# Patient Record
Sex: Male | Born: 1935 | Race: White | Hispanic: No | Marital: Married | Smoking: Former smoker
Health system: Southern US, Community
[De-identification: ages and names within clinical notes are randomized; demographics above are authoritative.]

## PROBLEM LIST (undated history)

## (undated) DIAGNOSIS — R51 Headache: Secondary | ICD-10-CM

## (undated) DIAGNOSIS — G4733 Obstructive sleep apnea (adult) (pediatric): Secondary | ICD-10-CM

## (undated) DIAGNOSIS — E7800 Pure hypercholesterolemia, unspecified: Secondary | ICD-10-CM

## (undated) DIAGNOSIS — F419 Anxiety disorder, unspecified: Secondary | ICD-10-CM

## (undated) DIAGNOSIS — G4730 Sleep apnea, unspecified: Secondary | ICD-10-CM

## (undated) DIAGNOSIS — N138 Other obstructive and reflux uropathy: Secondary | ICD-10-CM

## (undated) DIAGNOSIS — M8580 Other specified disorders of bone density and structure, unspecified site: Secondary | ICD-10-CM

## (undated) DIAGNOSIS — N401 Enlarged prostate with lower urinary tract symptoms: Secondary | ICD-10-CM

## (undated) DIAGNOSIS — K5790 Diverticulosis of intestine, part unspecified, without perforation or abscess without bleeding: Secondary | ICD-10-CM

## (undated) DIAGNOSIS — E291 Testicular hypofunction: Secondary | ICD-10-CM

## (undated) DIAGNOSIS — F1010 Alcohol abuse, uncomplicated: Secondary | ICD-10-CM

## (undated) DIAGNOSIS — Z9989 Dependence on other enabling machines and devices: Principal | ICD-10-CM

## (undated) DIAGNOSIS — F32A Depression, unspecified: Secondary | ICD-10-CM

## (undated) DIAGNOSIS — R519 Headache, unspecified: Secondary | ICD-10-CM

## (undated) DIAGNOSIS — I10 Essential (primary) hypertension: Secondary | ICD-10-CM

## (undated) DIAGNOSIS — M1990 Unspecified osteoarthritis, unspecified site: Secondary | ICD-10-CM

## (undated) DIAGNOSIS — F329 Major depressive disorder, single episode, unspecified: Secondary | ICD-10-CM

## (undated) DIAGNOSIS — K635 Polyp of colon: Secondary | ICD-10-CM

## (undated) HISTORY — DX: Polyp of colon: K63.5

## (undated) HISTORY — DX: Other obstructive and reflux uropathy: N13.8

## (undated) HISTORY — PX: EYE SURGERY: SHX253

## (undated) HISTORY — DX: Sleep apnea, unspecified: G47.30

## (undated) HISTORY — DX: Unspecified osteoarthritis, unspecified site: M19.90

## (undated) HISTORY — DX: Depression, unspecified: F32.A

## (undated) HISTORY — PX: SHOULDER SURGERY: SHX246

## (undated) HISTORY — PX: BACK SURGERY: SHX140

## (undated) HISTORY — PX: TONSILLECTOMY: SUR1361

## (undated) HISTORY — DX: Other specified disorders of bone density and structure, unspecified site: M85.80

## (undated) HISTORY — DX: Testicular hypofunction: E29.1

## (undated) HISTORY — DX: Alcohol abuse, uncomplicated: F10.10

## (undated) HISTORY — DX: Other obstructive and reflux uropathy: N40.1

## (undated) HISTORY — DX: Anxiety disorder, unspecified: F41.9

## (undated) HISTORY — DX: Pure hypercholesterolemia, unspecified: E78.00

## (undated) HISTORY — DX: Major depressive disorder, single episode, unspecified: F32.9

## (undated) HISTORY — DX: Obstructive sleep apnea (adult) (pediatric): G47.33

## (undated) HISTORY — DX: Essential (primary) hypertension: I10

## (undated) HISTORY — DX: Dependence on other enabling machines and devices: Z99.89

## (undated) HISTORY — DX: Diverticulosis of intestine, part unspecified, without perforation or abscess without bleeding: K57.90

---
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
## 1998-10-14 ENCOUNTER — Other Ambulatory Visit: Admission: RE | Admit: 1998-10-14 | Discharge: 1998-10-14 | Payer: Self-pay | Admitting: Urology

## 2001-07-26 ENCOUNTER — Other Ambulatory Visit: Admission: RE | Admit: 2001-07-26 | Discharge: 2001-07-26 | Payer: Self-pay | Admitting: Internal Medicine

## 2001-07-26 ENCOUNTER — Encounter (INDEPENDENT_AMBULATORY_CARE_PROVIDER_SITE_OTHER): Payer: Self-pay | Admitting: Specialist

## 2003-02-23 ENCOUNTER — Ambulatory Visit: Admission: RE | Admit: 2003-02-23 | Discharge: 2003-02-23 | Payer: Self-pay | Admitting: Internal Medicine

## 2005-07-16 ENCOUNTER — Encounter: Admission: RE | Admit: 2005-07-16 | Discharge: 2005-07-16 | Payer: Self-pay | Admitting: Internal Medicine

## 2007-06-17 ENCOUNTER — Ambulatory Visit: Payer: Self-pay | Admitting: Internal Medicine

## 2007-07-01 ENCOUNTER — Ambulatory Visit: Payer: Self-pay | Admitting: Internal Medicine

## 2007-07-01 ENCOUNTER — Encounter: Payer: Self-pay | Admitting: Internal Medicine

## 2007-10-05 ENCOUNTER — Encounter: Admission: RE | Admit: 2007-10-05 | Discharge: 2007-10-05 | Payer: Self-pay | Admitting: Internal Medicine

## 2010-03-05 ENCOUNTER — Inpatient Hospital Stay (HOSPITAL_COMMUNITY): Admission: EM | Admit: 2010-03-05 | Discharge: 2010-03-06 | Payer: Self-pay | Admitting: Diagnostic Neuroimaging

## 2010-03-05 ENCOUNTER — Encounter: Payer: Self-pay | Admitting: Emergency Medicine

## 2010-03-06 ENCOUNTER — Encounter (INDEPENDENT_AMBULATORY_CARE_PROVIDER_SITE_OTHER): Payer: Self-pay | Admitting: Diagnostic Neuroimaging

## 2010-06-27 ENCOUNTER — Encounter: Payer: Self-pay | Admitting: Internal Medicine

## 2010-10-28 NOTE — Letter (Signed)
Summary: Letter regarding colonoscopy/Patient  Letter regarding colonoscopy/Patient   Imported By: Sherian Rein 07/04/2010 09:15:40  _____________________________________________________________________  External Attachment:    Type:   Image     Comment:   External Document

## 2010-12-15 LAB — PROTIME-INR
INR: 1.16 (ref 0.00–1.49)
INR: 1.18 (ref 0.00–1.49)
Prothrombin Time: 14.7 seconds (ref 11.6–15.2)
Prothrombin Time: 14.9 seconds (ref 11.6–15.2)

## 2010-12-15 LAB — APTT
aPTT: 34 seconds (ref 24–37)
aPTT: 34 seconds (ref 24–37)

## 2010-12-15 LAB — LIPID PANEL
Cholesterol: 134 mg/dL (ref 0–200)
HDL: 56 mg/dL (ref >39)
LDL Cholesterol: 56 mg/dL (ref 0–99)
Total CHOL/HDL Ratio: 2.4 RATIO
Triglycerides: 109 mg/dL (ref <150)
VLDL: 22 mg/dL (ref 0–40)

## 2010-12-15 LAB — CK TOTAL AND CKMB (NOT AT ARMC)
CK, MB: 1.6 ng/mL (ref 0.3–4.0)
Relative Index: 1.4 (ref 0.0–2.5)
Total CK: 111 U/L (ref 7–232)

## 2010-12-15 LAB — DIFFERENTIAL
Basophils Absolute: 0 10*3/uL (ref 0.0–0.1)
Basophils Relative: 0 % (ref 0–1)
Eosinophils Absolute: 0.3 10*3/uL (ref 0.0–0.7)
Eosinophils Relative: 4 % (ref 0–5)
Lymphocytes Relative: 21 % (ref 12–46)
Lymphs Abs: 1.5 10*3/uL (ref 0.7–4.0)
Monocytes Absolute: 0.8 10*3/uL (ref 0.1–1.0)
Monocytes Relative: 11 % (ref 3–12)
Neutro Abs: 4.7 10*3/uL (ref 1.7–7.7)
Neutrophils Relative %: 64 % (ref 43–77)

## 2010-12-15 LAB — COMPREHENSIVE METABOLIC PANEL
ALT: 18 U/L (ref 0–53)
ALT: 19 U/L (ref 0–53)
AST: 21 U/L (ref 0–37)
AST: 24 U/L (ref 0–37)
Albumin: 3.6 g/dL (ref 3.5–5.2)
Albumin: 3.7 g/dL (ref 3.5–5.2)
Alkaline Phosphatase: 54 U/L (ref 39–117)
Alkaline Phosphatase: 58 U/L (ref 39–117)
BUN: 11 mg/dL (ref 6–23)
BUN: 12 mg/dL (ref 6–23)
CO2: 27 mEq/L (ref 19–32)
CO2: 30 mEq/L (ref 19–32)
Calcium: 8.6 mg/dL (ref 8.4–10.5)
Calcium: 8.9 mg/dL (ref 8.4–10.5)
Chloride: 97 mEq/L (ref 96–112)
Chloride: 99 mEq/L (ref 96–112)
Creatinine, Ser: 0.87 mg/dL (ref 0.4–1.5)
Creatinine, Ser: 0.95 mg/dL (ref 0.4–1.5)
GFR calc Af Amer: >60 mL/min (ref >60)
GFR calc Af Amer: >60 mL/min (ref >60)
GFR calc non Af Amer: >60 mL/min (ref >60)
GFR calc non Af Amer: >60 mL/min (ref >60)
Glucose, Bld: 180 mg/dL — ABNORMAL HIGH (ref 70–99)
Glucose, Bld: 94 mg/dL (ref 70–99)
Potassium: 3.5 mEq/L (ref 3.5–5.1)
Potassium: 3.7 mEq/L (ref 3.5–5.1)
Sodium: 131 mEq/L — ABNORMAL LOW (ref 135–145)
Sodium: 135 mEq/L (ref 135–145)
Total Bilirubin: 0.7 mg/dL (ref 0.3–1.2)
Total Bilirubin: 0.8 mg/dL (ref 0.3–1.2)
Total Protein: 6.4 g/dL (ref 6.0–8.3)
Total Protein: 6.7 g/dL (ref 6.0–8.3)

## 2010-12-15 LAB — URINALYSIS, MICROSCOPIC ONLY
Bilirubin Urine: NEGATIVE
Glucose, UA: NEGATIVE mg/dL
Ketones, ur: NEGATIVE mg/dL
Leukocytes, UA: NEGATIVE
Nitrite: NEGATIVE
Protein, ur: NEGATIVE mg/dL
Specific Gravity, Urine: 1.01 (ref 1.005–1.030)
Urobilinogen, UA: 1 mg/dL (ref 0.0–1.0)
pH: 7 (ref 5.0–8.0)

## 2010-12-15 LAB — RAPID URINE DRUG SCREEN, HOSP PERFORMED
Amphetamines: NOT DETECTED
Barbiturates: NOT DETECTED
Benzodiazepines: POSITIVE — AB
Cocaine: NOT DETECTED
Opiates: NOT DETECTED
Tetrahydrocannabinol: NOT DETECTED

## 2010-12-15 LAB — HEMOGLOBIN A1C
Hgb A1c MFr Bld: 5.8 % — ABNORMAL HIGH (ref <5.7)
Mean Plasma Glucose: 120 mg/dL — ABNORMAL HIGH (ref <117)

## 2010-12-15 LAB — TROPONIN I: Troponin I: 0.01 ng/mL (ref 0.00–0.06)

## 2010-12-15 LAB — CBC
HCT: 42.1 % (ref 39.0–52.0)
HCT: 42.3 % (ref 39.0–52.0)
Hemoglobin: 14.3 g/dL (ref 13.0–17.0)
Hemoglobin: 14.5 g/dL (ref 13.0–17.0)
MCHC: 33.9 g/dL (ref 30.0–36.0)
MCHC: 34.3 g/dL (ref 30.0–36.0)
MCV: 95.8 fL (ref 78.0–100.0)
MCV: 96.5 fL (ref 78.0–100.0)
Platelets: 233 10*3/uL (ref 150–400)
Platelets: 234 10*3/uL (ref 150–400)
RBC: 4.36 MIL/uL (ref 4.22–5.81)
RBC: 4.42 MIL/uL (ref 4.22–5.81)
RDW: 12.6 % (ref 11.5–15.5)
RDW: 12.9 % (ref 11.5–15.5)
WBC: 6.8 10*3/uL (ref 4.0–10.5)
WBC: 7.4 10*3/uL (ref 4.0–10.5)

## 2010-12-15 LAB — GLUCOSE, CAPILLARY
Glucose-Capillary: 103 mg/dL — ABNORMAL HIGH (ref 70–99)
Glucose-Capillary: 118 mg/dL — ABNORMAL HIGH (ref 70–99)
Glucose-Capillary: 86 mg/dL (ref 70–99)
Glucose-Capillary: 92 mg/dL (ref 70–99)
Glucose-Capillary: 98 mg/dL (ref 70–99)

## 2010-12-15 LAB — TSH: TSH: 2.282 u[IU]/mL (ref 0.350–4.500)

## 2010-12-15 LAB — CARDIAC PANEL(CRET KIN+CKTOT+MB+TROPI)
CK, MB: 2 ng/mL (ref 0.3–4.0)
Relative Index: 1.9 (ref 0.0–2.5)
Total CK: 106 U/L (ref 7–232)
Troponin I: 0.01 ng/mL (ref 0.00–0.06)

## 2010-12-15 LAB — VITAMIN B12: Vitamin B-12: 499 pg/mL (ref 211–911)

## 2010-12-15 LAB — HOMOCYSTEINE: Homocysteine: 7.8 umol/L (ref 4.0–15.4)

## 2011-03-17 ENCOUNTER — Encounter (HOSPITAL_COMMUNITY): Payer: Self-pay

## 2011-03-17 ENCOUNTER — Emergency Department (HOSPITAL_COMMUNITY): Payer: Medicare Other

## 2011-03-17 ENCOUNTER — Emergency Department (HOSPITAL_COMMUNITY)
Admission: EM | Admit: 2011-03-17 | Discharge: 2011-03-17 | Disposition: A | Discharge status: Home / Self Care | Payer: Medicare Other | Attending: Emergency Medicine | Admitting: Emergency Medicine

## 2011-03-17 DIAGNOSIS — W010XXA Fall on same level from slipping, tripping and stumbling without subsequent striking against object, initial encounter: Secondary | ICD-10-CM | POA: Insufficient documentation

## 2011-03-17 DIAGNOSIS — Y92009 Unspecified place in unspecified non-institutional (private) residence as the place of occurrence of the external cause: Secondary | ICD-10-CM | POA: Insufficient documentation

## 2011-03-17 DIAGNOSIS — E785 Hyperlipidemia, unspecified: Secondary | ICD-10-CM | POA: Insufficient documentation

## 2011-03-17 DIAGNOSIS — R220 Localized swelling, mass and lump, head: Secondary | ICD-10-CM | POA: Insufficient documentation

## 2011-03-17 DIAGNOSIS — S0010XA Contusion of unspecified eyelid and periocular area, initial encounter: Secondary | ICD-10-CM | POA: Insufficient documentation

## 2011-03-17 DIAGNOSIS — IMO0002 Reserved for concepts with insufficient information to code with codable children: Secondary | ICD-10-CM | POA: Insufficient documentation

## 2011-03-17 DIAGNOSIS — R221 Localized swelling, mass and lump, neck: Secondary | ICD-10-CM | POA: Insufficient documentation

## 2011-03-17 DIAGNOSIS — M47812 Spondylosis without myelopathy or radiculopathy, cervical region: Secondary | ICD-10-CM | POA: Insufficient documentation

## 2011-04-02 ENCOUNTER — Inpatient Hospital Stay (HOSPITAL_COMMUNITY)
Admission: EM | Admit: 2011-04-02 | Discharge: 2011-04-04 | DRG: 556 | Disposition: A | Discharge status: Home / Self Care | Payer: Medicare Other | Attending: Neurology | Admitting: Neurology

## 2011-04-02 ENCOUNTER — Emergency Department (HOSPITAL_COMMUNITY): Payer: Medicare Other

## 2011-04-02 DIAGNOSIS — G5790 Unspecified mononeuropathy of unspecified lower limb: Secondary | ICD-10-CM | POA: Diagnosis present

## 2011-04-02 DIAGNOSIS — H409 Unspecified glaucoma: Secondary | ICD-10-CM | POA: Diagnosis present

## 2011-04-02 DIAGNOSIS — I10 Essential (primary) hypertension: Secondary | ICD-10-CM | POA: Diagnosis present

## 2011-04-02 DIAGNOSIS — M129 Arthropathy, unspecified: Secondary | ICD-10-CM | POA: Diagnosis present

## 2011-04-02 DIAGNOSIS — R5381 Other malaise: Secondary | ICD-10-CM | POA: Diagnosis present

## 2011-04-02 DIAGNOSIS — E785 Hyperlipidemia, unspecified: Secondary | ICD-10-CM | POA: Diagnosis present

## 2011-04-02 DIAGNOSIS — N400 Enlarged prostate without lower urinary tract symptoms: Secondary | ICD-10-CM | POA: Diagnosis present

## 2011-04-02 DIAGNOSIS — R29898 Other symptoms and signs involving the musculoskeletal system: Principal | ICD-10-CM | POA: Diagnosis present

## 2011-04-02 DIAGNOSIS — Z79899 Other long term (current) drug therapy: Secondary | ICD-10-CM

## 2011-04-02 DIAGNOSIS — F341 Dysthymic disorder: Secondary | ICD-10-CM | POA: Diagnosis present

## 2011-04-02 LAB — POCT I-STAT, CHEM 8
Chloride: 94 mEq/L — ABNORMAL LOW (ref 96–112)
Glucose, Bld: 105 mg/dL — ABNORMAL HIGH (ref 70–99)
HCT: 43 % (ref 39.0–52.0)
Hemoglobin: 14.6 g/dL (ref 13.0–17.0)
Potassium: 3.8 mEq/L (ref 3.5–5.1)
Sodium: 132 mEq/L — ABNORMAL LOW (ref 135–145)

## 2011-04-02 LAB — DIFFERENTIAL
Basophils Absolute: 0 10*3/uL (ref 0.0–0.1)
Basophils Relative: 0 % (ref 0–1)
Eosinophils Absolute: 0.2 10*3/uL (ref 0.0–0.7)
Eosinophils Relative: 2 % (ref 0–5)
Lymphocytes Relative: 25 % (ref 12–46)
Monocytes Absolute: 0.8 10*3/uL (ref 0.1–1.0)

## 2011-04-02 LAB — CBC
HCT: 38.8 % — ABNORMAL LOW (ref 39.0–52.0)
MCHC: 35.6 g/dL (ref 30.0–36.0)
Platelets: 206 10*3/uL (ref 150–400)
RDW: 12.4 % (ref 11.5–15.5)
WBC: 9.2 10*3/uL (ref 4.0–10.5)

## 2011-04-02 LAB — URINALYSIS, ROUTINE W REFLEX MICROSCOPIC
Bilirubin Urine: NEGATIVE
Hgb urine dipstick: NEGATIVE
Ketones, ur: NEGATIVE mg/dL
Nitrite: NEGATIVE
Specific Gravity, Urine: 1.016 (ref 1.005–1.030)
Urobilinogen, UA: 0.2 mg/dL (ref 0.0–1.0)

## 2011-04-02 LAB — TROPONIN I: Troponin I: <0.3 ng/mL (ref <0.30)

## 2011-04-02 LAB — CK TOTAL AND CKMB (NOT AT ARMC)
Relative Index: INVALID (ref 0.0–2.5)
Total CK: 61 U/L (ref 7–232)

## 2011-04-03 ENCOUNTER — Inpatient Hospital Stay (HOSPITAL_COMMUNITY): Payer: Medicare Other

## 2011-04-03 LAB — CK: Total CK: 60 U/L (ref 7–232)

## 2011-04-03 LAB — URINE CULTURE
Culture  Setup Time: 201207052054
Culture: NO GROWTH

## 2011-04-03 LAB — VITAMIN B12: Vitamin B-12: 919 pg/mL — ABNORMAL HIGH (ref 211–911)

## 2011-04-21 NOTE — H&P (Signed)
Lance [name], Lance [name]        ACCOUNT NO.:  [id number]  MEDICAL RECORD NO.:  [id number]  LOCATION:  [facility]                         FACILITY:  [facility]  PHYSICIAN:  Levert Feinstein, MD          DATE OF BIRTH:  [birth year]  DATE OF ADMISSION:  04/02/2011 DATE OF DISCHARGE:                             HISTORY & PHYSICAL   CHIEF COMPLAINT:  Increased gait difficulty.  HISTORY OF PRESENT ILLNESS:  The patient is a pleasant 77 year old right- handed Caucasian male, accompanied by his wife at today's emergency visit, who drove him from home to the emergency room this afternoon.  He has past medical history of hypertension, depression, prostate hypertrophy, and he presented with 6 months history of gradual onset gait difficulty, acute worsening for 3 weeks.  Over the past 6 months, wife noticed the patient had unsteady gait, but still ambulatory without assistance.  Three weeks ago, he tripped and fell, landed on his left frontal forehead to a trash can, and also has left fifth finger fracture.  The bruise on the left periorbital area gradually resolved over the past few weeks, and the family and the patient noticed gradual progressive worsening gait difficulty. Yesterday,  he has trouble walking around a grocery store with his wife. Today, he also has difficulty crossing his legs either to the left or right, but he denies significant bilateral lower extremity paresthesia. There was no incontinence.  He has bilateral lower extremity proximal muscle weakness but symmetric.  Denied difficulty getting up from a seated position.  He has chronic low back pain across midline.  There was no shooting pain to lower extremity.  There was no significant neck pain.  REVIEW OF SYSTEMS:  Pertinent as above.  PAST MEDICAL HISTORY:  Depression, anxiety, hyperlipidemia, hypertension, prostate hypertrophy.  HOME MEDICATIONS: 1. Advicor 100/40 mg at bedtime. 2. Bupropion 150 mg twice a day. 3. Combigan  eye drop twice a day. 4. Diazepam 10 mg 3 times a day p.r.n. 5. Doxepin 50 mg at bedtime. 6. Hydrocodone/Tylenol 10/500 as needed. 7. Tamsulosin 0.4 mg twice a day. 8. Tramadol 50 mg as needed. 9. Triamterene/hydrochlorothiazide 37.5/25 once a day. 10.Vitamin D 50,000 units every day.  ALLERGIES:  PENICILLIN, cause anaphylactic shock.  SURGICAL HISTORY:  He had right shoulder surgery, appendectomy.  FAMILY HISTORY:  Father and mother, all died in their 75s for unknown reasons.  Sister suffers scoliosis.  He has three children.  SOCIAL HISTORY:  He is retired, lives with his wife, ambulatory without assistance prior to the hospital, exercise twice a week, and still independent in his living.  PHYSICAL EXAMINATION:  VITAL SIGNS:  Temperature was 98.6, blood pressure 138/81, heart rate of 92. CARDIAC:  Regular rate and rhythm. NEUROLOGIC:  He is alert, oriented to history taking, carry off conversation.  Cranial nerves II-XII.  Pupil equal, round, reactive to light.  Extraocular movements were full.  Facial sensation strength was normal.  Uvula and tongue midline.  Head turning, shoulder shrugging normal and symmetric.  Tongue protrusion into cheek.  Strength was normal.  Visual fields were full on confrontational test.  Motor examination, left wrist is in splint due to left fifth finger fracture. Normal tone, bark, and strength  on bilateral upper extremity.  I do not see any significant weakness on bilateral lower extremity, proximal and distal muscles on manual strength testing.  Sensory, there was lens dependent.  Decreased pinprick to above ankle level, decreased vibratory sensation to knee level, preserved proprioception at bilateral toes. Deep tendon reflex hypoactive but with preserved-to-trace upper extremity patellar reflex.  Plantar responses were flexor.  There was no finger-to-nose, heel-to-shin dysmetria, but he does has difficulty crossing his leg.  Gait, he was able  to get up from seated position arm crossed but with few attempts.  Wide-based, unsteady, cautious gait. Difficulty standing up on tip toe and heels.  Romberg sign was negative.  CT of the head without contrast has demonstrated atrophy, periventricular small-vessel disease, but no acute lesion.  Previous MRI on March 05, 2010, initiated for transient speech difficulty showed similar findings, atrophy, periventricular white matter disease but there was no acute infarction.  MRA of the brain was normal at that time.  Echocardiogram on March 06, 2010, has demonstrated ejection fraction of 50-55, diastolic dysfunction.  Today's laboratory evaluation.  Urinalysis was negative.  Troponin was negative and CPK was 61.  Normal BMP with the exception of mildly low potassium at 3.8, low sodium 138.  CBC was within normal limits.  ASSESSMENT AND PLAN:  A 77 year old male with past medical history of hypertension, hyperlipidemia, knee pain, prostate hypertrophy, chronic gait difficulty for 6 months, increased since his fall 3 weeks ago. There was no significant muscle weakness.  There is lens-dependent sensory change, hyporeflexia, wide-based unsteady gait.  Differentiation diagnosis including deconditioning, need to rule out cervical lumbar compression lesions, it is less likely Guillain-Barre syndrome because of lack of sensory complaints.  1. MRI of cervical and lumbar spine 2. PT/OT. 3. Start home medication.     Levert Feinstein, MD     YY/MEDQ  D:  04/02/2011  T:  04/02/2011  Job:  [id number]  Electronically Signed by Levert Feinstein MD on 04/21/2011 08:34:31 AM

## 2011-04-23 NOTE — Discharge Summary (Signed)
Lance [name], Lance [name]        ACCOUNT NO.:  [id number]  MEDICAL RECORD NO.:  [id number]  LOCATION:  3012                         FACILITY:  [facility]  PHYSICIAN:  Thana Farr, MD    DATE OF BIRTH:  Oct 05, 1931  DATE OF ADMISSION:  04/02/2011 DATE OF DISCHARGE:  04/04/2011                              DISCHARGE SUMMARY   DISCHARGE DIAGNOSES: 1. Lower extremity weakness, resolved. 2. Peripheral neuropathy, bilateral lower extremities. 3. Generalized age-related debility. 4. Hypertension. 5. Hyperlipidemia. 6. Prostatic hypertrophy. 7. Depression. 8. Anxiety. 9. Multijoint arthritic pain. 10.Glaucoma.  DISCHARGE MEDICATIONS: 1. Advicor 1000/40 one p.o. nightly. 2. Bupropion SR 150 mg one p.o. b.i.d. 3. Combigan ophthalmic solution 0.5% 1 drop OU b.i.d. 4. Diazepam 10 mg 1/2 to 1 tablet p.o. t.i.d. p.r.n. anxiety. 5. Doxepin 50 mg one p.o. nightly. 6. Tamsulosin 0.4 mg 2 capsules by mouth each morning. 7. Triamterene/hydrochlorothiazide 37.5/25 mg one p.o. q.a.m. 8. Vitamin D2 50,000 units by mouth weekly. 9. Hydrocodone 10/500 one p.o. q.4 hours p.r.n. pain. 10.Tramadol 50 mg one p.o. t.i.d. p.r.n. pain.  HISTORY OF PRESENT ILLNESS:  This is a 77 year old right-handed white male who was admitted to [facility] on April 02, 2011, because of increased gait difficulty.  For the last 6 months, his wife had noticed he had unsteady gait.  Three weeks prior to admission, he fell hitting his left forehead and sustained a left fifth finger fracture.  Since that fall, the family and the patient who has gradual progressive worsening gait disturbance.  The day prior to admission, he had trouble walking around the grocery store with this wife.  On the date of admission, he had a hard time standing up and crossing his legs.  He denied significant lower extremity paresthesia or incontinence.  He denied difficulty getting up from a seated position.  He does report a history of chronic  low back pain without radiculopathy.  On examination in the emergency room, there was no significant muscle weakness, although he did have some sensory changes, hyporeflexia and wide-based unsteady gait.  CT of the head without contrast showed atrophy, periventricular small vessel disease but no acute lesion.  The patient was thus admitted for further evaluation of gait instability.  COURSE IN HOSPITAL:  The patient was admitted for evaluation of generalized lower extremity weakness and gait difficulty.  He was evaluated by Physical Therapy.  Their evaluations showed the patient to be independent with bed mobility, sit to stand and transferring.  He did have some mild balance deficits, putting a very mild risk for loss of balance.  Continued inpatient physical therapy was not indicated, although outpatient physical therapy for strengthening and balance training is recommended.  MRI of the head on July 6 showed no infarct, bleed or mass.  He had mild white matter disease and atrophy.  Dolichoectasia of basilar artery was noted.  MRI of the C-spine on July 6 showed no canal stenosis or cord pathology.  Did have some narrowing at the right C3-4 and C4-5 that could contribute possible neural compression and MRI of the L-spine on July 6 showed scoliosis and multilevel degenerative changes.  At L4-5, he had right greater than left narrowing of the lateral recess, which could possibly  cause neuro irritation.  LABORATORY STUDIES:  A WBC of 9.2, hemoglobin 13.8, and platelet of 206. Sodium 132, potassium 3.8, BUN 17, creatinine 1.10.  Ionized calcium 1.12.  Cardiac enzymes were negative.  UA was negative.  Sed rate was normal at 3.  C-reactive protein was normal at 0.1 and RPR screen was negative.  The patient actually had improved mobility, subjectively with no sensation of weakness during his hospitalization.  Gait does remained wide based with good lifting of the feet and multi-step  pivoting.  He does ambulate slightly slumped over.  His strength is 5/5 and finger-to- nose and heel-to-shin were smooth.  It was felt that the patient has multifactorial lower extremity weakness with no clear neurologic etiology.  Suspect a peripheral neuropathy and generalized debility as the cause of his symptoms.  Would recommend outpatient physical therapy for gait training and balance as well as outpatient followup with [facility] neurologic.  We will discharge the patient to home today.     Luan Moore, P.A.   ______________________________ Thana Farr, MD    TCJ/MEDQ  D:  04/04/2011  T:  04/04/2011  Job:  [id number]  cc:   [facility] neurologic  Electronically Signed by Delice Bison JERNEJCIC P.A. on 04/19/2011 07:34:29 AM Electronically Signed by Thana Farr MD on 04/23/2011 01:22:39 PM

## 2011-05-04 ENCOUNTER — Ambulatory Visit: Payer: Medicare Other | Attending: Neurology | Admitting: Physical Therapy

## 2011-05-04 DIAGNOSIS — IMO0001 Reserved for inherently not codable concepts without codable children: Secondary | ICD-10-CM | POA: Insufficient documentation

## 2011-05-04 DIAGNOSIS — R269 Unspecified abnormalities of gait and mobility: Secondary | ICD-10-CM | POA: Insufficient documentation

## 2011-05-06 ENCOUNTER — Ambulatory Visit: Payer: Medicare Other | Admitting: Physical Therapy

## 2011-05-08 ENCOUNTER — Ambulatory Visit (HOSPITAL_COMMUNITY)
Admission: RE | Admit: 2011-05-08 | Discharge: 2011-05-08 | Disposition: A | Discharge status: Home / Self Care | Payer: Medicare Other | Source: Ambulatory Visit | Attending: Internal Medicine | Admitting: Internal Medicine

## 2011-05-08 DIAGNOSIS — R0602 Shortness of breath: Secondary | ICD-10-CM | POA: Insufficient documentation

## 2011-05-08 LAB — BLOOD GAS, ARTERIAL
Bicarbonate: 25.8 mEq/L — ABNORMAL HIGH (ref 20.0–24.0)
FIO2: 0.21 %
TCO2: 27 mmol/L (ref 0–100)
pH, Arterial: 7.428 (ref 7.350–7.450)
pO2, Arterial: 70.7 mmHg — ABNORMAL LOW (ref 80.0–100.0)

## 2011-05-12 ENCOUNTER — Ambulatory Visit: Payer: Medicare Other | Admitting: Physical Therapy

## 2011-05-14 ENCOUNTER — Ambulatory Visit: Payer: Medicare Other | Admitting: Physical Therapy

## 2011-05-19 ENCOUNTER — Ambulatory Visit: Payer: Medicare Other | Admitting: Physical Therapy

## 2011-05-21 ENCOUNTER — Ambulatory Visit: Payer: Medicare Other | Admitting: Physical Therapy

## 2011-10-09 ENCOUNTER — Encounter: Payer: Self-pay | Admitting: Internal Medicine

## 2011-10-26 ENCOUNTER — Other Ambulatory Visit (INDEPENDENT_AMBULATORY_CARE_PROVIDER_SITE_OTHER): Payer: Medicare Other

## 2011-10-26 ENCOUNTER — Encounter: Payer: Self-pay | Admitting: Internal Medicine

## 2011-10-26 ENCOUNTER — Ambulatory Visit (INDEPENDENT_AMBULATORY_CARE_PROVIDER_SITE_OTHER): Payer: Medicare Other | Admitting: Internal Medicine

## 2011-10-26 VITALS — BP 106/66 | HR 80 | Ht 74.0 in | Wt 224.0 lb

## 2011-10-26 DIAGNOSIS — K5900 Constipation, unspecified: Secondary | ICD-10-CM

## 2011-10-26 DIAGNOSIS — Z86010 Personal history of colonic polyps: Secondary | ICD-10-CM

## 2011-10-26 DIAGNOSIS — R1032 Left lower quadrant pain: Secondary | ICD-10-CM

## 2011-10-26 LAB — BASIC METABOLIC PANEL
BUN: 24 mg/dL — ABNORMAL HIGH (ref 6–23)
CO2: 29 mEq/L (ref 19–32)
Chloride: 98 mEq/L (ref 96–112)
Glucose, Bld: 91 mg/dL (ref 70–99)
Potassium: 4.3 mEq/L (ref 3.5–5.1)
Sodium: 135 mEq/L (ref 135–145)

## 2011-10-26 MED ORDER — PEG-KCL-NACL-NASULF-NA ASC-C 100 G PO SOLR: 1.0000 | Freq: Once | ORAL | Status: DC

## 2011-10-26 NOTE — Patient Instructions (Addendum)
Your physician has requested that you go to the basement for lab work before leaving today  You have been scheduled for a colonoscopy with propofol. Please follow written instructions given to you at your visit today.  Please pick up your prep kit at the pharmacy within the next 2-3 days.   You have been scheduled for a CT scan of the abdomen and pelvis at [facility] ([address]---this is in the same building as [facility]ural [facility]).   You are scheduled on 10-29-11 at 1:30pm. You should arrive 15 minutes prior to your appointment time for registration. Please follow the written instructions below on the day of your exam:  WARNING: IF YOU ARE ALLERGIC TO IODINE/X-RAY DYE, PLEASE NOTIFY RADIOLOGY IMMEDIATELY AT [phone]! YOU WILL BE GIVEN A 13 HOUR PREMEDICATION PREP.  1) Do not eat or drink anything after 9:30am (4 hours prior to your test) 2) You have been given 2 bottles of oral contrast to drink. The solution may taste better if refrigerated, but do NOT add ice or any other liquid to this solution. Shake well before drinking.    Drink 1 bottle of contrast @ 11:30am (2 hours prior to your exam)  Drink 1 bottle of contrast @ 12:30pm (1 hour prior to your exam)  You may take any medications as prescribed with a small amount of water except for the following: Metformin, Glucophage, Glucovance, Avandamet, Riomet, Fortamet, Actoplus Met, Janumet, Glumetza or Metaglip. The above medications must be held the day of the exam AND 48 hours after the exam.  The purpose of you drinking the oral contrast is to aid in the visualization of your intestinal tract. The contrast solution may cause some diarrhea. Before your exam is started, you will be given a small amount of fluid to drink. Depending on your individual set of symptoms, you may also receive an intravenous injection of x-ray contrast/dye. Plan on being at [facility] for 30 minutes or long, depending on the type of exam  you are having performed.  If you have any questions regarding your exam or if you need to reschedule, you may call the CT department at [phone] between the hours of 8:00 am and 5:00 pm, Monday-Friday.  Please use your stool softeners more frequently  ________________________________________________________________________

## 2011-10-26 NOTE — Progress Notes (Signed)
HISTORY OF PRESENT ILLNESS:  Lance [name] is a 78 y.o. male with multiple significant medical problems as listed below. He presents today regarding a several month history of new onset abdominal pain. He reports moderate left lower quadrant abdominal pain which is exacerbated by direct palpation. Somewhat relieved with defecation, though still with soreness. Some radiation into the back. Some symptoms on the right, though less frequent. He does have chronic constipation. Take stool softeners about once every other week. Moves his bowels about once every other day. No weight loss or bleeding. Symptoms occasionally worse after meals. He has undergone multiple prior colonoscopies for a history of adenomatous colon polyps. Last exam in October 2008. Due for followup the shear. He also has left-sided diverticulosis. He is accompanied by his wife.  REVIEW OF SYSTEMS:  All non-GI ROS negative except for urinary difficulties, hearing problems, allergy, anxiety, back pain, arthritis, heart murmur  Past Medical History  Diagnosis Date  . Osteoarthritis   . Hypogonadism male   . BPH (benign prostatic hypertrophy) with urinary obstruction   . Hypertension   . Osteopenia   . Vitamin D deficiency   . Anxiety   . Depression   . Diverticulosis   . Colon polyps   . Hypercholesterolemia   . Glaucoma   . Sleep apnea   . Alcohol abuse     stopped in 1970  . History of pneumonia     Past Surgical History  Procedure Date  . Tonsillectomy   . Appendectomy 1945  . Shoulder surgery     rt    Social History Lance [name]  reports that he quit smoking about 49 years ago. He has never used smokeless tobacco. He reports that he does not drink alcohol or use illicit drugs.  family history is not on file.  Allergies  Allergen Reactions  . Penicillins Anaphylaxis       PHYSICAL EXAMINATION: Vital signs: BP 106/66  Pulse 80  Ht 6\' 2"  (1.88 m)  Wt 224 lb (101.606 kg)  BMI 28.76  kg/m2  Constitutional: elderly,generally well-appearing, no acute distress Psychiatric: alert and oriented x3, cooperative Eyes: extraocular movements intact, anicteric, conjunctiva pink Mouth: oral pharynx moist, no lesions Neck: supple no lymphadenopathy Cardiovascular: heart regular rate and rhythm, no murmur Lungs: clear to auscultation bilaterally Abdomen: soft,obese, tender in the left lower quadrant with deep palpation, no obvious mass, nondistended, no obvious ascites, no peritoneal signs, normal bowel sounds, no organomegaly Rectal:deferred until colonoscopy Extremities: no lower extremity edema bilaterally Skin: no lesions on visible extremities Neuro: No focal deficits.   ASSESSMENT:  #1. New onset left lower quadrant discomfort, into a greater extent right lower quadrant discomfort. Symptoms are nonspecific. May be related to bowel irregularity. #2. History of adenomatous colon polyps. Due for followup fissure #3. Multiple medical problems #4. Chronic constipation   PLAN:  #1. Instructed to increase the frequency of stool softeners usage, to achieve once daily bowel movements. This may help his discomfort. #2. Schedule contrast enhanced CT scan of the abdomen and pelvis to further evaluate pain #3. If CT negative, proceed with surveillance colonoscopy.The nature of the procedure, as well as the risks, benefits, and alternatives were carefully and thoroughly reviewed with the patient. Ample time for discussion and questions allowed. The patient understood, was satisfied, and agreed to proceed. Movi prep prescribed. The patient instructed on its use. The patient is higher than baseline risk given his comorbidities. CRNA supervise propofol administration recommended for his sedation  Impressions and plans discussed  with patient and wife

## 2011-10-27 ENCOUNTER — Encounter: Payer: Self-pay | Admitting: Internal Medicine

## 2011-10-29 ENCOUNTER — Ambulatory Visit (INDEPENDENT_AMBULATORY_CARE_PROVIDER_SITE_OTHER)
Admission: RE | Admit: 2011-10-29 | Discharge: 2011-10-29 | Disposition: A | Discharge status: Home / Self Care | Payer: Medicare Other | Source: Ambulatory Visit | Attending: Internal Medicine | Admitting: Internal Medicine

## 2011-10-29 ENCOUNTER — Telehealth: Payer: Self-pay

## 2011-10-29 DIAGNOSIS — K5900 Constipation, unspecified: Secondary | ICD-10-CM

## 2011-10-29 DIAGNOSIS — Z86010 Personal history of colonic polyps: Secondary | ICD-10-CM

## 2011-10-29 DIAGNOSIS — R1032 Left lower quadrant pain: Secondary | ICD-10-CM

## 2011-10-29 MED ORDER — IOHEXOL 300 MG/ML  SOLN
100.0000 mL | Freq: Once | INTRAMUSCULAR | Status: AC | PRN
  Administered 2011-10-29: 100 mL via INTRAVENOUS

## 2011-10-29 NOTE — Telephone Encounter (Signed)
Pt aware.

## 2011-10-29 NOTE — Telephone Encounter (Signed)
Message copied by Michele Mcalpine on Thu Oct 29, 2011  3:37 PM ------      Message from: Hilarie Fredrickson      Created: Thu Oct 29, 2011  3:37 PM       Let pt know that CT is fine. Keep plans for colonoscopy

## 2011-11-23 ENCOUNTER — Encounter: Payer: Self-pay | Admitting: Internal Medicine

## 2011-11-23 ENCOUNTER — Ambulatory Visit (AMBULATORY_SURGERY_CENTER): Payer: Medicare Other | Admitting: Internal Medicine

## 2011-11-23 VITALS — BP 134/86 | HR 98 | Temp 97.2°F | Resp 20 | Ht 74.0 in | Wt 224.0 lb

## 2011-11-23 DIAGNOSIS — D126 Benign neoplasm of colon, unspecified: Secondary | ICD-10-CM

## 2011-11-23 DIAGNOSIS — Z1211 Encounter for screening for malignant neoplasm of colon: Secondary | ICD-10-CM

## 2011-11-23 DIAGNOSIS — Z86010 Personal history of colonic polyps: Secondary | ICD-10-CM

## 2011-11-23 MED ORDER — SODIUM CHLORIDE 0.9 % IV SOLN: 500.0000 mL | INTRAVENOUS | Status: DC

## 2011-11-23 NOTE — Patient Instructions (Signed)
1- small polyp removed and sent to pathology Moderate diverticulosis Otherwise normal GI follow up as needed     YOU HAD AN ENDOSCOPIC PROCEDURE TODAY AT THE [facility]: Refer to the procedure report that was given to you for any specific questions about what was found during the examination.  If the procedure report does not answer your questions, please call your gastroenterologist to clarify.  If you requested that your care partner not be given the details of your procedure findings, then the procedure report has been included in a sealed envelope for you to review at your convenience later.  YOU SHOULD EXPECT: Some feelings of bloating in the abdomen. Passage of more gas than usual.  Walking can help get rid of the air that was put into your GI tract during the procedure and reduce the bloating. If you had a lower endoscopy (such as a colonoscopy or flexible sigmoidoscopy) you may notice spotting of blood in your stool or on the toilet paper. If you underwent a bowel prep for your procedure, then you may not have a normal bowel movement for a few days.  DIET: Your first meal following the procedure should be a light meal and then it is ok to progress to your normal diet.  A half-sandwich or bowl of soup is an example of a good first meal.  Heavy or fried foods are harder to digest and may make you feel nauseous or bloated.  Likewise meals heavy in dairy and vegetables can cause extra gas to form and this can also increase the bloating.  Drink plenty of fluids but you should avoid alcoholic beverages for 24 hours.  ACTIVITY: Your care partner should take you home directly after the procedure.  You should plan to take it easy, moving slowly for the rest of the day.  You can resume normal activity the day after the procedure however you should NOT DRIVE or use heavy machinery for 24 hours (because of the sedation medicines used during the test).    SYMPTOMS TO REPORT IMMEDIATELY: A  gastroenterologist can be reached at any hour.  During normal business hours, 8:30 AM to 5:00 PM Monday through Friday, call [phone].  After hours and on weekends, please call the GI answering service at [phone] who will take a message and have the physician on call contact you.   Following lower endoscopy (colonoscopy or flexible sigmoidoscopy):  Excessive amounts of blood in the stool  Significant tenderness or worsening of abdominal pains  Swelling of the abdomen that is new, acute  Fever of 100F or higher  FOLLOW UP: If any biopsies were taken you will be contacted by phone or by letter within the next 1-3 weeks.  Call your gastroenterologist if you have not heard about the biopsies in 3 weeks.  Our staff will call the home number listed on your records the next business day following your procedure to check on you and address any questions or concerns that you may have at that time regarding the information given to you following your procedure. This is a courtesy call and so if there is no answer at the home number and we have not heard from you through the emergency physician on call, we will assume that you have returned to your regular daily activities without incident.  SIGNATURES/CONFIDENTIALITY: You and/or your care partner have signed paperwork which will be entered into your electronic medical record.  These signatures attest to the fact that that the  information above on your After Visit Summary has been reviewed and is understood.  Full responsibility of the confidentiality of this discharge information lies with you and/or your care-partner.

## 2011-11-23 NOTE — Progress Notes (Signed)
Patient did not experience any of the following events: a burn prior to discharge; a fall within the facility; wrong site/side/patient/procedure/implant event; or a hospital transfer or hospital admission upon discharge from the facility. (G8907) Patient did not have preoperative order for IV antibiotic SSI prophylaxis. (G8918)  

## 2011-11-23 NOTE — Op Note (Signed)
[facility] [address] [place], [place]  [address]  COLONOSCOPY PROCEDURE REPORT  PATIENT:  Lance [name], Lance [name]  MR#:  [id number] BIRTHDATE:  05/12/1932, 80 yrs. old  GENDER:  male ENDOSCOPIST:  Wilhemina Bonito. Eda Keys, MD REF. BY:  Surveillance Program Recall, PROCEDURE DATE:  11/23/2011 PROCEDURE:  Colonoscopy with snare polypectomy X 1 ASA CLASS:  Class III INDICATIONS:  history of pre-cancerous (adenomatous) colon polyps, surveillance and high-risk screening ; INDEX EXAM 2002; F/U 2005, AND 2008... abdominal discomfort improved since O.V. MEDICATIONS:   MAC sedation, administered by CRNA, propofol (Diprivan) 250 mg IV  DESCRIPTION OF PROCEDURE:   After the risks benefits and alternatives of the procedure were thoroughly explained, informed consent was obtained.  Digital rectal exam was performed and revealed no abnormalities.   The LB 180AL [id number] endoscope was introduced through the anus and advanced to the cecum, which was identified by both the appendix and ileocecal valve, without limitations.  The quality of the prep was adequate, using MoviPrep.  The instrument was then slowly withdrawn as the colon was fully examined. <<PROCEDUREIMAGES>>  FINDINGS:  A diminutive polyp was found in the transverse colon and snared without cautery. Retrieval was successful. Moderate diverticulosis was found in the left colon.  Otherwise normal colonoscopy without other polyps, masses, vascular ectasias, or inflammatory changes.   Retroflexed views in the rectum revealed internal hemorrhoids.    The time to cecum = 3:45  minutes. The scope was then withdrawn in  14:21  minutes from the cecum and the procedure completed.  COMPLICATIONS:  None  ENDOSCOPIC IMPRESSION: 1) Diminutive polyp in the transverse colon - removed 2) Moderate diverticulosis in the left colon 3) Otherwise normal colonoscopy  RECOMMENDATIONS: 1) Return to the care of your primary provider. GI follow up  as needed  ______________________________ Wilhemina Bonito. Eda Keys, MD  CC:  Rodrigo Ran, MD;    The Patient  n. eSIGNED:   Wilhemina Bonito. Eda Keys at 11/23/2011 12:22 PM  Sarita Haver, [phone]

## 2011-11-24 ENCOUNTER — Telehealth: Payer: Self-pay | Admitting: *Deleted

## 2011-11-24 NOTE — Telephone Encounter (Signed)
  Follow up Call-  Call back number 11/23/2011  Post procedure Call Back phone  # [phone]  Permission to leave phone message Yes     Patient questions:  Do you have a fever, pain , or abdominal swelling? no   Have you tolerated food without any problems? yes  Have you been able to return to your normal activities? yes  Do you have any questions about your discharge instructions: Diet   no Medications  no Follow up visit  no  Do you have questions or concerns about your Care? no  Actions:

## 2011-11-30 ENCOUNTER — Encounter: Payer: Self-pay | Admitting: Internal Medicine

## 2012-09-24 IMAGING — CT CT HEAD W/O CM
4 of 8 series · 17 of 47 positions shown, 19 images · non-contrast
Comparison: Head CT scan 03/05/2010.

CT HEAD

CLINICAL DATA: Status post fall with a blow to the left side of
the face.  Swelling and pain.

CT HEAD WITHOUT CONTRAST
CT MAXILLOFACIAL WITHOUT CONTRAST
CT CERVICAL SPINE WITHOUT CONTRAST
TECHNIQUE: Multidetector CT imaging of the head, cervical spine,
and maxillofacial structures were performed using the standard
protocol without intravenous contrast. Multiplanar CT image
reconstructions of the cervical spine and maxillofacial structures
were also generated.

[Series 9: orbit 2.0 h32s · axial · 0.35mm/px · z∈[-122,-28]mm · 5 of 83 slices shown]
[im 12/83  brain]
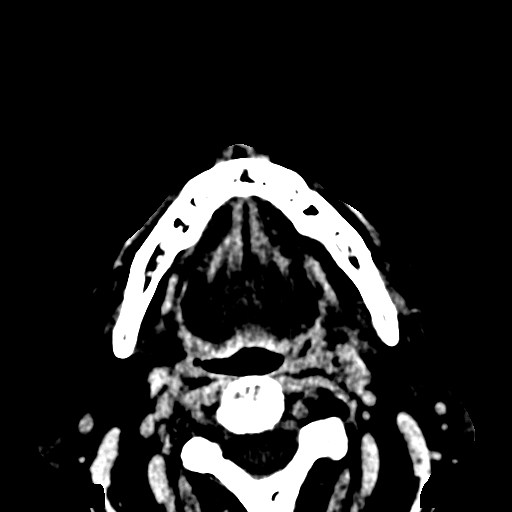
[im 24/83  brain]
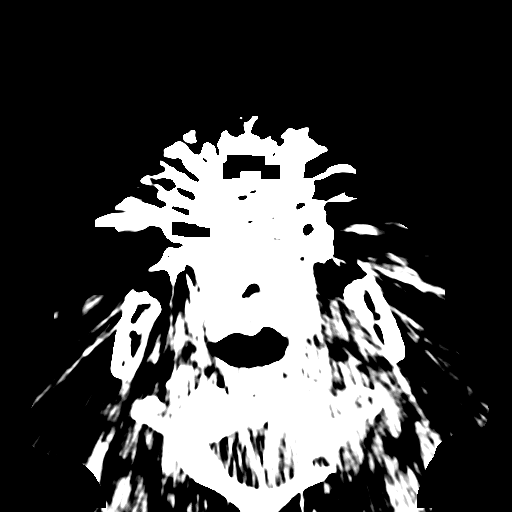
[im 36/83  brain]
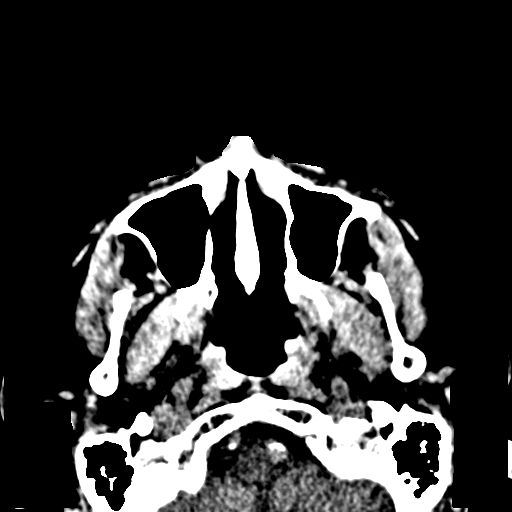
[im 47/83  brain]
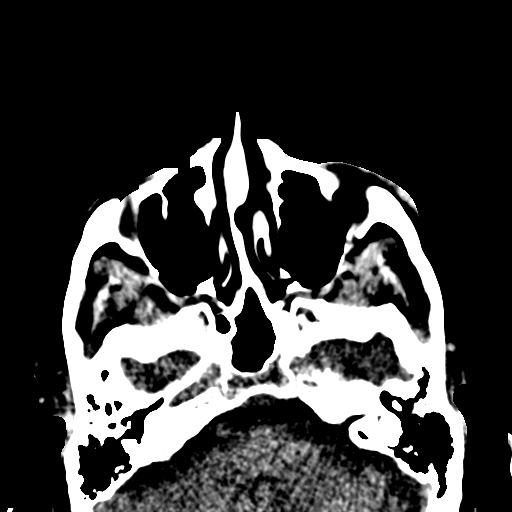
[im 59/83  brain]
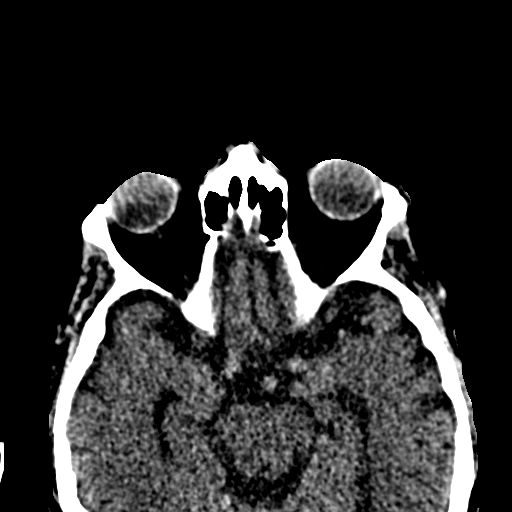

[Series 604: <mpr thick range(2)> · axial · 0.35mm/px · z∈[-249,-134]mm · 7 of 86 slices shown, 9 images]
[im 11/86  brain]
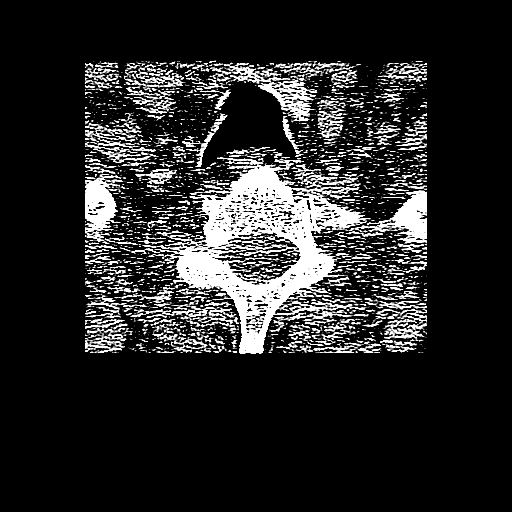
[im 11/86  bone]
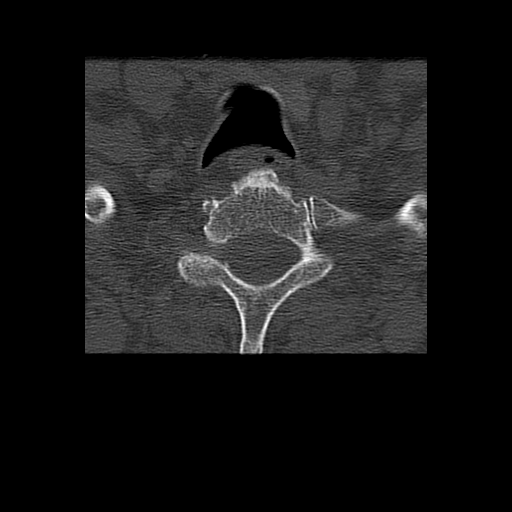
[im 22/86  brain]
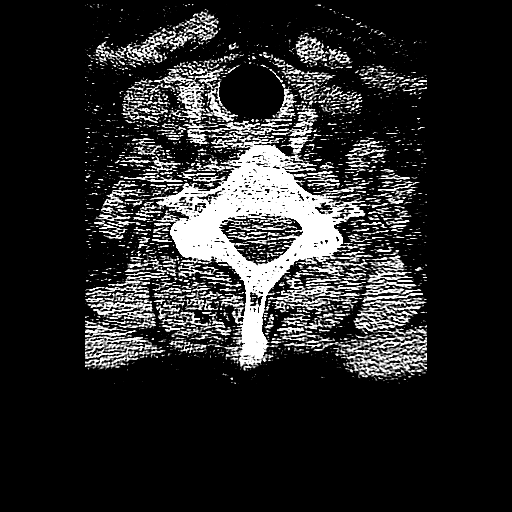
[im 32/86  brain]
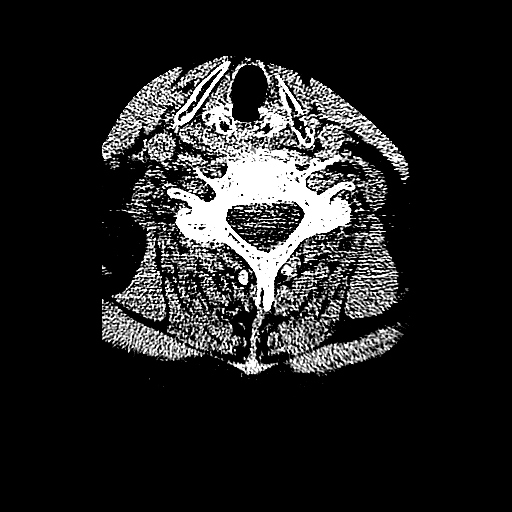
[im 43/86  brain]
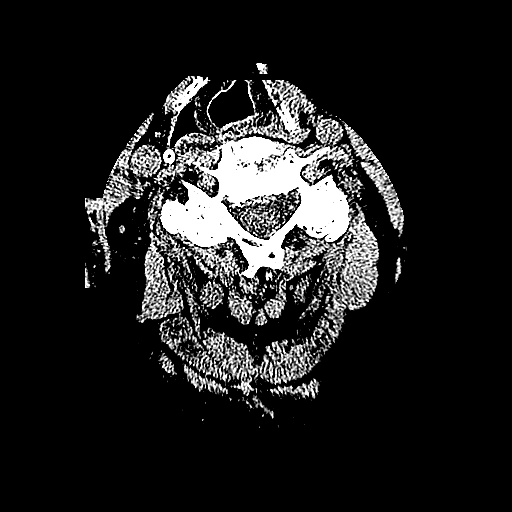
[im 54/86  brain]
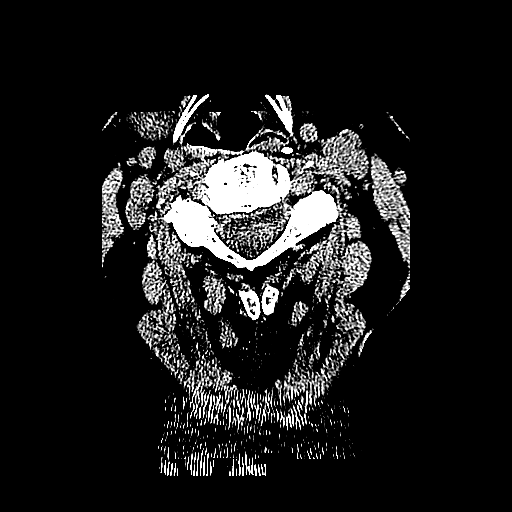
[im 54/86  bone]
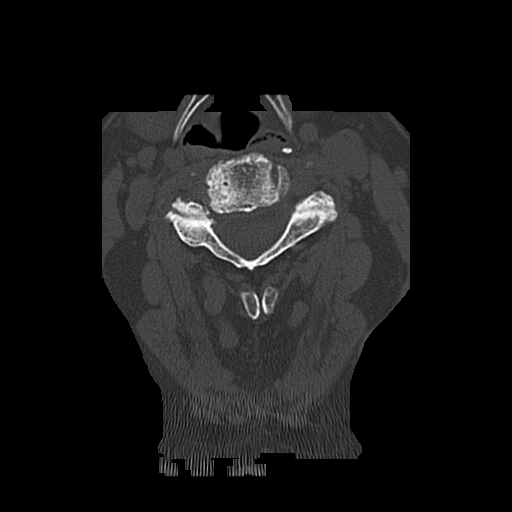
[im 64/86  brain]
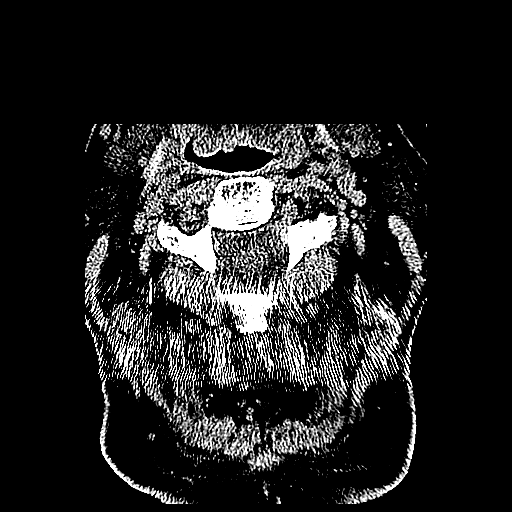
[im 75/86  brain]
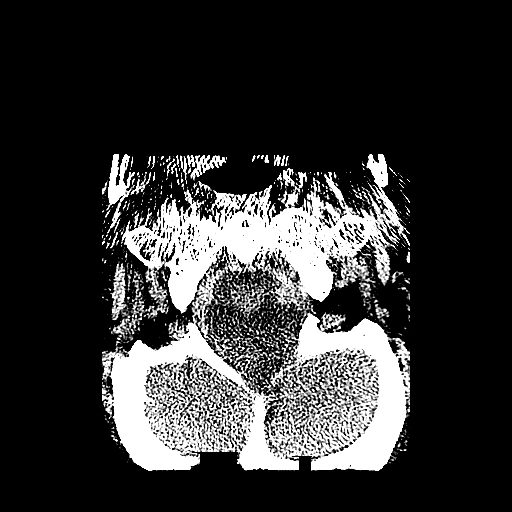

[Series 605: <mpr thick range(3)> · coronal · 0.35mm/px · 3 of 66 slices shown]
[im 22/66  brain]
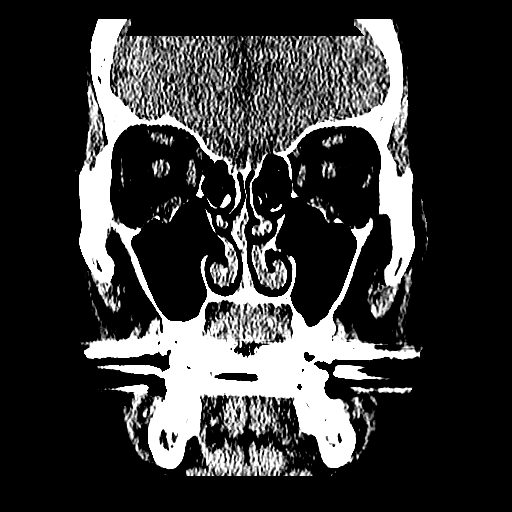
[im 33/66  brain]
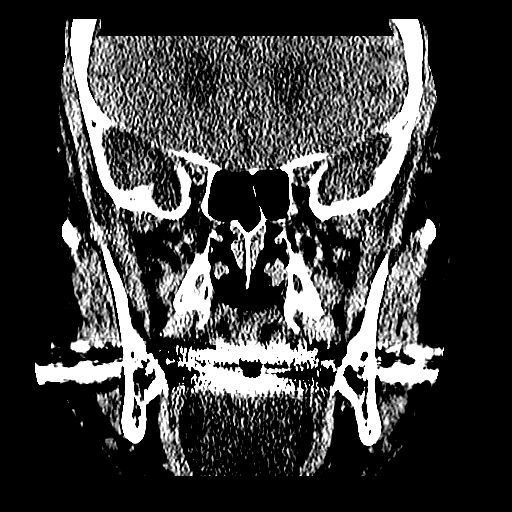
[im 44/66  brain]
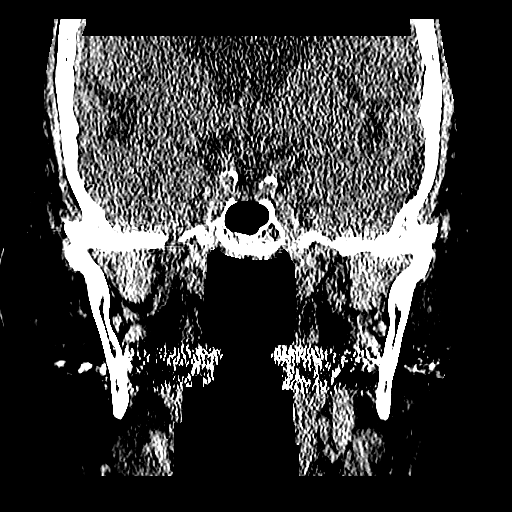

[Series 606: <mpr thick range(4)> · sagittal · 0.35mm/px · 2 of 77 slices shown]
[im 26/77  brain]
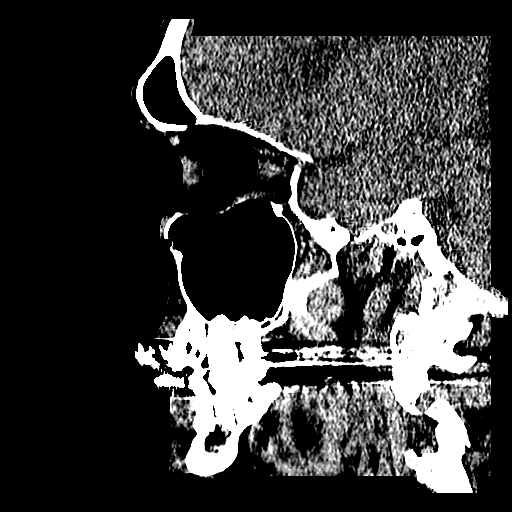
[im 51/77  brain]
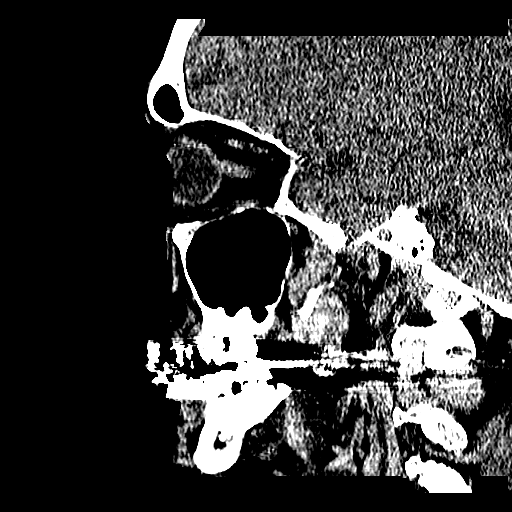

[17 of 47 positions shown; findings below may reference images not displayed]

FINDINGS: There is cortical atrophy and chronic microvascular
ischemic change.  No evidence of acute infarction, hemorrhage, mass
lesion, mass effect, midline shift or abnormal extra-axial fluid
collection is identified.  There is no hydrocephalus.  No
pneumocephalus.  Calvarium intact.
IMPRESSION: No acute finding.

CT MAXILLOFACIAL
FINDINGS: Soft tissue contusion is seen about the left eye.  The
globes are intact and the lenses are located.  No facial bone
fracture is seen.  Mandibular condyles are located.  Visualized
paranasal sinuses and mastoid air cells are clear.
IMPRESSION: Hematoma about the left eye without underlying fracture or other
acute abnormality.

CT CERVICAL SPINE
FINDINGS: There is no fracture or subluxation of the cervical
spine.  The patient has multilevel degenerative change with loss of
disc space height worst at C3-4 and C4-5.  Advanced multilevel
facet arthropathy is noted.  Lung apices are clear.
IMPRESSION: No acute finding.  Cervical spondylosis.

## 2013-06-03 ENCOUNTER — Emergency Department (HOSPITAL_COMMUNITY): Payer: Medicare Other

## 2013-06-03 ENCOUNTER — Inpatient Hospital Stay (HOSPITAL_COMMUNITY): Payer: Medicare Other

## 2013-06-03 ENCOUNTER — Encounter (HOSPITAL_COMMUNITY): Payer: Self-pay | Admitting: Internal Medicine

## 2013-06-03 ENCOUNTER — Inpatient Hospital Stay (HOSPITAL_COMMUNITY)
Admission: EM | Admit: 2013-06-03 | Discharge: 2013-06-05 | DRG: 066 | Disposition: A | Discharge status: Home Health | Payer: Medicare Other | Attending: Internal Medicine | Admitting: Internal Medicine

## 2013-06-03 DIAGNOSIS — E7800 Pure hypercholesterolemia, unspecified: Secondary | ICD-10-CM | POA: Diagnosis present

## 2013-06-03 DIAGNOSIS — I6350 Cerebral infarction due to unspecified occlusion or stenosis of unspecified cerebral artery: Principal | ICD-10-CM | POA: Diagnosis present

## 2013-06-03 DIAGNOSIS — F3289 Other specified depressive episodes: Secondary | ICD-10-CM | POA: Diagnosis present

## 2013-06-03 DIAGNOSIS — I10 Essential (primary) hypertension: Secondary | ICD-10-CM | POA: Diagnosis present

## 2013-06-03 DIAGNOSIS — N400 Enlarged prostate without lower urinary tract symptoms: Secondary | ICD-10-CM | POA: Diagnosis present

## 2013-06-03 DIAGNOSIS — Z7982 Long term (current) use of aspirin: Secondary | ICD-10-CM

## 2013-06-03 DIAGNOSIS — E860 Dehydration: Secondary | ICD-10-CM | POA: Diagnosis present

## 2013-06-03 DIAGNOSIS — J209 Acute bronchitis, unspecified: Secondary | ICD-10-CM | POA: Diagnosis present

## 2013-06-03 DIAGNOSIS — E559 Vitamin D deficiency, unspecified: Secondary | ICD-10-CM | POA: Diagnosis present

## 2013-06-03 DIAGNOSIS — S0181XA Laceration without foreign body of other part of head, initial encounter: Secondary | ICD-10-CM

## 2013-06-03 DIAGNOSIS — M1990 Unspecified osteoarthritis, unspecified site: Secondary | ICD-10-CM | POA: Diagnosis present

## 2013-06-03 DIAGNOSIS — R5381 Other malaise: Secondary | ICD-10-CM

## 2013-06-03 DIAGNOSIS — R531 Weakness: Secondary | ICD-10-CM

## 2013-06-03 DIAGNOSIS — I959 Hypotension, unspecified: Secondary | ICD-10-CM | POA: Diagnosis not present

## 2013-06-03 DIAGNOSIS — F329 Major depressive disorder, single episode, unspecified: Secondary | ICD-10-CM | POA: Diagnosis present

## 2013-06-03 DIAGNOSIS — D72829 Elevated white blood cell count, unspecified: Secondary | ICD-10-CM | POA: Diagnosis present

## 2013-06-03 DIAGNOSIS — F411 Generalized anxiety disorder: Secondary | ICD-10-CM | POA: Diagnosis present

## 2013-06-03 DIAGNOSIS — R296 Repeated falls: Secondary | ICD-10-CM | POA: Diagnosis present

## 2013-06-03 DIAGNOSIS — S0180XA Unspecified open wound of other part of head, initial encounter: Secondary | ICD-10-CM

## 2013-06-03 DIAGNOSIS — I639 Cerebral infarction, unspecified: Secondary | ICD-10-CM | POA: Diagnosis present

## 2013-06-03 DIAGNOSIS — Z79899 Other long term (current) drug therapy: Secondary | ICD-10-CM

## 2013-06-03 DIAGNOSIS — G4730 Sleep apnea, unspecified: Secondary | ICD-10-CM | POA: Diagnosis present

## 2013-06-03 DIAGNOSIS — Z87891 Personal history of nicotine dependence: Secondary | ICD-10-CM

## 2013-06-03 DIAGNOSIS — Z7902 Long term (current) use of antithrombotics/antiplatelets: Secondary | ICD-10-CM

## 2013-06-03 DIAGNOSIS — R55 Syncope and collapse: Secondary | ICD-10-CM

## 2013-06-03 LAB — COMPREHENSIVE METABOLIC PANEL
ALT: 10 U/L (ref 0–53)
Calcium: 8.4 mg/dL (ref 8.4–10.5)
Creatinine, Ser: 0.88 mg/dL (ref 0.50–1.35)
GFR calc Af Amer: >90 mL/min (ref >90)
Glucose, Bld: 149 mg/dL — ABNORMAL HIGH (ref 70–99)
Sodium: 133 mEq/L — ABNORMAL LOW (ref 135–145)
Total Protein: 5.5 g/dL — ABNORMAL LOW (ref 6.0–8.3)

## 2013-06-03 LAB — TSH: TSH: 0.312 u[IU]/mL — ABNORMAL LOW (ref 0.350–4.500)

## 2013-06-03 LAB — TROPONIN I
Troponin I: <0.3 ng/mL (ref <0.30)
Troponin I: <0.3 ng/mL (ref <0.30)

## 2013-06-03 LAB — URINALYSIS, ROUTINE W REFLEX MICROSCOPIC
Bilirubin Urine: NEGATIVE
Leukocytes, UA: NEGATIVE
Nitrite: NEGATIVE
Specific Gravity, Urine: 1.024 (ref 1.005–1.030)
Urobilinogen, UA: 0.2 mg/dL (ref 0.0–1.0)
pH: 6 (ref 5.0–8.0)

## 2013-06-03 LAB — POCT I-STAT TROPONIN I: Troponin i, poc: 0 ng/mL (ref 0.00–0.08)

## 2013-06-03 LAB — CBC WITH DIFFERENTIAL/PLATELET
Basophils Absolute: 0 10*3/uL (ref 0.0–0.1)
Eosinophils Absolute: 0 10*3/uL (ref 0.0–0.7)
Eosinophils Relative: 0 % (ref 0–5)
Lymphs Abs: 1.4 10*3/uL (ref 0.7–4.0)
MCH: 32.7 pg (ref 26.0–34.0)
MCV: 92.3 fL (ref 78.0–100.0)
Monocytes Absolute: 1.4 10*3/uL — ABNORMAL HIGH (ref 0.1–1.0)
Platelets: 210 10*3/uL (ref 150–400)
RDW: 14.6 % (ref 11.5–15.5)

## 2013-06-03 MED ORDER — ACETAMINOPHEN 325 MG PO TABS: 650.0000 mg | ORAL_TABLET | Freq: Four times a day (QID) | ORAL | Status: DC | PRN

## 2013-06-03 MED ORDER — GUAIFENESIN ER 600 MG PO TB12
1200.0000 mg | ORAL_TABLET | Freq: Two times a day (BID) | ORAL | Status: DC
  Administered 2013-06-03 – 2013-06-04 (×3): 1200 mg via ORAL
  Filled 2013-06-03 (×5): qty 2

## 2013-06-03 MED ORDER — IPRATROPIUM BROMIDE 0.02 % IN SOLN
0.5000 mg | Freq: Four times a day (QID) | RESPIRATORY_TRACT | Status: DC
  Administered 2013-06-03 – 2013-06-04 (×3): 0.5 mg via RESPIRATORY_TRACT
  Filled 2013-06-03 (×4): qty 2.5

## 2013-06-03 MED ORDER — HEPARIN SODIUM (PORCINE) 5000 UNIT/ML IJ SOLN
5000.0000 [IU] | Freq: Three times a day (TID) | INTRAMUSCULAR | Status: DC
  Administered 2013-06-03 – 2013-06-05 (×5): 5000 [IU] via SUBCUTANEOUS
  Filled 2013-06-03 (×8): qty 1

## 2013-06-03 MED ORDER — GUAIFENESIN-DM 100-10 MG/5ML PO SYRP: 5.0000 mL | ORAL_SOLUTION | ORAL | Status: DC | PRN

## 2013-06-03 MED ORDER — ALUM & MAG HYDROXIDE-SIMETH 200-200-20 MG/5ML PO SUSP: 30.0000 mL | Freq: Four times a day (QID) | ORAL | Status: DC | PRN

## 2013-06-03 MED ORDER — MORPHINE SULFATE 2 MG/ML IJ SOLN: 1.0000 mg | INTRAMUSCULAR | Status: DC | PRN

## 2013-06-03 MED ORDER — ALBUTEROL SULFATE (5 MG/ML) 0.5% IN NEBU
2.5000 mg | INHALATION_SOLUTION | Freq: Four times a day (QID) | RESPIRATORY_TRACT | Status: DC
  Administered 2013-06-03 – 2013-06-04 (×3): 2.5 mg via RESPIRATORY_TRACT
  Filled 2013-06-03 (×4): qty 0.5

## 2013-06-03 MED ORDER — ONDANSETRON HCL 4 MG PO TABS: 4.0000 mg | ORAL_TABLET | Freq: Four times a day (QID) | ORAL | Status: DC | PRN

## 2013-06-03 MED ORDER — HYDROCODONE-ACETAMINOPHEN 5-325 MG PO TABS: 1.0000 | ORAL_TABLET | ORAL | Status: DC | PRN

## 2013-06-03 MED ORDER — ALBUTEROL SULFATE (5 MG/ML) 0.5% IN NEBU: 2.5000 mg | INHALATION_SOLUTION | RESPIRATORY_TRACT | Status: DC | PRN

## 2013-06-03 MED ORDER — IOHEXOL 350 MG/ML SOLN
80.0000 mL | Freq: Once | INTRAVENOUS | Status: AC | PRN
  Administered 2013-06-03: 80 mL via INTRAVENOUS

## 2013-06-03 MED ORDER — TETANUS-DIPHTH-ACELL PERTUSSIS 5-2.5-18.5 LF-MCG/0.5 IM SUSP
0.5000 mL | Freq: Once | INTRAMUSCULAR | Status: AC
  Administered 2013-06-03: 0.5 mL via INTRAMUSCULAR
  Filled 2013-06-03: qty 0.5

## 2013-06-03 MED ORDER — SODIUM CHLORIDE 0.9 % IV SOLN
INTRAVENOUS | Status: DC
  Administered 2013-06-03: 10:00:00 via INTRAVENOUS

## 2013-06-03 MED ORDER — LEVOFLOXACIN IN D5W 500 MG/100ML IV SOLN
500.0000 mg | INTRAVENOUS | Status: DC
  Administered 2013-06-03 – 2013-06-04 (×2): 500 mg via INTRAVENOUS
  Filled 2013-06-03 (×3): qty 100

## 2013-06-03 MED ORDER — ACETAMINOPHEN 650 MG RE SUPP: 650.0000 mg | Freq: Four times a day (QID) | RECTAL | Status: DC | PRN

## 2013-06-03 MED ORDER — ONDANSETRON HCL 4 MG/2ML IJ SOLN: 4.0000 mg | Freq: Four times a day (QID) | INTRAMUSCULAR | Status: DC | PRN

## 2013-06-03 MED ORDER — DOXEPIN HCL 75 MG PO CAPS
75.0000 mg | ORAL_CAPSULE | Freq: Every day | ORAL | Status: DC
  Administered 2013-06-03 – 2013-06-04 (×2): 75 mg via ORAL
  Filled 2013-06-03 (×4): qty 1

## 2013-06-03 MED ORDER — SODIUM CHLORIDE 0.9 % IV SOLN
INTRAVENOUS | Status: DC
  Administered 2013-06-03 – 2013-06-04 (×2): via INTRAVENOUS

## 2013-06-03 MED ORDER — SODIUM CHLORIDE 0.9 % IJ SOLN
3.0000 mL | Freq: Two times a day (BID) | INTRAMUSCULAR | Status: DC
  Administered 2013-06-03 – 2013-06-04 (×2): 3 mL via INTRAVENOUS

## 2013-06-03 NOTE — ED Notes (Signed)
Pt got up this am and stated that he became dizzy and fell. Pt hit head on floor. Has lac above left eyebrow. No bleeding noted at present time.  Pt saw MD yesterday and was dx with Bronchitis

## 2013-06-03 NOTE — ED Notes (Signed)
Pt to CT scanner via stretcher.

## 2013-06-03 NOTE — ED Notes (Signed)
When attemptedto stand to void, pt became very dizzy and had to be assisted back to bed.  Pt states "it wore him out to move"

## 2013-06-03 NOTE — ED Notes (Signed)
Pt remains in CT or xray

## 2013-06-03 NOTE — ED Notes (Signed)
MD at bedside at bedside.

## 2013-06-03 NOTE — ED Notes (Signed)
Pt denies neck or back pain. States his head hit the hard wood floor

## 2013-06-03 NOTE — ED Notes (Signed)
Pt to CT via stretcher, wife at bedside

## 2013-06-03 NOTE — ED Notes (Signed)
Alerted front desk to send wife back to pt when she arrives

## 2013-06-03 NOTE — ED Notes (Signed)
02 applied at 2lpm via [place] sats have been steady at 91% on room air

## 2013-06-03 NOTE — ED Provider Notes (Signed)
LACERATION REPAIR Performed by: Jeannetta Ellis Authorized by: Jeannetta Ellis Consent: Verbal consent obtained. Risks and benefits: risks, benefits and alternatives were discussed Consent given by: patient Patient identity confirmed: provided demographic data Prepped and Draped in normal sterile fashion Wound explored  Laceration Location: right eyebrow  Laceration Length: 3 cm  No Foreign Bodies seen or palpated  Anesthesia: local infiltration  Local anesthetic: lidocaine 2% w/o epinephrine  Anesthetic total: 5 ml  Irrigation method: syringe Amount of cleaning: standard  Skin closure: 5-0 Ethilon   Number of sutures: 6  Technique: simple interrupted  Patient tolerance: Patient tolerated the procedure well with no immediate complications.   TDAP given.   Jeannetta Ellis, PA-C 06/03/13 1456

## 2013-06-03 NOTE — ED Notes (Signed)
Assisted pt with Urinal. Wife remains at bedside. Awaiting hospitalist eval

## 2013-06-03 NOTE — ED Notes (Signed)
Pt wifes state she gave pt meds to EMS

## 2013-06-03 NOTE — H&P (Signed)
[facility] History and Physical  Meredith Mells ZOX:[id number] DOB: September 06, 1932 DOA: 06/03/2013  Referring physician: Toy Baker, MD PCP: Ezequiel Kayser, MD  Specialists:   Chief Complaint: Syncope and collapse  HPI: Lance [name] is a 79 y.o. male with past medical history of hypertension, BPH and sleep apnea. Patient presents to the hospital after syncope and collapse resulted in facial laceration. Patient mentioned for the past 3 weeks he was feeling weak and sometimes dizzy, has also other nonspecific generalized symptoms including easy fatigability and weakness. For the past several days he was having some cough and sputum production so he was presented to his primary care physician yesterday who started him on antibiotics for bronchitis. Today patient was trying to go to the bathroom and when he stood up he felt dizzy and he fell. He had his face on the hardwood floor and had laceration above his right eyebrow. He denies any chest pain, shortness of breath or palpitations. Denies any vertigo type of symptoms. In the ED CT scan of the head/face showed no internal bleeding or fractures, CT angio of the chest showed no evidence of abnormalities.. Has prerenal azotemia consistent with dehydration, WBC count of 15.6.  Review of Systems:  Constitutional: Generalized weakness Eyes: negative for irritation, redness and visual disturbance Ears, nose, mouth, throat, and face: negative for earaches, epistaxis, nasal congestion and sore throat Respiratory: negative for cough, dyspnea on exertion, sputum and wheezing Cardiovascular: Syncope, denies chest pain, dyspnea and orthopnea. Gastrointestinal: negative for abdominal pain, constipation, diarrhea, melena, nausea and vomiting Genitourinary:negative for dysuria, frequency and hematuria Hematologic/lymphatic: negative for bleeding, easy bruising and lymphadenopathy Musculoskeletal:negative for arthralgias, muscle weakness and  stiff joints Neurological: negative for coordination problems, gait problems, headaches and weakness Endocrine: negative for diabetic symptoms including polydipsia, polyuria and weight loss Allergic/Immunologic: negative for anaphylaxis, hay fever and urticaria  Past Medical History  Diagnosis Date  . Osteoarthritis   . Hypogonadism male   . BPH (benign prostatic hypertrophy) with urinary obstruction   . Hypertension   . Osteopenia   . Vitamin D deficiency   . Anxiety   . Depression   . Diverticulosis   . Colon polyps   . Hypercholesterolemia   . Glaucoma   . Sleep apnea   . Alcohol abuse     Remote hx, stopped in 1970  . History of pneumonia    Past Surgical History  Procedure Laterality Date  . Tonsillectomy    . Appendectomy  1945  . Shoulder surgery      rt   Social History:  reports that he quit smoking about 50 years ago. He has never used smokeless tobacco. He reports that he does not drink alcohol or use illicit drugs.  Allergies  Allergen Reactions  . Penicillins Anaphylaxis    No family history on file.   Prior to Admission medications   Medication Sig Start Date End Date Taking? Authorizing Provider  aspirin 325 MG tablet Take 325 mg by mouth at bedtime.   Yes Historical Provider, MD  Brimonidine Tartrate-Timolol (COMBIGAN OP) Apply 1 drop to eye 2 (two) times daily.   Yes Historical Provider, MD  buPROPion (WELLBUTRIN SR) 150 MG 12 hr tablet Take 150 mg by mouth 2 (two) times daily.   Yes Historical Provider, MD  CALCIUM CARBONATE PO Take 1 tablet by mouth daily.   Yes Historical Provider, MD  diazepam (VALIUM) 10 MG tablet Take 10 mg by mouth every 8 (eight) hours as needed for anxiety.  Yes Historical Provider, MD  doxepin (SINEQUAN) 75 MG capsule Take 75 mg by mouth at bedtime.   Yes Historical Provider, MD  ergocalciferol (VITAMIN D2) 50000 UNITS capsule Take 50,000 Units by mouth once a week. On monday   Yes Historical Provider, MD  finasteride  (PROSCAR) 5 MG tablet Take 10 mg by mouth every morning.   Yes Historical Provider, MD  Multiple Vitamin (MULTIVITAMIN) capsule Take 1 capsule by mouth once a week.   Yes Historical Provider, MD  niacin-lovastatin (ADVICOR) 1000-20 MG 24 hr tablet Take 1 tablet by mouth at bedtime.   Yes Historical Provider, MD  OMEGA 3 1000 MG CAPS Take 1 capsule by mouth 3 (three) times daily.   Yes Historical Provider, MD  Tamsulosin HCl (FLOMAX) 0.4 MG CAPS Take 0.8 mg by mouth every morning.   Yes Historical Provider, MD  TESTOSTERONE IM Inject into the muscle every 21 ( twenty-one) days.   Yes Historical Provider, MD  Travoprost (TRAVATAN OP) Apply 1 drop to eye at bedtime.   Yes Historical Provider, MD  triamterene-hydrochlorothiazide (DYAZIDE) 37.5-25 MG per capsule Take 0.5 capsules by mouth every morning.    Yes Historical Provider, MD  vitamin C (ASCORBIC ACID) 500 MG tablet Take 500 mg by mouth daily.   Yes Historical Provider, MD   Physical Exam: Filed Vitals:   06/03/13 1300  BP: 102/90  Pulse: 104  Temp:   Resp:    General appearance: alert, cooperative and no distress  Head: Laceration measures about 2.5 cm, and it is about 1 cm above the right eyebrow. Eyes: conjunctivae/corneas clear. PERRL, EOM's intact. Fundi benign.  Nose: Nares normal. Septum midline. Mucosa normal. No drainage or sinus tenderness.  Throat: lips, mucosa, and tongue normal; teeth and gums normal  Neck: Supple, no masses, no cervical lymphadenopathy, no JVD appreciated, no meningeal signs Resp: clear to auscultation bilaterally  Chest wall: no tenderness  Cardio: regular rate and rhythm, S1, S2 normal, no murmur, click, rub or gallop  GI: soft, non-tender; bowel sounds normal; no masses, no organomegaly  Extremities: extremities normal, atraumatic, no cyanosis or edema  Skin: Skin color, texture, turgor normal. No rashes or lesions  Neurologic: Alert and oriented X 3, normal strength and tone. Normal symmetric  reflexes. Normal coordination and gait  Labs on Admission:  Basic Metabolic Panel:  Recent Labs Lab 06/03/13 0945  NA 133*  K 4.2  CL 100  CO2 21  GLUCOSE 149*  BUN 62*  CREATININE 0.88  CALCIUM 8.4   Liver Function Tests:  Recent Labs Lab 06/03/13 0945  AST 11  ALT 10  ALKPHOS 33*  BILITOT 0.3  PROT 5.5*  ALBUMIN 2.9*   No results found for this basename: LIPASE, AMYLASE,  in the last 168 hours No results found for this basename: AMMONIA,  in the last 168 hours CBC:  Recent Labs Lab 06/03/13 0945  WBC 15.6*  NEUTROABS 12.8*  HGB 12.3*  HCT 34.7*  MCV 92.3  PLT 210   Cardiac Enzymes: No results found for this basename: CKTOTAL, CKMB, CKMBINDEX, TROPONINI,  in the last 168 hours  BNP (last 3 results) No results found for this basename: PROBNP,  in the last 8760 hours CBG: No results found for this basename: GLUCAP,  in the last 168 hours  Radiological Exams on Admission: Dg Chest 2 View  06/03/2013   *RADIOLOGY REPORT*  Clinical Data: Pain  CHEST - 2 VIEW  Comparison: Prior chest x-ray 04/02/2011  Findings: Stable chronic left  basilar atelectasis versus scarring. Cardiac and mediastinal contours within normal limits.  There is atherosclerotic calcification of the transverse aorta.  The lungs are clear.  No acute osseous abnormality.  Mild stable central bronchitic changes.  IMPRESSION: No acute cardiopulmonary disease.   Original Report Authenticated By: Malachy Moan, M.D.   Ct Head Wo Contrast  06/03/2013   *RADIOLOGY REPORT*  Clinical Data:  Fall, scalp laceration  CT HEAD WITHOUT CONTRAST CT MAXILLOFACIAL WITHOUT CONTRAST  Technique:  Multidetector CT imaging of the head and maxillofacial structures were performed using the standard protocol without intravenous contrast. Multiplanar CT image reconstructions of the maxillofacial structures were also generated.  Comparison:  Most recent prior CT scan of the head 04/02/2011  CT HEAD  Findings: No acute  intracranial hemorrhage, acute infarction, mass lesion, mass effect, midline shift or hydrocephalus.  Gray-white differentiation is preserved throughout.  Stable atrophy and mild chronic microvascular ischemic white matter changes. Atherosclerotic calcifications in the bilateral cavernous carotid arteries.  Stable lacunar infarct versus dilated perivascular space in the left sub insular cortex.  No focal scalp hematoma or soft tissue swelling.  The globes are intact and orbits are symmetric and unremarkable.  Normal aeration of the mastoid air cells and paranasal sinuses.  No acute calvarial abnormality or fracture.  IMPRESSION:  1.  No acute intracranial abnormality. 2. Stable chronic changes.  CT MAXILLOFACIAL  Findings:   No focal scalp hematoma or soft tissue swelling.  No focal soft tissue abnormality.  No facial fracture.  Globes are intact.  Orbits symmetric and unremarkable.  Normal aeration of the paranasal sinuses and mastoid air cells.  Atherosclerotic calcifications in the internal carotid arteries.  IMPRESSION:  No acute fracture or malalignment.   Original Report Authenticated By: Malachy Moan, M.D.   Ct Angio Chest Pe W/cm &/or Wo Cm  06/03/2013   *RADIOLOGY REPORT*  Clinical Data: Chest pain.  CT ANGIOGRAPHY CHEST  Technique:  Multidetector CT imaging of the chest using the standard protocol during bolus administration of intravenous contrast. Multiplanar reconstructed images including MIPs were obtained and reviewed to evaluate the vascular anatomy.  Contrast: 80mL OMNIPAQUE IOHEXOL 350 MG/ML SOLN  Comparison: Chest x-ray earlier today  Findings: Lungs are adequately inflated with minimal heterogeneous density and mild nodularity over the lingula.  This nodularity measures 1.1 cm and likely represents atelectasis or scarring. There is minimal scarring versus atelectasis over the lateral left lower lobe.  Heart is within normal in size.  There is no evidence of pulmonary embolism.  There is  minimal calcification of the left anterior descending coronary artery.  Remaining mediastinal structures are unremarkable.  Images through the upper abdomen are unremarkable.  There is degenerate change of the spine.  IMPRESSION: No acute cardiopulmonary disease and no evidence of pulmonary embolism.  Minimal left basilar and lingular scarring/atelectasis.  Note that there is a mild nodular component measuring 1.1 cm to the probable atelectatic change in the lingula as would recommend a follow-up CT in 3 months.   Original Report Authenticated By: Elberta Fortis, M.D.   Ct Maxillofacial Wo Cm  06/03/2013   *RADIOLOGY REPORT*  Clinical Data:  Fall, scalp laceration  CT HEAD WITHOUT CONTRAST CT MAXILLOFACIAL WITHOUT CONTRAST  Technique:  Multidetector CT imaging of the head and maxillofacial structures were performed using the standard protocol without intravenous contrast. Multiplanar CT image reconstructions of the maxillofacial structures were also generated.  Comparison:  Most recent prior CT scan of the head 04/02/2011  CT HEAD  Findings: No  acute intracranial hemorrhage, acute infarction, mass lesion, mass effect, midline shift or hydrocephalus.  Gray-white differentiation is preserved throughout.  Stable atrophy and mild chronic microvascular ischemic white matter changes. Atherosclerotic calcifications in the bilateral cavernous carotid arteries.  Stable lacunar infarct versus dilated perivascular space in the left sub insular cortex.  No focal scalp hematoma or soft tissue swelling.  The globes are intact and orbits are symmetric and unremarkable.  Normal aeration of the mastoid air cells and paranasal sinuses.  No acute calvarial abnormality or fracture.  IMPRESSION:  1.  No acute intracranial abnormality. 2. Stable chronic changes.  CT MAXILLOFACIAL  Findings:   No focal scalp hematoma or soft tissue swelling.  No focal soft tissue abnormality.  No facial fracture.  Globes are intact.  Orbits symmetric and  unremarkable.  Normal aeration of the paranasal sinuses and mastoid air cells.  Atherosclerotic calcifications in the internal carotid arteries.  IMPRESSION:  No acute fracture or malalignment.   Original Report Authenticated By: Malachy Moan, M.D.    EKG: Independently reviewed.   Assessment/Plan Principal Problem:   Syncope and collapse Active Problems:   Generalized weakness   Facial laceration   Hypertension   Acute bronchitis    Syncope and collapse -Patient was complaining about now is weakness/dizziness for several weeks. -This is might be exacerbated by his recent symptoms of acute bronchitis. -Hold isn't hypertensive medications (Dyazide). -Has negative CT scan of the head, I will order MRI of the brain. -From the history seems to be orthostatic, check orthostatic blood pressure, hydrate with IV fluids.  Facial laceration -CT scan of the face didn't show other abnormalities. -The laceration repaired in the emergency department.  Acute bronchitis -Started on IV Levaquin. -Supportive management with bronchodilators, mucolytics, antitussives and oxygen as needed.  Generalized weakness -Could be low blood pressure, unclear etiology. -Treat bronchitis and assess with PT.  Leukocytosis -Likely secondary to bronchitis, he does not know if he was started on steroids (as outpatient) or not.  Code Status: Full code Family Communication: Plan discussed with the patient in the presence of his wife at bedside. Disposition Plan: Inpatient, telemetry  Time spent: 70 minutes  [facility] Pager [phone]  If 7PM-7AM, please contact night-coverage [email or web address] Password [email or web address] 06/03/2013, 2:53 PM

## 2013-06-03 NOTE — ED Notes (Signed)
Returned from CT via stretcher. Pt denies dizziness. Pt using urinal wife assisting at bedside

## 2013-06-03 NOTE — ED Provider Notes (Signed)
CSN: [id number]     Arrival date & time 06/03/13  1308 History   First MD Initiated Contact with Patient 06/03/13 [phone]     Chief Complaint  Patient presents with  . Fall  . Facial Laceration   (Consider location/radiation/quality/duration/timing/severity/associated sxs/prior Treatment) Patient is a 79 y.o. male presenting with fall. The history is provided by the patient and the spouse.  Fall   patient here after sustaining a fall and injuring his right side of face. Was at home and he stood up and became dizzy and fell face forward. No loss of consciousness. Denies any head or neck pain. Did note a laceration about the right eyebrow with bleeding that was controlled by direct pressure. Notes mild lower back pain without radicular symptoms. Denies any chest pain or shortness of breath prior to the event. Patient has had some cough. Denied any abdominal pain. Saw his Dr. Burgess Estelle and was treated for bronchitis. Has been having trouble with dizziness for the past week that is worse with standing. Denies any recent changes to his antihypertensives. Called EMS and was transported here  Past Medical History  Diagnosis Date  . Osteoarthritis   . Hypogonadism male   . BPH (benign prostatic hypertrophy) with urinary obstruction   . Hypertension   . Osteopenia   . Vitamin d deficiency   . Anxiety   . Depression   . Diverticulosis   . Colon polyps   . Hypercholesterolemia   . Glaucoma   . Sleep apnea   . Alcohol abuse     stopped in 1970  . History of pneumonia    Past Surgical History  Procedure Laterality Date  . Tonsillectomy    . Appendectomy  1945  . Shoulder surgery      rt   No family history on file. History  Substance Use Topics  . Smoking status: Former Smoker    Quit date: 09/28/1962  . Smokeless tobacco: Never Used  . Alcohol Use: No    Review of Systems  All other systems reviewed and are negative.    Allergies  Penicillins  Home Medications   Current  Outpatient Rx  Name  Route  Sig  Dispense  Refill  . aspirin 325 MG tablet   Oral   Take 325 mg by mouth at bedtime.         . Brimonidine Tartrate-Timolol (COMBIGAN OP)   Ophthalmic   Apply 1 drop to eye 2 (two) times daily.         Marland Kitchen buPROPion (WELLBUTRIN SR) 150 MG 12 hr tablet   Oral   Take 150 mg by mouth 2 (two) times daily.         Marland Kitchen CALCIUM CARBONATE PO   Oral   Take 1 tablet by mouth daily.         . diazepam (VALIUM) 10 MG tablet   Oral   Take 10 mg by mouth as needed.         . doxepin (SINEQUAN) 75 MG capsule   Oral   Take 75 mg by mouth at bedtime.         . ergocalciferol (VITAMIN D2) 50000 UNITS capsule   Oral   Take 50,000 Units by mouth once a week.         . finasteride (PROSCAR) 5 MG tablet   Oral   Take 10 mg by mouth every morning.         . Multiple Vitamin (MULTIVITAMIN) capsule   Oral  Take 1 capsule by mouth once a week.         . niacin-lovastatin (ADVICOR) 1000-20 MG 24 hr tablet   Oral   Take 1 tablet by mouth at bedtime.         . OMEGA 3 1000 MG CAPS   Oral   Take 1 capsule by mouth 3 (three) times daily.         . Tamsulosin HCl (FLOMAX) 0.4 MG CAPS   Oral   Take 0.8 mg by mouth every morning.         . TESTOSTERONE IM   Intramuscular   Inject into the muscle every 21 ( twenty-one) days.         . Travoprost (TRAVATAN OP)   Ophthalmic   Apply 1 drop to eye at bedtime.         . triamterene-hydrochlorothiazide (DYAZIDE) 37.5-25 MG per capsule   Oral   Take 1 capsule by mouth every morning.         . vitamin C (ASCORBIC ACID) 500 MG tablet   Oral   Take 500 mg by mouth daily.          BP 108/59  Pulse 103  Temp(Src) 98.6 F (37 C) (Oral)  Resp 14  SpO2 98% Physical Exam  Nursing note and vitals reviewed. Constitutional: He is oriented to person, place, and time. He appears well-developed and well-nourished.  Non-toxic appearance. No distress.  HENT:  Head: Normocephalic and  atraumatic.    Eyes: Conjunctivae, EOM and lids are normal. Pupils are equal, round, and reactive to light.  Neck: Normal range of motion. Neck supple. No spinous process tenderness and no muscular tenderness present. No tracheal deviation present. No mass present.  Cardiovascular: Normal rate, regular rhythm and normal heart sounds.  Exam reveals no gallop.   No murmur heard. Pulmonary/Chest: Effort normal and breath sounds normal. No stridor. No respiratory distress. He has no decreased breath sounds. He has no wheezes. He has no rhonchi. He has no rales.  Abdominal: Soft. Normal appearance and bowel sounds are normal. He exhibits no distension. There is no tenderness. There is no rebound and no CVA tenderness.  Musculoskeletal: Normal range of motion. He exhibits no edema and no tenderness.  Neurological: He is alert and oriented to person, place, and time. He has normal strength. No cranial nerve deficit or sensory deficit. GCS eye subscore is 4. GCS verbal subscore is 5. GCS motor subscore is 6.  Skin: Skin is warm and dry. No abrasion and no rash noted.  Psychiatric: He has a normal mood and affect. His speech is normal and behavior is normal.    ED Course  Procedures (including critical care time) Labs Review Labs Reviewed - No data to display Imaging Review No results found.  MDM  No diagnosis found.  Date: 06/03/2013  Rate: 92  Rhythm: normal sinus rhythm  QRS Axis: normal  Intervals: normal  ST/T Wave abnormalities: normal  Conduction Disutrbances:none  Narrative Interpretation:   Old EKG Reviewed: none available  Patient's laceration repaired by the physician assistant. Patient had a CT of his chest for evaluation of pulmonary embolism versus pulmonary infection which did not show either. Patient became dizzy and lightheaded when standing. He also had an episode of tachycardia when he first arrived but that has since resolved. Bleeders used this time. Since then treated.  He does remain hypoxic. He has a mild leukocytosis. He will require admission for further evaluation. Spoke with his  primary care Dr., Dr. Sherrye Payor, who saw the patient in the ER and agrees with admission. [facility] will see the pt      Toy Baker, MD 06/03/13 1435

## 2013-06-03 NOTE — ED Notes (Signed)
Wife given coffee while waiting

## 2013-06-03 NOTE — ED Notes (Signed)
Pt needing to use urinal, explained to pt that he had to stay in bed to use the urinal. Pt wanting to sit on the side of bed with no tech or nurse in the room. Explained to pt importance of staying in bed to use the bathroom. Pt agreeable.

## 2013-06-03 NOTE — ED Notes (Signed)
Pt remains in ct 

## 2013-06-04 DIAGNOSIS — I519 Heart disease, unspecified: Secondary | ICD-10-CM

## 2013-06-04 DIAGNOSIS — I10 Essential (primary) hypertension: Secondary | ICD-10-CM

## 2013-06-04 DIAGNOSIS — I6350 Cerebral infarction due to unspecified occlusion or stenosis of unspecified cerebral artery: Principal | ICD-10-CM

## 2013-06-04 LAB — CBC
HCT: 28.9 % — ABNORMAL LOW (ref 39.0–52.0)
MCHC: 34.6 g/dL (ref 30.0–36.0)
RDW: 15.5 % (ref 11.5–15.5)
WBC: 15.2 10*3/uL — ABNORMAL HIGH (ref 4.0–10.5)

## 2013-06-04 LAB — BASIC METABOLIC PANEL
BUN: 34 mg/dL — ABNORMAL HIGH (ref 6–23)
Chloride: 104 mEq/L (ref 96–112)
Creatinine, Ser: 0.84 mg/dL (ref 0.50–1.35)
GFR calc Af Amer: >90 mL/min (ref >90)
GFR calc non Af Amer: 81 mL/min — ABNORMAL LOW (ref >90)
Potassium: 3.5 mEq/L (ref 3.5–5.1)

## 2013-06-04 LAB — TROPONIN I: Troponin I: <0.3 ng/mL (ref <0.30)

## 2013-06-04 LAB — LIPID PANEL: Cholesterol: 101 mg/dL (ref 0–200)

## 2013-06-04 MED ORDER — IPRATROPIUM BROMIDE 0.02 % IN SOLN: 0.5000 mg | RESPIRATORY_TRACT | Status: DC | PRN

## 2013-06-04 MED ORDER — DOXEPIN HCL 75 MG PO CAPS: 75.0000 mg | ORAL_CAPSULE | Freq: Every day | ORAL | Status: DC

## 2013-06-04 MED ORDER — SIMVASTATIN 10 MG PO TABS
10.0000 mg | ORAL_TABLET | Freq: Every day | ORAL | Status: DC
  Administered 2013-06-04: 22:00:00 10 mg via ORAL
  Filled 2013-06-04 (×2): qty 1

## 2013-06-04 MED ORDER — NIACIN ER 500 MG PO CPCR
1000.0000 mg | ORAL_CAPSULE | Freq: Every day | ORAL | Status: DC
  Administered 2013-06-04: 1000 mg via ORAL
  Filled 2013-06-04 (×2): qty 2

## 2013-06-04 MED ORDER — DIAZEPAM 5 MG PO TABS
10.0000 mg | ORAL_TABLET | Freq: Three times a day (TID) | ORAL | Status: DC | PRN
Start: 2013-06-04 — End: 2013-06-05

## 2013-06-04 MED ORDER — SODIUM CHLORIDE 0.9 % IV BOLUS (SEPSIS)
500.0000 mL | Freq: Once | INTRAVENOUS | Status: AC
  Administered 2013-06-04: 12:00:00 via INTRAVENOUS

## 2013-06-04 MED ORDER — ALBUTEROL SULFATE (5 MG/ML) 0.5% IN NEBU: 2.5000 mg | INHALATION_SOLUTION | RESPIRATORY_TRACT | Status: DC | PRN

## 2013-06-04 MED ORDER — BUPROPION HCL ER (SR) 150 MG PO TB12
150.0000 mg | ORAL_TABLET | Freq: Two times a day (BID) | ORAL | Status: DC
  Administered 2013-06-04 (×2): 150 mg via ORAL
  Filled 2013-06-04 (×4): qty 1

## 2013-06-04 MED ORDER — TAMSULOSIN HCL 0.4 MG PO CAPS
0.8000 mg | ORAL_CAPSULE | Freq: Every day | ORAL | Status: DC
  Administered 2013-06-04: 13:00:00 0.8 mg via ORAL
  Filled 2013-06-04 (×2): qty 2

## 2013-06-04 MED ORDER — CLOPIDOGREL BISULFATE 75 MG PO TABS
75.0000 mg | ORAL_TABLET | Freq: Every day | ORAL | Status: DC
  Administered 2013-06-05: 75 mg via ORAL
  Filled 2013-06-04 (×2): qty 1

## 2013-06-04 MED ORDER — FINASTERIDE 5 MG PO TABS
10.0000 mg | ORAL_TABLET | Freq: Every day | ORAL | Status: DC
  Administered 2013-06-04: 13:00:00 10 mg via ORAL
  Filled 2013-06-04 (×2): qty 2

## 2013-06-04 MED ORDER — NIACIN-LOVASTATIN ER 1000-20 MG PO TB24: 1.0000 | ORAL_TABLET | Freq: Every day | ORAL | Status: DC

## 2013-06-04 NOTE — Progress Notes (Signed)
Patient heart rate into 130-140's non-sustained when getting OOB, quickly returning to baseline of low 100's once back in bed. Patient remains asymptomatic. Will continue to monitor. Troy Sine

## 2013-06-04 NOTE — Significant Event (Signed)
Patient passed swallow study,  diet reinstated per MD order.

## 2013-06-04 NOTE — Progress Notes (Signed)
  Echocardiogram 2D Echocardiogram has been performed.  Lance [name] Lance [name] 06/04/2013, 12:22 PM 

## 2013-06-04 NOTE — ED Provider Notes (Signed)
Medical screening examination/treatment/procedure(s) were conducted as a shared visit with non-physician practitioner(s) and myself.  I personally evaluated the patient during the encounter  Toy Baker, MD 06/04/13 2041

## 2013-06-04 NOTE — Progress Notes (Signed)
*  PRELIMINARY RESULTS* Vascular Ultrasound Carotid Duplex (Doppler) has been completed. Findings suggest 1-39% internal carotid artery stenosis bilaterally. Vertebral arteries are patent with antegrade flow.  06/04/2013 11:57 AM Gertie Fey, RVT, RDCS, RDMS

## 2013-06-04 NOTE — Evaluation (Signed)
Clinical/Bedside Swallow Evaluation Patient Details  Name: Lance [name] MRN: [id number] Date of Birth: Mar 06, 1932  Today's Date: 06/04/2013 Time: 1330-1400 SLP Time Calculation (min): 30 min  Past Medical History:  Past Medical History  Diagnosis Date  . Osteoarthritis   . Hypogonadism male   . BPH (benign prostatic hypertrophy) with urinary obstruction   . Hypertension   . Osteopenia   . Vitamin D deficiency   . Anxiety   . Depression   . Diverticulosis   . Colon polyps   . Hypercholesterolemia   . Glaucoma   . Sleep apnea   . Alcohol abuse     Remote hx, stopped in 1970  . History of pneumonia    Past Surgical History:  Past Surgical History  Procedure Laterality Date  . Tonsillectomy    . Appendectomy  1945  . Shoulder surgery      rt   HPI:  Lance [name] is a 79 y.o. male with past medical history of hypertension, BPH and sleep apnea. Patient presents to the hospital after syncope and collapse resulted in facial laceration. Patient mentioned for the past 3 weeks he was feeling weak and sometimes dizzy, has also other nonspecific generalized symptoms including easy fatigability and weakness. For the past several days he was having some cough and sputum production so he was presented to his primary care physician yesterday who started him on antibiotics for bronchitis. Today patient was trying to go to the bathroom and when he stood up he felt dizzy and he fell. He had his face on the hardwood floor and had laceration above his right eyebrow. He denies any chest pain, shortness of breath or palpitations. Denies any vertigo type of symptoms.  BSE ordered per stroke protocol.    Assessment / Plan / Recommendation Clinical Impression  Oropharyngeal swallow funtional for regular consistency and thin liquids with no outward s/s of aspiration noted throughout evaluation.  Recommend to proceed with regular consistency and thin liquids.  ST to sign off as education  complete.      Aspiration Risk  Mild    Diet Recommendation Regular   Medication Administration: Whole meds with liquid Postural Changes and/or Swallow Maneuvers: Out of bed for meals;Seated upright 90 degrees;Upright 30-60 min after meal    Other  Recommendations Oral Care Recommendations: Oral care BID              Swallow Study Prior Functional Status   Lives at home with spouse     General Date of Onset: 06/03/13 HPI: Lance [name] is a 79 y.o. male with past medical history of hypertension, BPH and sleep apnea. Patient presents to the hospital after syncope and collapse resulted in facial laceration. Patient mentioned for the past 3 weeks he was feeling weak and sometimes dizzy, has also other nonspecific generalized symptoms including easy fatigability and weakness. For the past several days he was having some cough and sputum production so he was presented to his primary care physician yesterday who started him on antibiotics for bronchitis. Today patient was trying to go to the bathroom and when he stood up he felt dizzy and he fell. He had his face on the hardwood floor and had laceration above his right eyebrow. He denies any chest pain, shortness of breath or palpitations. Denies any vertigo type of symptoms. Type of Study: Bedside swallow evaluation Diet Prior to this Study: NPO Respiratory Status: Room air History of Recent Intubation: No Behavior/Cognition: Alert;Cooperative;Pleasant mood Oral Cavity - Dentition: Adequate natural  dentition Self-Feeding Abilities: Able to feed self Patient Positioning: Upright in chair Baseline Vocal Quality: Clear Volitional Cough: Strong Volitional Swallow: Able to elicit    Oral/Motor/Sensory Function Overall Oral Motor/Sensory Function: Appears within functional limits for tasks assessed   Ice Chips Ice chips: Within functional limits Presentation: Self Fed   Thin Liquid Thin Liquid: Within functional limits Presentation:  Cup;Spoon;Straw    Nectar Thick Nectar Thick Liquid: Not tested   Honey Thick Honey Thick Liquid: Not tested   Puree Puree: Within functional limits Presentation: Self Fed   Solid   GO    Solid: Within functional limits      Moreen Fowler MS, CCC-SLP [phone] [facility] 06/04/2013,4:41 PM

## 2013-06-04 NOTE — Progress Notes (Signed)
[facility] PROGRESS NOTE Assessment/Plan:  *Syncope and collapse due to CVA (cerebral infarction) - MRI showed small pontine stroke. - HgbA1c, fasting lipid panel  - PT consult, OT consult, Speech consult pending. - Echocardiogram, carotid dopplers  - Prophylactic therapy-Antiplatelet med:use plavix. - Avoid D5 fluids as may be harmfull - Cardiac Monitoring; no events - Neurochecks q4h    Facial laceration - CT scan of the face didn't show other abnormalities.  -The laceration repaired in the emergency department - pain control.  Acute bronchitis  -Started on IV Levaquin.  -Supportive management with bronchodilators, mucolytics, antitussives and oxygen as needed. - SLP.  Hypertension: - hold Bp medication. - Bp low give 500 cc bolus.    Code Status: full Family Communication: none  Disposition Plan: inpatient   Consultants:  none  Procedures:  MRi  carotid  Antibiotics:  None (indicate start date, and stop date if known)  HPI/Subjective: No complains  Objective: Filed Vitals:   06/03/13 1627 06/03/13 2145 06/04/13 0215 06/04/13 0609  BP:  152/66 117/58 114/53  Pulse:  96 107 99  Temp:  97.8 F (36.6 C) 98.4 F (36.9 C) 98.2 F (36.8 C)  TempSrc:  Oral Oral Oral  Resp:  18 20 20   Height:      Weight:    86.7 kg (191 lb 2.2 oz)  SpO2: 98% 100% 97% 93%    Intake/Output Summary (Last 24 hours) at 06/04/13 1032 Last data filed at 06/04/13 0843  Gross per 24 hour  Intake 1912.08 ml  Output    621 ml  Net 1291.08 ml   Filed Weights   06/03/13 1619 06/04/13 0609  Weight: 86.047 kg (189 lb 11.2 oz) 86.7 kg (191 lb 2.2 oz)    Exam:  General: Alert, awake, oriented x3, in no acute distress.  HEENT: No bruits, no goiter.  Heart: Regular rate and rhythm, without murmurs, rubs, gallops.  Neuro: Grossly intact, nonfocal.   Data Reviewed: Basic Metabolic Panel:  Recent Labs Lab 06/03/13 0945 06/04/13 0425  NA 133* 135  K 4.2 3.5   CL 100 104  CO2 21 23  GLUCOSE 149* 129*  BUN 62* 34*  CREATININE 0.88 0.84  CALCIUM 8.4 8.2*   Liver Function Tests:  Recent Labs Lab 06/03/13 0945  AST 11  ALT 10  ALKPHOS 33*  BILITOT 0.3  PROT 5.5*  ALBUMIN 2.9*   No results found for this basename: LIPASE, AMYLASE,  in the last 168 hours No results found for this basename: AMMONIA,  in the last 168 hours CBC:  Recent Labs Lab 06/03/13 0945 06/04/13 0425  WBC 15.6* 15.2*  NEUTROABS 12.8*  --   HGB 12.3* 10.0*  HCT 34.7* 28.9*  MCV 92.3 92.9  PLT 210 209   Cardiac Enzymes:  Recent Labs Lab 06/03/13 1625 06/03/13 2245 06/04/13 0425  TROPONINI <0.30 <0.30 <0.30   BNP (last 3 results) No results found for this basename: PROBNP,  in the last 8760 hours CBG: No results found for this basename: GLUCAP,  in the last 168 hours  No results found for this or any previous visit (from the past 240 hour(s)).   Studies: Dg Chest 2 View  06/03/2013   *RADIOLOGY REPORT*  Clinical Data: Pain  CHEST - 2 VIEW  Comparison: Prior chest x-ray 04/02/2011  Findings: Stable chronic left basilar atelectasis versus scarring. Cardiac and mediastinal contours within normal limits.  There is atherosclerotic calcification of the transverse aorta.  The lungs are clear.  No  acute osseous abnormality.  Mild stable central bronchitic changes.  IMPRESSION: No acute cardiopulmonary disease.   Original Report Authenticated By: Malachy Moan, M.D.   Ct Head Wo Contrast  06/03/2013   *RADIOLOGY REPORT*  Clinical Data:  Fall, scalp laceration  CT HEAD WITHOUT CONTRAST CT MAXILLOFACIAL WITHOUT CONTRAST  Technique:  Multidetector CT imaging of the head and maxillofacial structures were performed using the standard protocol without intravenous contrast. Multiplanar CT image reconstructions of the maxillofacial structures were also generated.  Comparison:  Most recent prior CT scan of the head 04/02/2011  CT HEAD  Findings: No acute intracranial  hemorrhage, acute infarction, mass lesion, mass effect, midline shift or hydrocephalus.  Gray-white differentiation is preserved throughout.  Stable atrophy and mild chronic microvascular ischemic white matter changes. Atherosclerotic calcifications in the bilateral cavernous carotid arteries.  Stable lacunar infarct versus dilated perivascular space in the left sub insular cortex.  No focal scalp hematoma or soft tissue swelling.  The globes are intact and orbits are symmetric and unremarkable.  Normal aeration of the mastoid air cells and paranasal sinuses.  No acute calvarial abnormality or fracture.  IMPRESSION:  1.  No acute intracranial abnormality. 2. Stable chronic changes.  CT MAXILLOFACIAL  Findings:   No focal scalp hematoma or soft tissue swelling.  No focal soft tissue abnormality.  No facial fracture.  Globes are intact.  Orbits symmetric and unremarkable.  Normal aeration of the paranasal sinuses and mastoid air cells.  Atherosclerotic calcifications in the internal carotid arteries.  IMPRESSION:  No acute fracture or malalignment.   Original Report Authenticated By: Malachy Moan, M.D.   Ct Angio Chest Pe W/cm &/or Wo Cm  06/03/2013   *RADIOLOGY REPORT*  Clinical Data: Chest pain.  CT ANGIOGRAPHY CHEST  Technique:  Multidetector CT imaging of the chest using the standard protocol during bolus administration of intravenous contrast. Multiplanar reconstructed images including MIPs were obtained and reviewed to evaluate the vascular anatomy.  Contrast: 80mL OMNIPAQUE IOHEXOL 350 MG/ML SOLN  Comparison: Chest x-ray earlier today  Findings: Lungs are adequately inflated with minimal heterogeneous density and mild nodularity over the lingula.  This nodularity measures 1.1 cm and likely represents atelectasis or scarring. There is minimal scarring versus atelectasis over the lateral left lower lobe.  Heart is within normal in size.  There is no evidence of pulmonary embolism.  There is minimal  calcification of the left anterior descending coronary artery.  Remaining mediastinal structures are unremarkable.  Images through the upper abdomen are unremarkable.  There is degenerate change of the spine.  IMPRESSION: No acute cardiopulmonary disease and no evidence of pulmonary embolism.  Minimal left basilar and lingular scarring/atelectasis.  Note that there is a mild nodular component measuring 1.1 cm to the probable atelectatic change in the lingula as would recommend a follow-up CT in 3 months.   Original Report Authenticated By: Elberta Fortis, M.D.   Mr Brain Wo Contrast  06/03/2013   *RADIOLOGY REPORT*  Clinical Data: Vertigo and subsequent fall.  MRI HEAD WITHOUT CONTRAST  Technique:  Multiplanar, multiecho pulse sequences of the brain and surrounding structures were obtained according to standard protocol without intravenous contrast.  Comparison: 06/03/2013 CT.  04/03/2011 MR.  Findings: Suggestion of tiny left paracentral pontine infarct.  No intracranial hemorrhage.  Global atrophy without hydrocephalus.  Mild small vessel disease type changes.  No intracranial mass lesion detected on this unenhanced exam.  Major intracranial vascular structures are patent with small right vertebral artery and ectatic left vertebral artery/basilar  artery.  IMPRESSION: Suggestion of tiny left paracentral pontine infarct.  No intracranial hemorrhage.  Global atrophy without hydrocephalus.  Mild small vessel disease type changes.   Original Report Authenticated By: Lacy Duverney, M.D.   Ct Maxillofacial Wo Cm  06/03/2013   *RADIOLOGY REPORT*  Clinical Data:  Fall, scalp laceration  CT HEAD WITHOUT CONTRAST CT MAXILLOFACIAL WITHOUT CONTRAST  Technique:  Multidetector CT imaging of the head and maxillofacial structures were performed using the standard protocol without intravenous contrast. Multiplanar CT image reconstructions of the maxillofacial structures were also generated.  Comparison:  Most recent prior CT scan  of the head 04/02/2011  CT HEAD  Findings: No acute intracranial hemorrhage, acute infarction, mass lesion, mass effect, midline shift or hydrocephalus.  Gray-white differentiation is preserved throughout.  Stable atrophy and mild chronic microvascular ischemic white matter changes. Atherosclerotic calcifications in the bilateral cavernous carotid arteries.  Stable lacunar infarct versus dilated perivascular space in the left sub insular cortex.  No focal scalp hematoma or soft tissue swelling.  The globes are intact and orbits are symmetric and unremarkable.  Normal aeration of the mastoid air cells and paranasal sinuses.  No acute calvarial abnormality or fracture.  IMPRESSION:  1.  No acute intracranial abnormality. 2. Stable chronic changes.  CT MAXILLOFACIAL  Findings:   No focal scalp hematoma or soft tissue swelling.  No focal soft tissue abnormality.  No facial fracture.  Globes are intact.  Orbits symmetric and unremarkable.  Normal aeration of the paranasal sinuses and mastoid air cells.  Atherosclerotic calcifications in the internal carotid arteries.  IMPRESSION:  No acute fracture or malalignment.   Original Report Authenticated By: Malachy Moan, M.D.    Scheduled Meds: . doxepin  75 mg Oral QHS  . guaiFENesin  1,200 mg Oral BID  . heparin  5,000 Units Subcutaneous Q8H  . levofloxacin (LEVAQUIN) IV  500 mg Intravenous Q24H  . sodium chloride  3 mL Intravenous Q12H   Continuous Infusions: . sodium chloride 125 mL/hr at 06/03/13 1750     FELIZ Rosine Beat  [facility] Pager [phone]. If 8PM-8AM, please contact night-coverage at [email or web address], password [facility] 06/04/2013, 10:32 AM  LOS: 1 day

## 2013-06-04 NOTE — Evaluation (Addendum)
Physical Therapy Evaluation Patient Details Name: Lance [name] MRN: [id number] DOB: 1932/03/17 Today's Date: 06/04/2013 Time: 7829-5621 PT Time Calculation (min): 36 min  PT Assessment / Plan / Recommendation History of Present Illness  : Lance [name] is a 79 y.o. male with past medical history of hypertension, BPH and sleep apnea. Patient presents to the hospital after syncope and collapse resulted in facial laceration. Patient mentioned for the past 3 weeks he was feeling weak and sometimes dizzy, has also other nonspecific generalized symptoms including easy fatigability and weakness. For the past several days he was having some cough and sputum production so he was presented to his primary care physician yesterday who started him on antibiotics for bronchitis. Today patient was trying to go to the bathroom and when he stood up he felt dizzy and he fell. He had his face on the hardwood floor and had laceration above his right eyebrow. He denies any chest pain, shortness of breath or palpitations. Denies any vertigo type of symptoms.In the ED CT scan of the head/face showed no internal bleeding or fractures, CT angio of the chest showed no evidence of abnormalities.. Has prerenal azotemia consistent with dehydration, WBC count of 15.6.  MRI shows Suggestion of tiny left paracentral pontine infarct.  Clinical Impression  Pt admitted with the above. Pt currently with functional limitations due to the deficits listed below (see PT Problem List). Limited evaluation due to dizziness and noticeable decrease in BP upon standing.  MD notified.  Pt will benefit from skilled PT to increase their independence and safety with mobility to allow discharge to the venue listed below.     PT Assessment  Patient needs continued PT services    Follow Up Recommendations  Home health PT;Supervision/Assistance - 24 hour    Equipment Recommendations  None recommended by PT    Frequency Min 4X/week    Precautions / Restrictions Precautions Precautions: Fall;Other (comment) (check orthostatics)   Pertinent Vitals/Pain No c/o pain; orthostatics noted (see vitals)      Mobility  Bed Mobility Bed Mobility: Supine to Sit Supine to Sit: 4: Min guard;HOB flat;With rails Details for Bed Mobility Assistance: Minguard for safety with reports of dizziness upon upright posture Transfers Transfers: Sit to Stand;Stand to Sit Sit to Stand: 4: Min assist;From bed Stand to Sit: 4: Min assist;To chair/3-in-1 Details for Transfer Assistance: (A) to maintain balance with cues for technique Ambulation/Gait Ambulation/Gait Assistance: 4: Min assist Ambulation Distance (Feet): 5 Feet Assistive device: 1 person hand held assist Ambulation/Gait Assistance Details: (A) to maintain balance with HHA.  Limited ambulation due to dizziness. Gait Pattern: Step-to pattern;Decreased stride length Gait velocity: decreased  General Gait Details: Pt with very guarded gait  Stairs: No    Exercises    PT Diagnosis: Difficulty walking;Generalized weakness  PT Problem List: Decreased strength;Decreased activity tolerance;Decreased balance;Decreased mobility;Decreased knowledge of use of DME;Cardiopulmonary status limiting activity PT Treatment Interventions: DME instruction;Gait training;Stair training;Functional mobility training;Therapeutic activities;Therapeutic exercise;Balance training;Neuromuscular re-education;Patient/family education     PT Goals(Current goals can be found in the care plan section) Acute Rehab PT Goals Patient Stated Goal: To get back to being independent PT Goal Formulation: With patient Time For Goal Achievement: 06/11/13 Potential to Achieve Goals: Good  Visit Information  History of Present Illness: : Lance [name] is a 79 y.o. male with past medical history of hypertension, BPH and sleep apnea. Patient presents to the hospital after syncope and collapse resulted in facial  laceration. Patient mentioned for the past 3 weeks he  was feeling weak and sometimes dizzy, has also other nonspecific generalized symptoms including easy fatigability and weakness. For the past several days he was having some cough and sputum production so he was presented to his primary care physician yesterday who started him on antibiotics for bronchitis. Today patient was trying to go to the bathroom and when he stood up he felt dizzy and he fell. He had his face on the hardwood floor and had laceration above his right eyebrow. He denies any chest pain, shortness of breath or palpitations. Denies any vertigo type of symptoms.In the ED CT scan of the head/face showed no internal bleeding or fractures, CT angio of the chest showed no evidence of abnormalities.. Has prerenal azotemia consistent with dehydration, WBC count of 15.6.  MRI shows Suggestion of tiny left paracentral pontine infarct.       Prior Functioning  Home Living Family/patient expects to be discharged to:: Private residence Living Arrangements: Spouse/significant other Available Help at Discharge: Family Type of Home: House Home Access: Stairs to enter Secretary/administrator of Steps: 3 Entrance Stairs-Rails: Right;Left;Can reach both Home Layout: One level Home Equipment: Walker - standard;Bedside commode Prior Function Level of Independence: Independent Communication Communication: No difficulties Dominant Hand: Right    Cognition  Cognition Arousal/Alertness: Awake/alert Behavior During Therapy: WFL for tasks assessed/performed Overall Cognitive Status: Within Functional Limits for tasks assessed    Extremity/Trunk Assessment Lower Extremity Assessment Lower Extremity Assessment: Overall WFL for tasks assessed   Balance Balance Balance Assessed: Yes Static Standing Balance Static Standing - Balance Support: Right upper extremity supported Static Standing - Level of Assistance: 4: Min assist Static Standing -  Comment/# of Minutes: (A) to maintain upright posture due to multiple directional sway  End of Session PT - End of Session Equipment Utilized During Treatment: Gait belt Activity Tolerance: Patient limited by fatigue;Other (comment) (dizziness) Patient left: in chair;with call bell/phone within reach;with family/visitor present Nurse Communication: Mobility status  GP     Lance [name] 06/04/2013, 11:09 AM  Jake Shark, PT DPT [phone]

## 2013-06-05 LAB — HEMOGLOBIN A1C
Hgb A1c MFr Bld: 5.4 % (ref <5.7)
Mean Plasma Glucose: 108 mg/dL (ref <117)

## 2013-06-05 MED ORDER — LEVOFLOXACIN 500 MG PO TABS: 500.0000 mg | ORAL_TABLET | Freq: Every day | ORAL | Status: DC

## 2013-06-05 MED ORDER — CLOPIDOGREL BISULFATE 75 MG PO TABS: 75.0000 mg | ORAL_TABLET | Freq: Every day | ORAL | Status: DC

## 2013-06-05 NOTE — Progress Notes (Signed)
Pt is being discharged. Pt is being transported home by his wife. Pt was provided with discharge instructions. RN answered all questions.

## 2013-06-05 NOTE — Plan of Care (Signed)
Problem: Phase I Progression Outcomes Goal: Pain controlled with appropriate interventions Outcome: Completed/Met Date Met:  06/05/13

## 2013-06-05 NOTE — Progress Notes (Signed)
Patient resting comfortably, denies any pain or discomfort. Will continue to monitor. Rockie Vawter M  

## 2013-06-05 NOTE — Progress Notes (Signed)
Physical Therapy Treatment Patient Details Name: Lance [name] MRN: [id number] DOB: July 15, 1932 Today's Date: 06/05/2013 Time: 4098-1191 PT Time Calculation (min): 24 min  PT Assessment / Plan / Recommendation  History of Present Illness : Lance [name] is a 79 y.o. male with past medical history of hypertension, BPH and sleep apnea. Patient presents to the hospital after syncope and collapse resulted in facial laceration. Patient mentioned for the past 3 weeks he was feeling weak and sometimes dizzy, has also other nonspecific generalized symptoms including easy fatigability and weakness. For the past several days he was having some cough and sputum production so he was presented to his primary care physician yesterday who started him on antibiotics for bronchitis. Today patient was trying to go to the bathroom and when he stood up he felt dizzy and he fell. He had his face on the hardwood floor and had laceration above his right eyebrow. He denies any chest pain, shortness of breath or palpitations. Denies any vertigo type of symptoms.In the ED CT scan of the head/face showed no internal bleeding or fractures, CT angio of the chest showed no evidence of abnormalities.. Has prerenal azotemia consistent with dehydration, WBC count of 15.6.  MRI shows Suggestion of tiny left paracentral pontine infarct.   PT Comments   Patient able to increase ambulation this session. NO complaints of dizziness. Patient did say that LEs felt more weak and would like the use of RW for longer distance  Follow Up Recommendations  Home health PT;Supervision/Assistance - 24 hour     Does the patient have the potential to tolerate intense rehabilitation     Barriers to Discharge        Equipment Recommendations  None recommended by PT    Recommendations for Other Services    Frequency Min 4X/week   Progress towards PT Goals Progress towards PT goals: Progressing toward goals  Plan Current plan remains  appropriate    Precautions / Restrictions Precautions Precautions: Fall;Other (comment)   Pertinent Vitals/Pain No complaints of pain    Mobility  Bed Mobility Supine to Sit: 7: Independent Transfers Sit to Stand: 5: Supervision Stand to Sit: 5: Supervision Ambulation/Gait Ambulation/Gait Assistance: 5: Supervision Ambulation Distance (Feet): 300 Feet Assistive device: Rolling walker Ambulation/Gait Assistance Details: safe use of RW.  Gait Pattern: Step-through pattern    Exercises     PT Diagnosis:    PT Problem List:   PT Treatment Interventions:     PT Goals (current goals can now be found in the care plan section)    Visit Information  Last PT Received On: 06/05/13 Assistance Needed: +1 History of Present Illness: : Lance [name] is a 79 y.o. male with past medical history of hypertension, BPH and sleep apnea. Patient presents to the hospital after syncope and collapse resulted in facial laceration. Patient mentioned for the past 3 weeks he was feeling weak and sometimes dizzy, has also other nonspecific generalized symptoms including easy fatigability and weakness. For the past several days he was having some cough and sputum production so he was presented to his primary care physician yesterday who started him on antibiotics for bronchitis. Today patient was trying to go to the bathroom and when he stood up he felt dizzy and he fell. He had his face on the hardwood floor and had laceration above his right eyebrow. He denies any chest pain, shortness of breath or palpitations. Denies any vertigo type of symptoms.In the ED CT scan of the head/face showed no internal bleeding  or fractures, CT angio of the chest showed no evidence of abnormalities.. Has prerenal azotemia consistent with dehydration, WBC count of 15.6.  MRI shows Suggestion of tiny left paracentral pontine infarct.    Subjective Data      Cognition  Cognition Arousal/Alertness: Awake/alert Behavior  During Therapy: WFL for tasks assessed/performed Overall Cognitive Status: Within Functional Limits for tasks assessed    Balance     End of Session PT - End of Session Equipment Utilized During Treatment: Gait belt Activity Tolerance: Patient tolerated treatment well Patient left: in chair;with call bell/phone within reach;with family/visitor present Nurse Communication: Mobility status   GP     Fredrich Birks 06/05/2013, 9:14 AM  06/05/2013 Fredrich Birks PTA [phone] pager [phone] office

## 2013-06-05 NOTE — Discharge Summary (Signed)
Physician Discharge Summary  Lance [name] WUJ:[id number] DOB: 05/31/1932 DOA: 06/03/2013  PCP: Ezequiel Kayser, MD  Admit date: 06/03/2013 Discharge date: 06/05/2013  Time spent: 40 minutes  Recommendations for Outpatient Follow-up:  1. Follow up with Dr. Waynard Edwards as an outpatient  Discharge Diagnoses:  Principal Problem:   Syncope and collapse Active Problems:   CVA (cerebral infarction)   Facial laceration   Hypertension   Acute bronchitis   Discharge Condition: stable  Diet recommendation: heart healthy  Filed Weights   06/03/13 1619 06/04/13 0609 06/05/13 0712  Weight: 86.047 kg (189 lb 11.2 oz) 86.7 kg (191 lb 2.2 oz) 89.994 kg (198 lb 6.4 oz)    History of present illness:  79 y.o. male with past medical history of hypertension, BPH and sleep apnea. Patient presents to the hospital after syncope and collapse resulted in facial laceration. Patient mentioned for the past 3 weeks he was feeling weak and sometimes dizzy, has also other nonspecific generalized symptoms including easy fatigability and weakness. For the past several days he was having some cough and sputum production so he was presented to his primary care physician yesterday who started him on antibiotics for bronchitis. Today patient was trying to go to the bathroom and when he stood up he felt dizzy and he fell. He had his face on the hardwood floor and had laceration above his right eyebrow. He denies any chest pain, shortness of breath or palpitations. Denies any vertigo type of symptoms.  In the ED CT scan of the head/face showed no internal bleeding or fractures, CT angio of the chest showed no evidence of abnormalities.. Has prerenal azotemia consistent with dehydration, WBC count of 15.6.   Hospital Course:  *Syncope and collapse due to CVA (cerebral infarction)  - MRI showed small pontine stroke.  - HgbA1c 5.4, fasting lipid panel LDL < 45, HDL  <40, on statin and Niacin. - PT consult, OT consult, Speech  consult. - Echocardiogram grade 1 diastolic heart failure , carotid dopplers: minimal ICA bilaterally. - Prophylactic therapy-Antiplatelet med:use plavix.  - Cardiac Monitoring; no events   Facial laceration  - CT scan of the face didn't show other abnormalities.  -The laceration repaired in the emergency department  - pain control.   Acute bronchitis  -O n IV Levaquin. Change to orals. -Supportive management with bronchodilators, mucolytics, antitussives and oxygen as needed.  - SLP.   Hypertension:  - D/c antihypertensive  medication.   Procedures:  ECHO  MRI  Carotid doppler  Consultations:  none  Discharge Exam: Filed Vitals:   06/05/13 0658  BP: 139/64  Pulse: 77  Temp: 97.9 F (36.6 C)  Resp: 20    General: A&O x3 Cardiovascular: RRR Respiratory: good air movement CTA B/L  Discharge Instructions  Discharge Orders   Future Orders Complete By Expires   Diet - low sodium heart healthy  As directed    Increase activity slowly  As directed        Medication List    STOP taking these medications       aspirin 325 MG tablet     triamterene-hydrochlorothiazide 37.5-25 MG per capsule  Commonly known as:  DYAZIDE      TAKE these medications       buPROPion 150 MG 12 hr tablet  Commonly known as:  WELLBUTRIN SR  Take 150 mg by mouth 2 (two) times daily.     CALCIUM CARBONATE PO  Take 1 tablet by mouth daily.     clopidogrel  75 MG tablet  Commonly known as:  PLAVIX  Take 1 tablet (75 mg total) by mouth daily with breakfast.     COMBIGAN OP  Apply 1 drop to eye 2 (two) times daily.     diazepam 10 MG tablet  Commonly known as:  VALIUM  Take 10 mg by mouth every 8 (eight) hours as needed for anxiety.     doxepin 75 MG capsule  Commonly known as:  SINEQUAN  Take 75 mg by mouth at bedtime.     ergocalciferol 50000 UNITS capsule  Commonly known as:  VITAMIN D2  Take 50,000 Units by mouth once a week. On monday     finasteride 5 MG tablet   Commonly known as:  PROSCAR  Take 10 mg by mouth every morning.     FLOMAX 0.4 MG Caps capsule  Generic drug:  tamsulosin  Take 0.8 mg by mouth every morning.     levofloxacin 500 MG tablet  Commonly known as:  LEVAQUIN  Take 1 tablet (500 mg total) by mouth daily.     multivitamin capsule  Take 1 capsule by mouth once a week.     niacin-lovastatin 1000-20 MG 24 hr tablet  Commonly known as:  ADVICOR  Take 1 tablet by mouth at bedtime.     OMEGA 3 1000 MG Caps  Take 1 capsule by mouth 3 (three) times daily.     TESTOSTERONE IM  Inject into the muscle every 21 ( twenty-one) days.     TRAVATAN OP  Apply 1 drop to eye at bedtime.     vitamin C 500 MG tablet  Commonly known as:  ASCORBIC ACID  Take 500 mg by mouth daily.       Allergies  Allergen Reactions  . Penicillins Anaphylaxis       Follow-up Information   Follow up with PERINI,MARK A, MD In 4 weeks. (hospital follow up)    Specialty:  Internal Medicine   Contact information:   [address] [place] [address] [phone]        The results of significant diagnostics from this hospitalization (including imaging, microbiology, ancillary and laboratory) are listed below for reference.    Significant Diagnostic Studies: Dg Chest 2 View  06/03/2013   *RADIOLOGY REPORT*  Clinical Data: Pain  CHEST - 2 VIEW  Comparison: Prior chest x-ray 04/02/2011  Findings: Stable chronic left basilar atelectasis versus scarring. Cardiac and mediastinal contours within normal limits.  There is atherosclerotic calcification of the transverse aorta.  The lungs are clear.  No acute osseous abnormality.  Mild stable central bronchitic changes.  IMPRESSION: No acute cardiopulmonary disease.   Original Report Authenticated By: Malachy Moan, M.D.   Ct Head Wo Contrast  06/03/2013   *RADIOLOGY REPORT*  Clinical Data:  Fall, scalp laceration  CT HEAD WITHOUT CONTRAST CT MAXILLOFACIAL WITHOUT CONTRAST  Technique:  Multidetector CT  imaging of the head and maxillofacial structures were performed using the standard protocol without intravenous contrast. Multiplanar CT image reconstructions of the maxillofacial structures were also generated.  Comparison:  Most recent prior CT scan of the head 04/02/2011  CT HEAD  Findings: No acute intracranial hemorrhage, acute infarction, mass lesion, mass effect, midline shift or hydrocephalus.  Gray-white differentiation is preserved throughout.  Stable atrophy and mild chronic microvascular ischemic white matter changes. Atherosclerotic calcifications in the bilateral cavernous carotid arteries.  Stable lacunar infarct versus dilated perivascular space in the left sub insular cortex.  No focal scalp hematoma or soft  tissue swelling.  The globes are intact and orbits are symmetric and unremarkable.  Normal aeration of the mastoid air cells and paranasal sinuses.  No acute calvarial abnormality or fracture.  IMPRESSION:  1.  No acute intracranial abnormality. 2. Stable chronic changes.  CT MAXILLOFACIAL  Findings:   No focal scalp hematoma or soft tissue swelling.  No focal soft tissue abnormality.  No facial fracture.  Globes are intact.  Orbits symmetric and unremarkable.  Normal aeration of the paranasal sinuses and mastoid air cells.  Atherosclerotic calcifications in the internal carotid arteries.  IMPRESSION:  No acute fracture or malalignment.   Original Report Authenticated By: Malachy Moan, M.D.   Ct Angio Chest Pe W/cm &/or Wo Cm  06/03/2013   *RADIOLOGY REPORT*  Clinical Data: Chest pain.  CT ANGIOGRAPHY CHEST  Technique:  Multidetector CT imaging of the chest using the standard protocol during bolus administration of intravenous contrast. Multiplanar reconstructed images including MIPs were obtained and reviewed to evaluate the vascular anatomy.  Contrast: 80mL OMNIPAQUE IOHEXOL 350 MG/ML SOLN  Comparison: Chest x-ray earlier today  Findings: Lungs are adequately inflated with minimal  heterogeneous density and mild nodularity over the lingula.  This nodularity measures 1.1 cm and likely represents atelectasis or scarring. There is minimal scarring versus atelectasis over the lateral left lower lobe.  Heart is within normal in size.  There is no evidence of pulmonary embolism.  There is minimal calcification of the left anterior descending coronary artery.  Remaining mediastinal structures are unremarkable.  Images through the upper abdomen are unremarkable.  There is degenerate change of the spine.  IMPRESSION: No acute cardiopulmonary disease and no evidence of pulmonary embolism.  Minimal left basilar and lingular scarring/atelectasis.  Note that there is a mild nodular component measuring 1.1 cm to the probable atelectatic change in the lingula as would recommend a follow-up CT in 3 months.   Original Report Authenticated By: Elberta Fortis, M.D.   Mr Brain Wo Contrast  06/03/2013   *RADIOLOGY REPORT*  Clinical Data: Vertigo and subsequent fall.  MRI HEAD WITHOUT CONTRAST  Technique:  Multiplanar, multiecho pulse sequences of the brain and surrounding structures were obtained according to standard protocol without intravenous contrast.  Comparison: 06/03/2013 CT.  04/03/2011 MR.  Findings: Suggestion of tiny left paracentral pontine infarct.  No intracranial hemorrhage.  Global atrophy without hydrocephalus.  Mild small vessel disease type changes.  No intracranial mass lesion detected on this unenhanced exam.  Major intracranial vascular structures are patent with small right vertebral artery and ectatic left vertebral artery/basilar artery.  IMPRESSION: Suggestion of tiny left paracentral pontine infarct.  No intracranial hemorrhage.  Global atrophy without hydrocephalus.  Mild small vessel disease type changes.   Original Report Authenticated By: Lacy Duverney, M.D.   Ct Maxillofacial Wo Cm  06/03/2013   *RADIOLOGY REPORT*  Clinical Data:  Fall, scalp laceration  CT HEAD WITHOUT CONTRAST  CT MAXILLOFACIAL WITHOUT CONTRAST  Technique:  Multidetector CT imaging of the head and maxillofacial structures were performed using the standard protocol without intravenous contrast. Multiplanar CT image reconstructions of the maxillofacial structures were also generated.  Comparison:  Most recent prior CT scan of the head 04/02/2011  CT HEAD  Findings: No acute intracranial hemorrhage, acute infarction, mass lesion, mass effect, midline shift or hydrocephalus.  Gray-white differentiation is preserved throughout.  Stable atrophy and mild chronic microvascular ischemic white matter changes. Atherosclerotic calcifications in the bilateral cavernous carotid arteries.  Stable lacunar infarct versus dilated perivascular space in the  left sub insular cortex.  No focal scalp hematoma or soft tissue swelling.  The globes are intact and orbits are symmetric and unremarkable.  Normal aeration of the mastoid air cells and paranasal sinuses.  No acute calvarial abnormality or fracture.  IMPRESSION:  1.  No acute intracranial abnormality. 2. Stable chronic changes.  CT MAXILLOFACIAL  Findings:   No focal scalp hematoma or soft tissue swelling.  No focal soft tissue abnormality.  No facial fracture.  Globes are intact.  Orbits symmetric and unremarkable.  Normal aeration of the paranasal sinuses and mastoid air cells.  Atherosclerotic calcifications in the internal carotid arteries.  IMPRESSION:  No acute fracture or malalignment.   Original Report Authenticated By: Malachy Moan, M.D.    Microbiology: No results found for this or any previous visit (from the past 240 hour(s)).   Labs: Basic Metabolic Panel:  Recent Labs Lab 06/03/13 0945 06/04/13 0425  NA 133* 135  K 4.2 3.5  CL 100 104  CO2 21 23  GLUCOSE 149* 129*  BUN 62* 34*  CREATININE 0.88 0.84  CALCIUM 8.4 8.2*   Liver Function Tests:  Recent Labs Lab 06/03/13 0945  AST 11  ALT 10  ALKPHOS 33*  BILITOT 0.3  PROT 5.5*  ALBUMIN 2.9*    No results found for this basename: LIPASE, AMYLASE,  in the last 168 hours No results found for this basename: AMMONIA,  in the last 168 hours CBC:  Recent Labs Lab 06/03/13 0945 06/04/13 0425  WBC 15.6* 15.2*  NEUTROABS 12.8*  --   HGB 12.3* 10.0*  HCT 34.7* 28.9*  MCV 92.3 92.9  PLT 210 209   Cardiac Enzymes:  Recent Labs Lab 06/03/13 1625 06/03/13 2245 06/04/13 0425  TROPONINI <0.30 <0.30 <0.30   BNP: BNP (last 3 results) No results found for this basename: PROBNP,  in the last 8760 hours CBG: No results found for this basename: GLUCAP,  in the last 168 hours     Signed:  Marinda Elk  [facility] 06/05/2013, 7:39 AM

## 2013-06-06 NOTE — Progress Notes (Signed)
Utilization Review Completed.   Linnea Todisco, RN, BSN Nurse Case Manager  [phone]  

## 2013-06-06 NOTE — Progress Notes (Signed)
06/06/13 1448 Noted home health orders for [facility] PT/OT.  Pt. dc yesterday.  This NCM TC to pt. home [phone]), to inquire of interest in home health services.  At this time, the pt. stated that he was not interested, and he would be seeing his PCP this week for follow up. Tera Mater, RN, BSN NCM [phone]

## 2014-11-27 ENCOUNTER — Ambulatory Visit: Payer: Medicare Other | Attending: Orthopaedic Surgery

## 2014-11-27 DIAGNOSIS — R531 Weakness: Secondary | ICD-10-CM | POA: Insufficient documentation

## 2014-11-27 DIAGNOSIS — R269 Unspecified abnormalities of gait and mobility: Secondary | ICD-10-CM | POA: Insufficient documentation

## 2014-11-27 DIAGNOSIS — E559 Vitamin D deficiency, unspecified: Secondary | ICD-10-CM | POA: Insufficient documentation

## 2014-11-27 DIAGNOSIS — I10 Essential (primary) hypertension: Secondary | ICD-10-CM | POA: Diagnosis not present

## 2014-11-27 DIAGNOSIS — Z8673 Personal history of transient ischemic attack (TIA), and cerebral infarction without residual deficits: Secondary | ICD-10-CM | POA: Diagnosis not present

## 2014-11-27 DIAGNOSIS — M8580 Other specified disorders of bone density and structure, unspecified site: Secondary | ICD-10-CM | POA: Insufficient documentation

## 2014-11-27 DIAGNOSIS — M170 Bilateral primary osteoarthritis of knee: Secondary | ICD-10-CM | POA: Insufficient documentation

## 2014-11-27 DIAGNOSIS — Z8781 Personal history of (healed) traumatic fracture: Secondary | ICD-10-CM | POA: Insufficient documentation

## 2014-11-27 DIAGNOSIS — Z7409 Other reduced mobility: Secondary | ICD-10-CM

## 2014-11-27 NOTE — Therapy (Addendum)
[facility] [address] [place], [place], [address] Phone: [phone]   Fax:  [phone]  Physical Therapy Evaluation  Patient Details  Name: Lance [name] MRN: [id number] Date of Birth: November 27, 1931 Referring Provider:  [name]  Encounter Date: 11/27/2014      PT End of Session - 11/27/14 0932    Visit Number 1   Number of Visits --  10 Medicare   Date for PT Re-Evaluation 01/18/15   PT Start Time 0847   PT Stop Time 0926   PT Time Calculation (min) 39 min   Activity Tolerance Patient tolerated treatment well   Behavior During Therapy [facility] for tasks assessed/performed      Past Medical History  Diagnosis Date  . Osteoarthritis   . Hypogonadism male   . BPH (benign prostatic hypertrophy) with urinary obstruction   . Hypertension   . Osteopenia   . Vitamin D deficiency   . Anxiety   . Depression   . Diverticulosis   . Colon polyps   . Hypercholesterolemia   . Glaucoma   . Sleep apnea   . Alcohol abuse     Remote hx, stopped in 1970  . History of pneumonia     Past Surgical History  Procedure Laterality Date  . Tonsillectomy    . Appendectomy  1945  . Shoulder surgery      rt  . Eye surgery      There were no vitals taken for this visit.  Visit Diagnosis:  Weakness generalized - Plan: PT plan of care cert/re-cert  Abnormality of gait - Plan: PT plan of care cert/re-cert  Decreased strength, endurance, and mobility - Plan: PT plan of care cert/re-cert      Subjective Assessment - 11/27/14 0854    Symptoms Pt is an 81 y.o. male who presents to PT with LE weakness and general deconditioning.     Pertinent History Pt had 5th metatarsal fracture of Rt foot 6 months ago and has had limited mobility during this time leading to weakness and deconditioning.  Pt had dose pack of steroids last week.  Injections into knees (Synovisc?)   Limitations Standing;Walking   How long  can you stand comfortably? Limited to 10 minutes   How long can you walk comfortably? Limited to 10 minutes due to endurance deficits   Diagnostic tests X-ray and MRI per pt report- OA in bilateral knees per pt report   Patient Stated Goals Pt would like to strengthen legs to become more steady with gait and to avoid knee replacement surgery   Currently in Pain? Yes   Pain Score 2    Pain Location Knee   Pain Orientation Left;Right   Pain Type Chronic pain   Pain Onset More than a month ago   Pain Frequency Intermittent   Aggravating Factors  steps, walking   Pain Relieving Factors rest   Effect of Pain on Daily Activities limited standing and walking   Multiple Pain Sites No          [facility] PT Assessment - 11/27/14 0001    Assessment   Medical Diagnosis generalized deconditioning, LE weakness, OA bil. knees   Onset Date 05/28/14   Precautions   Precautions Fall   Restrictions   Weight Bearing Restrictions No   Balance Screen   Has the patient fallen in the past 6 months Yes   How many times? 4   Has the patient had a decrease in activity  level because of a fear of falling?  Yes   Is the patient reluctant to leave their home because of a fear of falling?  Yes   [place] Private residence   Living Arrangements Spouse/significant other   Home Access Stairs to enter   Entrance Stairs-Number of Steps 4   [place] One level   [place] - single point;Walker - 2 wheels   Prior Function   Level of Independence Independent with basic ADLs   Vocation Retired   Associate Professor   Overall Cognitive Status Within Functional Limits for tasks assessed   Observation/Other Assessments   Focus on Therapeutic Outcomes (FOTO)  68% limitation   Posture/Postural Control   Posture/Postural Control Postural limitations   Postural Limitations Rounded Shoulders;Forward head;Flexed trunk   ROM / Strength   AROM / PROM /  Strength Strength   Strength   Overall Strength Deficits   Overall Strength Comments bilateral LE strength: hipes 4/5, knee 4+/5, ankle 4+/5.  UE 4 to 4+/5 throughout.   Strength Assessment Site Hip;Knee;Shoulder   Palpation   Palpation NA   Transfers   Transfers Sit to Stand   Sit to Stand 6: Modified independent (Device/Increase time)  Max UE support   Ambulation/Gait   Ambulation/Gait Yes   Ambulation Distance (Feet) 50 Feet   Assistive device Straight cane   Gait Pattern Decreased step length - right;Decreased step length - left;Decreased dorsiflexion - right;Decreased dorsiflexion - left;Right flexed knee in stance;Left flexed knee in stance   Ambulation Surface Level   Standardized Balance Assessment   Standardized Balance Assessment Timed Up and Go Test   Timed Up and Go Test   TUG Normal TUG   Normal TUG (seconds) 15   TUG Comments no cane used   Functional Gait  Assessment   Gait assessed  Yes  see above                          PT Education - 11/27/14 0922    Education provided Yes   Education Details HEP-seated stregnth   Person(s) Educated Patient   Methods Explanation;Demonstration;Handout;Verbal cues   Comprehension Verbalized understanding;Returned demonstration          PT Short Term Goals - 11/27/14 0935    PT SHORT TERM GOAL #1   Title be independent in initial HEP    Time 4   Period Weeks   Status New   PT SHORT TERM GOAL #2   Title improve LE strength to perform sit to stand from regular height chair with moderate UE support   Time 4   Period Weeks   Status New   PT SHORT TERM GOAL #3   Title improve endurance to stand and walk for 15 minutes without need to rest   Time 4   Period Weeks   Status New           PT Long Term Goals - 11/27/14 0846    PT LONG TERM GOAL #1   Title be independent in advanced HEP   Time 8   Period Weeks   Status New   PT LONG TERM GOAL #2   Title reduce FOTO to < or = to 50% limitation    PT LONG TERM GOAL #3   Title improve LE strength to perform sit to stand with minimal to no UE support > 50% of the time from regular height  chair   Time 8   Period Weeks   Status New   PT LONG TERM GOAL #4   Title perform TUG in < or = to 12 seconds   Time 8   Period Weeks   Status New   PT LONG TERM GOAL #5   Title imrpove endurance to tolerate standing and walking fro 20 minutes without need for rest   Time 8   Period Weeks   Status New               Plan - Dec 06, 2014 0932    Clinical Impression Statement Pt presents with overall endurance and strength deficits after 6 month period of immobility after metatarsal fracture.  Pt would like to learn exercises for strength and endurance and transition to HEP/gym program at [employer]   Pt will benefit from skilled therapeutic intervention in order to improve on the following deficits Abnormal gait;Impaired flexibility;Decreased mobility;Improper body mechanics;Decreased endurance;Decreased strength;Difficulty walking;Decreased balance   Rehab Potential Good   PT Frequency 2x / week   PT Duration 8 weeks   PT Treatment/Interventions ADLs/Self Care Home Management;Gait training;Therapeutic exercise;Patient/family education;Stair training;Balance training;Neuromuscular re-education;Therapeutic activities   PT Next Visit Plan UE/LE strength and endurance, build gym program (Pt wants to return to [facility]), gait and balance training   Consulted and Agree with Plan of Care Patient          G-Codes - 2014-12-06 0846    Functional Assessment Tool Used FOTO 68% limitation   Functional Limitation Mobility: Walking and moving around   Mobility: Walking and Moving Around Current Status (D3143) At least 60 percent but less than 80 percent impaired, limited or restricted   Mobility: Walking and Moving Around Goal Status [phone]) At least 40 percent but less than 60 percent impaired, limited or restricted       Problem List Patient  Active Problem List   Diagnosis Date Noted  . CVA (cerebral infarction) 06/03/2013  . Syncope and collapse 06/03/2013  . Facial laceration 06/03/2013  . Acute bronchitis 06/03/2013  . Hypertension     Fatim Vanderschaaf, PT December 06, 2014, 10:05 AM  [facility] [address], [place], [place], [address] Phone: [phone]   Fax:  [phone]

## 2014-11-27 NOTE — Patient Instructions (Signed)
KNEE: Extension, Long Arc Quad (Weight)  Place weight around leg. Raise leg until knee is straight. Hold _5__ seconds. Use ___ lb weight. _10__ reps per set (each leg), 4-5__ sets per day, __7_ days per week  Copyright  VHI. All rights reserved.    Reverse Fly / Shoulder Retraction   Extend both arms in front of body at shoulder height, palms down, holding band. Move arms out to sides, squeeze shoulder blades together. Repeat _10__ times. Do _4-5__ sessions per day.   Copyright  VHI. All rights reserved.     Knee Raise   Lift knee and then lower it. Repeat with other knee. Repeat _10__ times each leg. Do _4-5___ sessions per day.  http://gt2.exer.us/445   Copyright  VHI. All rights reserved.  Toe Up   Gently rise up on toes and back on heels. Repeat _20___ times. Do 4-5____ sessions per day.  http://gt2.exer.us/455   Copyright  VHI. All rights reserved.

## 2014-11-30 ENCOUNTER — Encounter: Payer: Self-pay | Admitting: Physical Therapy

## 2014-11-30 ENCOUNTER — Ambulatory Visit: Payer: Medicare Other | Admitting: Physical Therapy

## 2014-11-30 DIAGNOSIS — R269 Unspecified abnormalities of gait and mobility: Secondary | ICD-10-CM

## 2014-11-30 DIAGNOSIS — R531 Weakness: Secondary | ICD-10-CM

## 2014-11-30 DIAGNOSIS — Z7409 Other reduced mobility: Secondary | ICD-10-CM

## 2014-11-30 DIAGNOSIS — R6889 Other general symptoms and signs: Secondary | ICD-10-CM

## 2014-11-30 NOTE — Therapy (Signed)
[facility] [address], [place], [place], [address] Phone: [phone]   Fax:  [phone]  Physical Therapy Treatment  Patient Details  Name: Lance [name] MRN: [id number] Date of Birth: 08-26-1932 Referring Provider:  Jerlyn Ly, MD  Encounter Date: 11/30/2014      PT End of Session - 11/30/14 0849    Visit Number 2   Number of Visits 10  Medicare   Date for PT Re-Evaluation 01/18/15   PT Start Time 0839   PT Stop Time 0925   PT Time Calculation (min) 46 min   Activity Tolerance Patient tolerated treatment well   Behavior During Therapy [facility] for tasks assessed/performed      Past Medical History  Diagnosis Date  . Osteoarthritis   . Hypogonadism male   . BPH (benign prostatic hypertrophy) with urinary obstruction   . Hypertension   . Osteopenia   . Vitamin D deficiency   . Anxiety   . Depression   . Diverticulosis   . Colon polyps   . Hypercholesterolemia   . Glaucoma   . Sleep apnea   . Alcohol abuse     Remote hx, stopped in 1970  . History of pneumonia     Past Surgical History  Procedure Laterality Date  . Tonsillectomy    . Appendectomy  1945  . Shoulder surgery      rt  . Eye surgery      There were no vitals taken for this visit.  Visit Diagnosis:  Weakness generalized  Abnormality of gait  Decreased strength, endurance, and mobility      Subjective Assessment - 11/30/14 0937    Patient Stated Goals Pt would like to strengthen legs to become more steady with gait and to avoid knee replacement surgery                    [facility] Adult PT Treatment/Exercise - 11/30/14 0001    Ambulation/Gait   Gait Comments No cane used for all ambulation needed at gym   Exercises   Exercises Knee/Hip;Shoulder   Knee/Hip Exercises: Aerobic   Stationary Bike 15 x L1   Knee/Hip Exercises: Standing   Other Standing Knee Exercises Trampoline 3 directions x 31min each   Shoulder Exercises: Seated   Other Seated Exercises 3 way raises with 2# x 10, incr to 3#  3 x 10   Knee/Hip Exercises: Machines for Strengthening   Cybex Leg Press St 8 90# 3x10 with B LE, 45# 3 x 10 unilaeral                PT Education - 11/30/14 [phone]    Education provided Yes   Education Details 3 way raises with 3# advised to incr up to 5#   Person(s) Educated Patient   Methods Explanation;Demonstration;Handout   Comprehension Verbalized understanding;Returned demonstration          PT Short Term Goals - 11/27/14 0935    PT SHORT TERM GOAL #1   Title be independent in initial HEP    Time 4   Period Weeks   Status New   PT SHORT TERM GOAL #2   Title improve LE strength to perform sit to stand from regular height chair with moderate UE support   Time 4   Period Weeks   Status New   PT SHORT TERM GOAL #3   Title improve endurance to stand and walk for 15 minutes without need to rest  Time 4   Period Weeks   Status New           PT Long Term Goals - 11/27/14 0846    PT LONG TERM GOAL #1   Title be independent in advanced HEP   Time 8   Period Weeks   Status New   PT LONG TERM GOAL #2   Title reduce FOTO to < or = to 50% limitation   PT LONG TERM GOAL #3   Title improve LE strength to perform sit to stand with minimal to no UE support > 50% of the time from regular height chair   Time 8   Period Weeks   Status New   PT LONG TERM GOAL #4   Title perform TUG in < or = to 12 seconds   Time 8   Period Weeks   Status New   PT LONG TERM GOAL #5   Title imrpove endurance to tolerate standing and walking fro 20 minutes without need for rest   Time 8   Period Weeks   Status New               Plan - 11/30/14 0920    Clinical Impression Statement Pt presents with overall endurance and strength deficits after 6 month of immobility due to metatarsal fracture x 2 on Rt foot. Pt. wishes to improve confidence with ambulation, incr. endurance and  strength to be able transition into HEP/gym program at [facility]    Pt will benefit from skilled therapeutic intervention in order to improve on the following deficits Abnormal gait;Impaired flexibility;Decreased mobility;Improper body mechanics;Decreased endurance;Decreased strength;Difficulty walking;Decreased balance   Rehab Potential Good   PT Frequency 2x / week   PT Duration 8 weeks   PT Treatment/Interventions ADLs/Self Care Home Management;Gait training;Therapeutic exercise;Patient/family education;Stair training;Balance training;Neuromuscular re-education;Therapeutic activities   PT Next Visit Plan UE/LE strength and endurance, build gym program (Pt wants to return to [facility]), gait and balance training   Consulted and Agree with Plan of Care Patient        Problem List Patient Active Problem List   Diagnosis Date Noted  . CVA (cerebral infarction) 06/03/2013  . Syncope and collapse 06/03/2013  . Facial laceration 06/03/2013  . Acute bronchitis 06/03/2013  . Hypertension     NAUMANN-HOUEGNIFIO,Sheela Mcculley PTA 11/30/2014, 9:39 AM  [facility] [address] [place], [place], [address] Phone: [phone]   Fax:  [phone]

## 2014-11-30 NOTE — Patient Instructions (Signed)
SHOULDER: Flexion Unilateral (Weight)   Start with arm at side. Raise arm forward and up. Keep elbow straight. Hold _2__ seconds. Use ___ lb weight. _10__ reps per set, 2___ sets per day, _5__ days per week Use ___ lb dumbbell.  Copyright  VHI. All rights reserved.  SHOULDER: Abduction (Weight)   Raise arm out and up. Keep elbow straight. Do not shrug shoulders. Hold _2__ seconds. Use ___ lb weight. _10__ reps per set, _3__ sets per day, _5__ days per week Use ___ lb dumbbell.  Copyright  VHI. All rights reserved.  SHOULDER: Scaption (Weight)   Place arm at 45 angle to body. Raise arm slightly above shoulder keeping elbow straight. Hold _2__ seconds. Use ___ lb weight. _10__ reps per set, _3__ sets per day, 7___ days per week Use ___ lb dumbbell.

## 2014-12-05 ENCOUNTER — Ambulatory Visit: Payer: Medicare Other | Admitting: Physical Therapy

## 2014-12-05 ENCOUNTER — Encounter: Payer: Self-pay | Admitting: Physical Therapy

## 2014-12-05 DIAGNOSIS — R531 Weakness: Secondary | ICD-10-CM

## 2014-12-05 DIAGNOSIS — R6889 Other general symptoms and signs: Secondary | ICD-10-CM

## 2014-12-05 DIAGNOSIS — Z7409 Other reduced mobility: Secondary | ICD-10-CM

## 2014-12-05 DIAGNOSIS — R269 Unspecified abnormalities of gait and mobility: Secondary | ICD-10-CM

## 2014-12-05 NOTE — Therapy (Addendum)
[facility] [address], [place], [place], [address] Phone: [phone]   Fax:  [phone]  Physical Therapy Treatment  Patient Details  Name: Lance [name] MRN: [id number] Date of Birth: 1932/03/24 Referring Provider:  [name]  Encounter Date: 12/05/2014      PT End of Session - 12/05/14 0853    Visit Number 3   Number of Visits 10  Medicare   Date for PT Re-Evaluation 01/18/15   PT Start Time 0801   PT Stop Time 0846   PT Time Calculation (min) 45 min   Activity Tolerance Patient tolerated treatment well   Behavior During Therapy [facility] for tasks assessed/performed      Past Medical History  Diagnosis Date  . Osteoarthritis   . Hypogonadism male   . BPH (benign prostatic hypertrophy) with urinary obstruction   . Hypertension   . Osteopenia   . Vitamin D deficiency   . Anxiety   . Depression   . Diverticulosis   . Colon polyps   . Hypercholesterolemia   . Glaucoma   . Sleep apnea   . Alcohol abuse     Remote hx, stopped in 1970  . History of pneumonia     Past Surgical History  Procedure Laterality Date  . Tonsillectomy    . Appendectomy  1945  . Shoulder surgery      rt  . Eye surgery      There were no vitals taken for this visit.  Visit Diagnosis:  Weakness generalized  Abnormality of gait  Decreased strength, endurance, and mobility      Subjective Assessment - 12/05/14 0804    Symptoms Pt reports he feels improvement with strength and endurance   Pertinent History Pt had 5th metatarsal fracture of Rt foot 6 months ago and has had limited mobility during this time leading to weakness and deconditioning.  Pt had dose pack of steroids last week.  Injections into knees (Synovisc?)   Limitations Standing;Walking   How long can you stand comfortably? Limited to 88min   How long can you walk comfortably? Walking distance is limited to 15 min   Patient Stated  Goals Pt would like to strengthen legs to become more steady with gait and to avoid knee replacement surgery   Currently in Pain? Yes   Pain Score 3    Pain Location Knee   Pain Orientation Right;Left   Pain Type Chronic pain   Pain Onset More than a month ago   Pain Frequency Intermittent   Aggravating Factors  steps and walking   Pain Relieving Factors rest   Effect of Pain on Daily Activities limited standing and walking   Multiple Pain Sites No                              PT Short Term Goals - 12/05/14 0854    PT SHORT TERM GOAL #1   Title be independent in initial HEP    Period Weeks   Status On-going   PT SHORT TERM GOAL #2   Title improve LE strength to perform sit to stand from regular height chair with moderate UE support   Time 4   Period Weeks   Status On-going   PT SHORT TERM GOAL #3   Title improve endurance to stand and walk for 15 minutes without need to rest   Time 4   Period Weeks  Status On-going           PT Long Term Goals - 12/05/14 0903    PT LONG TERM GOAL #4   Time 8               Plan - 12/06/14 0953    Clinical Impression Statement Pt presents with overall endurance, strength deficits after 6 month of immobility due to metatarsal fracture x 2 on Rt foot. Pt. goal is to improve confidence with ambulation, incr.endurance and strength to be able to participate in gym program at [facility]   Pt will benefit from skilled therapeutic intervention in order to improve on the following deficits Abnormal gait;Impaired flexibility;Decreased mobility;Improper body mechanics;Decreased endurance;Decreased strength;Difficulty walking;Decreased balance   Rehab Potential Good   PT Frequency 2x / week   PT Duration 8 weeks   PT Treatment/Interventions ADLs/Self Care Home Management;Gait training;Therapeutic exercise;Patient/family education;Stair training;Balance training;Neuromuscular re-education;Therapeutic activities   PT  Next Visit Plan UE/LE strength and endurance, build gym program (Pt wants to return to [facility]), gait and balance training   Consulted and Agree with Plan of Care Patient        Problem List Patient Active Problem List   Diagnosis Date Noted  . CVA (cerebral infarction) 06/03/2013  . Syncope and collapse 06/03/2013  . Facial laceration 06/03/2013  . Acute bronchitis 06/03/2013  . Hypertension     NAUMANN-HOUEGNIFIO,Abigael Mogle PTA 12/06/2014, 9:57 AM  [facility] [address] [place], [place], [address] Phone: [phone]   Fax:  [phone]

## 2014-12-07 ENCOUNTER — Encounter: Payer: Self-pay | Admitting: Physical Therapy

## 2014-12-07 ENCOUNTER — Ambulatory Visit: Payer: Medicare Other | Admitting: Physical Therapy

## 2014-12-07 DIAGNOSIS — R531 Weakness: Secondary | ICD-10-CM | POA: Diagnosis not present

## 2014-12-07 DIAGNOSIS — Z7409 Other reduced mobility: Secondary | ICD-10-CM

## 2014-12-07 DIAGNOSIS — R6889 Other general symptoms and signs: Secondary | ICD-10-CM

## 2014-12-07 NOTE — Therapy (Signed)
[facility] [address], [place], [place], [address] Phone: [phone]   Fax:  [phone]  Physical Therapy Treatment  Patient Details  Name: Lance [name] MRN: [id number] Date of Birth: 1932-07-24 Referring Provider:  [name]  Encounter Date: 12/07/2014      PT End of Session - 12/07/14 0917    Visit Number 4   Number of Visits 10   Date for PT Re-Evaluation 01/18/15   PT Start Time 0845   PT Stop Time 0930   PT Time Calculation (min) 45 min   Activity Tolerance Patient tolerated treatment well   Behavior During Therapy [facility] for tasks assessed/performed      Past Medical History  Diagnosis Date  . Osteoarthritis   . Hypogonadism male   . BPH (benign prostatic hypertrophy) with urinary obstruction   . Hypertension   . Osteopenia   . Vitamin D deficiency   . Anxiety   . Depression   . Diverticulosis   . Colon polyps   . Hypercholesterolemia   . Glaucoma   . Sleep apnea   . Alcohol abuse     Remote hx, stopped in 1970  . History of pneumonia     Past Surgical History  Procedure Laterality Date  . Tonsillectomy    . Appendectomy  1945  . Shoulder surgery      rt  . Eye surgery      There were no vitals filed for this visit.  Visit Diagnosis:  Weakness generalized  Decreased strength, endurance, and mobility      Subjective Assessment - 12/07/14 0857    Symptoms No device in home now.    Currently in Pain? Yes   Pain Score 2    Pain Location Knee   Pain Orientation Right;Left   Pain Descriptors / Indicators Aching   Pain Type Chronic pain   Aggravating Factors  Steps   Pain Relieving Factors rest   Multiple Pain Sites No                       [facility] Adult PT Treatment/Exercise - 12/07/14 0001    Transfers   Transfers --  Sit to stand with 3# BUE 10x2 , added bicep curl on second   Knee/Hip Exercises: Aerobic   Stationary Bike 35min L1Life  Cycle seat # 20    Knee/Hip Exercises: Machines for Strengthening   Cybex Leg Press St8 105# bil, Uni 50# 3x10 all   Knee/Hip Exercises: Standing   Other Standing Knee Exercises Trampoline 3 directions x 55min each  b UE support   Shoulder Exercises: Seated   Other Seated Exercises 3way raise bil UE 3# 2x10                  PT Short Term Goals - 12/05/14 6283    PT SHORT TERM GOAL #1   Title be independent in initial HEP    Period Weeks   Status On-going   PT SHORT TERM GOAL #2   Title improve LE strength to perform sit to stand from regular height chair with moderate UE support   Time 4   Period Weeks   Status On-going   PT SHORT TERM GOAL #3   Title improve endurance to stand and walk for 15 minutes without need to rest   Time 4   Period Weeks   Status On-going           PT Long  Term Goals - 12/05/14 0903    PT LONG TERM GOAL #4   Time 8               Plan - 12/07/14 0917    Clinical Impression Statement Tolerated all increases in activity today without signs of any real fatigue. Ambulated throughout gym without device.   Pt will benefit from skilled therapeutic intervention in order to improve on the following deficits Abnormal gait;Impaired flexibility;Decreased mobility;Improper body mechanics;Decreased endurance;Decreased strength;Difficulty walking;Decreased balance   Rehab Potential Good   PT Frequency 2x / week   PT Duration 8 weeks   PT Treatment/Interventions ADLs/Self Care Home Management;Gait training;Therapeutic exercise;Patient/family education;Stair training;Balance training;Neuromuscular re-education;Therapeutic activities   PT Next Visit Plan UE/LE strength and endurance, build gym program (Pt wants to return to [facility]), gait and balance training   Consulted and Agree with Plan of Care Patient        Problem List Patient Active Problem List   Diagnosis Date Noted  . CVA (cerebral infarction) 06/03/2013  . Syncope and  collapse 06/03/2013  . Facial laceration 06/03/2013  . Acute bronchitis 06/03/2013  . Hypertension     Takeshi Teasdale, PTA 12/07/2014, 9:19 AM  [facility] [address], [place], [place], [address] Phone: [phone]   Fax:  [phone]

## 2014-12-10 ENCOUNTER — Ambulatory Visit: Payer: Medicare Other | Admitting: Physical Therapy

## 2014-12-10 ENCOUNTER — Encounter: Payer: Self-pay | Admitting: Physical Therapy

## 2014-12-10 DIAGNOSIS — R531 Weakness: Secondary | ICD-10-CM

## 2014-12-10 DIAGNOSIS — Z7409 Other reduced mobility: Secondary | ICD-10-CM

## 2014-12-10 DIAGNOSIS — R269 Unspecified abnormalities of gait and mobility: Secondary | ICD-10-CM
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
## 2014-12-14 ENCOUNTER — Encounter: Payer: Self-pay | Admitting: Physical Therapy

## 2014-12-14 ENCOUNTER — Ambulatory Visit: Payer: Medicare Other | Admitting: Physical Therapy

## 2014-12-14 DIAGNOSIS — R269 Unspecified abnormalities of gait and mobility: Secondary | ICD-10-CM

## 2014-12-14 DIAGNOSIS — Z7409 Other reduced mobility: Secondary | ICD-10-CM

## 2014-12-14 DIAGNOSIS — R531 Weakness: Secondary | ICD-10-CM

## 2014-12-14 NOTE — Therapy (Signed)
[facility] [address] [place], [place], [address] Phone: [phone]   Fax:  [phone]  Physical Therapy Treatment  Patient Details  Name: Lance [name] MRN: [id number] Date of Birth: [birth year] Referring Provider:  [name]  Encounter Date: 12/14/2014      PT End of Session - 12/14/14 1200    Visit Number 6   Number of Visits 10   Date for PT Re-Evaluation 01/18/15   PT Start Time 1010   PT Stop Time 1054   PT Time Calculation (min) 44 min   Activity Tolerance Patient tolerated treatment well   Behavior During Therapy [facility] for tasks assessed/performed      Past Medical History  Diagnosis Date  . Osteoarthritis   . Hypogonadism male   . BPH (benign prostatic hypertrophy) with urinary obstruction   . Hypertension   . Osteopenia   . Vitamin D deficiency   . Anxiety   . Depression   . Diverticulosis   . Colon polyps   . Hypercholesterolemia   . Glaucoma   . Sleep apnea   . Alcohol abuse     Remote hx, stopped in 1970  . History of pneumonia     Past Surgical History  Procedure Laterality Date  . Tonsillectomy    . Appendectomy  1945  . Shoulder surgery      rt  . Eye surgery      There were no vitals filed for this visit.  Visit Diagnosis:  Weakness generalized  Decreased strength, endurance, and mobility  Abnormality of gait      Subjective Assessment - 12/14/14 1038    Symptoms Pt reports negotiating stairs is improved, but still using crutch for safety, transition from sitting to standing is better   Pertinent History Pt had 5th metatarsal fracture of Rt foot 6 months ago and has had limited mobility during this time leading to weakness and deconditioning.  Pt had dose pack of steroids last week.  Injections into knees (Synovisc?)   Limitations Sitting;Standing;Walking   How long can you stand comfortably? 20-30 minutes   How long can you walk comfortably?  20-31minutes   Diagnostic tests X-ray and MRI per pt report- OA in bilateral knees per pt report   Patient Stated Goals Pt would like to strengthen legs to become more steady with gait and to avoid knee replacement surgery                       [facility] Adult PT Treatment/Exercise - 12/14/14 0001    Transfers   Transfers Sit to Stand  1 min x 23, 70min x 17 but fatique at end of task   Ambulation/Gait   Ambulation Distance (Feet) 200 Feet  all distance needed in clinic   Assistive device --  cane used in community   Ambulation Surface Level   Gait Comments No cane used for all ambulation needed at PT clinic   Knee/Hip Exercises: Aerobic   Stationary Bike 72min L1Life Cycle seat # 20    Knee/Hip Exercises: Machines for Strengthening   Cybex Leg Press St8 110# B LE 3x10, 55# each leg 3x10   improvement with placement of feet on footplate   Knee/Hip Exercises: Standing   Other Standing Knee Exercises Trampoline 3 directions x 52min each  b UE support   Shoulder Exercises: Seated   Other Seated Exercises 3way raises 3# 2x10 sitting on silver disc  PT Short Term Goals - 12/10/14 1512    PT SHORT TERM GOAL #1   Title be independent in initial HEP    Time 4   Status Achieved   PT SHORT TERM GOAL #2   Title improve LE strength to perform sit to stand from regular height chair with moderate UE support   Time 4   Period Weeks   Status Achieved   PT SHORT TERM GOAL #3   Title improve endurance to stand and walk for 15 minutes without need to rest   Time 4   Status Achieved           PT Long Term Goals - 12/05/14 0903    PT LONG TERM GOAL #4   Time 8               Plan - 12/14/14 1201    Clinical Impression Statement Pt showed major improvement today, able to incr weight on leg press, increased stamina and endurance    Pt will benefit from skilled therapeutic intervention in order to improve on the following deficits Abnormal  gait;Impaired flexibility;Decreased mobility;Improper body mechanics;Decreased endurance;Decreased strength;Difficulty walking;Decreased balance   Rehab Potential Good   PT Frequency 2x / week   PT Duration 8 weeks   PT Treatment/Interventions ADLs/Self Care Home Management;Gait training;Therapeutic exercise;Patient/family education;Stair training;Balance training;Neuromuscular re-education;Therapeutic activities   PT Next Visit Plan UE/LE strength and endurance, build gym program (Pt wants to return to [employer]), gait and balance training   Consulted and Agree with Plan of Care Patient        Problem List Patient Active Problem List   Diagnosis Date Noted  . CVA (cerebral infarction) 06/03/2013  . Syncope and collapse 06/03/2013  . Facial laceration 06/03/2013  . Acute bronchitis 06/03/2013  . Hypertension     [name],[name] 12/14/2014, 12:07 PM  [facility] [address], [place], [place], [address] Phone: [phone]   Fax:  [phone]

## 2014-12-20 ENCOUNTER — Ambulatory Visit: Payer: Medicare Other

## 2014-12-20 DIAGNOSIS — Z7409 Other reduced mobility: Secondary | ICD-10-CM

## 2014-12-20 DIAGNOSIS — R531 Weakness: Secondary | ICD-10-CM | POA: Diagnosis not present

## 2014-12-20 DIAGNOSIS — R269 Unspecified abnormalities of gait and mobility: Secondary | ICD-10-CM
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
## 2014-12-24 ENCOUNTER — Encounter: Payer: Self-pay | Admitting: Physical Therapy

## 2014-12-24 ENCOUNTER — Ambulatory Visit: Payer: Medicare Other | Admitting: Physical Therapy

## 2014-12-24 DIAGNOSIS — R531 Weakness: Secondary | ICD-10-CM

## 2014-12-24 DIAGNOSIS — R269 Unspecified abnormalities of gait and mobility: Secondary | ICD-10-CM

## 2014-12-24 DIAGNOSIS — R6889 Other general symptoms and signs: Secondary | ICD-10-CM

## 2014-12-24 DIAGNOSIS — Z7409 Other reduced mobility: Secondary | ICD-10-CM
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
## 2014-12-27 ENCOUNTER — Ambulatory Visit: Payer: Medicare Other

## 2014-12-27 DIAGNOSIS — R531 Weakness: Secondary | ICD-10-CM | POA: Diagnosis not present

## 2014-12-27 DIAGNOSIS — Z7409 Other reduced mobility: Secondary | ICD-10-CM

## 2014-12-27 DIAGNOSIS — R6889 Other general symptoms and signs: Secondary | ICD-10-CM

## 2014-12-27 DIAGNOSIS — R269 Unspecified abnormalities of gait and mobility: Secondary | ICD-10-CM

## 2014-12-27 NOTE — Therapy (Addendum)
[facility] [address], [place], [place], [address] Phone: [phone]   Fax:  [phone]  Physical Therapy Treatment  Patient Details  Name: Lance [name] MRN: [id number] Date of Birth: 23-Aug-1932 Referring Provider:  [name]  Encounter Date: 12/27/2014      PT End of Session - 12/27/14 1004    Visit Number 9   Number of Visits 10  Medicare   Date for PT Re-Evaluation 01/18/15   PT Start Time 0929   PT Stop Time 1012   PT Time Calculation (min) 43 min   Activity Tolerance Patient tolerated treatment well   Behavior During Therapy [facility] for tasks assessed/performed      Past Medical History  Diagnosis Date  . Osteoarthritis   . Hypogonadism male   . BPH (benign prostatic hypertrophy) with urinary obstruction   . Hypertension   . Osteopenia   . Vitamin D deficiency   . Anxiety   . Depression   . Diverticulosis   . Colon polyps   . Hypercholesterolemia   . Glaucoma   . Sleep apnea   . Alcohol abuse     Remote hx, stopped in 1970  . History of pneumonia     Past Surgical History  Procedure Laterality Date  . Tonsillectomy    . Appendectomy  1945  . Shoulder surgery      rt  . Eye surgery      There were no vitals filed for this visit.  Visit Diagnosis:  Weakness generalized  Decreased strength, endurance, and mobility  Abnormality of gait      Subjective Assessment - 12/27/14 0937    Symptoms Pt reports soreness in bilateral knees.  Pt reports that he uses his cane for comfort.     Currently in Pain? Yes   Pain Score 2    Pain Location Knee   Pain Orientation Right;Left   Pain Descriptors / Indicators Sore            [facility] PT Assessment - 12/27/14 0001    Assessment   Medical Diagnosis generalized deconditioning, LE weakness, OA bil knees   Observation/Other Assessments   Focus on Therapeutic Outcomes (FOTO)  58% limitation                   [facility] Adult PT Treatment/Exercise - 12/27/14 0001    Knee/Hip Exercises: Aerobic   Stationary Bike 17min L1Life Cycle seat # 20    Knee/Hip Exercises: Standing   Walking with Sports Cord forward and reverse 20# x 10 each, sidestepping x 5 each  CGA by PT for safety   Other Standing Knee Exercises Trampoline 3 directions x 69min each  b UE support   Shoulder Exercises: Seated   Other Seated Exercises 3way raises 4# 2x10 sitting on silver disc  Pt tolerated increased weight well today   Shoulder Exercises: ROM/Strengthening   UBE (Upper Arm Bike) Level 1x 6 (3/3) seated                  PT Short Term Goals - 12/24/14 0901    PT SHORT TERM GOAL #1   Title be independent in initial HEP    Time 4   Period Weeks   Status Achieved   PT SHORT TERM GOAL #2   Title improve LE strength to perform sit to stand from regular height chair with moderate UE support   Time 4   Period Weeks   Status Achieved  PT SHORT TERM GOAL #3   Title improve endurance to stand and walk for 15 minutes without need to rest   Time 4   Period Weeks   Status Achieved           PT Long Term Goals - 12/24/14 3295    PT LONG TERM GOAL #4   Title perform TUG in < or = to 12 seconds   Time 8   Period Weeks   Status Achieved   PT LONG TERM GOAL #5   Title imrpove endurance to tolerate standing and walking fro 20 minutes without need for rest   Time 8   Period Weeks   Status Achieved               Plan - 12/27/14 0947    Clinical Impression Statement Pt with continued LE and UE weakness and endurance deficits.  Pt tolerates increased weight and reps well with exercise in the clinic.     Pt will benefit from skilled therapeutic intervention in order to improve on the following deficits Abnormal gait;Impaired flexibility;Decreased mobility;Improper body mechanics;Decreased endurance;Decreased strength;Difficulty walking;Decreased balance   Rehab Potential Good   PT Frequency 2x / week    PT Duration 8 weeks   PT Treatment/Interventions ADLs/Self Care Home Management;Gait training;Therapeutic exercise;Patient/family education;Stair training;Balance training;Neuromuscular re-education;Therapeutic activities   PT Next Visit Plan UE/LE strength and endurance, build gym program.  Gait and balance training.  G-codes needed next session (FOTO score 58% limitation)   Consulted and Agree with Plan of Care Patient        Problem List Patient Active Problem List   Diagnosis Date Noted  . CVA (cerebral infarction) 06/03/2013  . Syncope and collapse 06/03/2013  . Facial laceration 06/03/2013  . Acute bronchitis 06/03/2013  . Hypertension     Bradin Mcadory, PT 12/27/2014, 10:18 AM  [facility] [address], [place], [place], [address] Phone: [phone]   Fax:  [phone]

## 2015-01-01 ENCOUNTER — Ambulatory Visit: Payer: Medicare Other | Attending: Orthopaedic Surgery

## 2015-01-01 DIAGNOSIS — M8580 Other specified disorders of bone density and structure, unspecified site: Secondary | ICD-10-CM | POA: Diagnosis not present

## 2015-01-01 DIAGNOSIS — R269 Unspecified abnormalities of gait and mobility: Secondary | ICD-10-CM | POA: Insufficient documentation

## 2015-01-01 DIAGNOSIS — R531 Weakness: Secondary | ICD-10-CM | POA: Diagnosis present

## 2015-01-01 DIAGNOSIS — Z7409 Other reduced mobility: Secondary | ICD-10-CM

## 2015-01-01 DIAGNOSIS — Z8673 Personal history of transient ischemic attack (TIA), and cerebral infarction without residual deficits: Secondary | ICD-10-CM | POA: Insufficient documentation

## 2015-01-01 DIAGNOSIS — E559 Vitamin D deficiency, unspecified: Secondary | ICD-10-CM | POA: Insufficient documentation

## 2015-01-01 DIAGNOSIS — Z8781 Personal history of (healed) traumatic fracture: Secondary | ICD-10-CM | POA: Diagnosis not present

## 2015-01-01 DIAGNOSIS — I10 Essential (primary) hypertension: Secondary | ICD-10-CM | POA: Insufficient documentation

## 2015-01-01 DIAGNOSIS — M170 Bilateral primary osteoarthritis of knee: Secondary | ICD-10-CM | POA: Insufficient documentation

## 2015-01-01 NOTE — Therapy (Signed)
[facility] [address], [address] [place], [place], [address] Phone: [phone]   Fax:  [phone]  Physical Therapy Treatment  Patient Details  Name: Lance [name] MRN: [id number] Date of Birth: 08/30/1932 Referring Provider:  Perini, Mark, MD  Encounter Date: 01/01/2015      PT End of Session - 01/01/15 0926    Visit Number 10   Number of Visits 20  Medicare   Date for PT Re-Evaluation 01/18/15   PT Start Time 0846   PT Stop Time 0933   PT Time Calculation (min) 47 min   Activity Tolerance Patient tolerated treatment well   Behavior During Therapy [facility] for tasks assessed/performed      Past Medical History  Diagnosis Date  . Osteoarthritis   . Hypogonadism male   . BPH (benign prostatic hypertrophy) with urinary obstruction   . Hypertension   . Osteopenia   . Vitamin D deficiency   . Anxiety   . Depression   . Diverticulosis   . Colon polyps   . Hypercholesterolemia   . Glaucoma   . Sleep apnea   . Alcohol abuse     Remote hx, stopped in 1970  . History of pneumonia     Past Surgical History  Procedure Laterality Date  . Tonsillectomy    . Appendectomy  1945  . Shoulder surgery      rt  . Eye surgery      There were no vitals filed for this visit.  Visit Diagnosis:  Weakness generalized  Decreased strength, endurance, and mobility  Abnormality of gait      Subjective Assessment - 01/01/15 0853    Subjective Rt knee is feeling better- no pain today.  Lt patellar tendon with mild pain that is intermittent.     Currently in Pain? No/denies                       [facility] Adult PT Treatment/Exercise - 01/01/15 0001    Knee/Hip Exercises: Aerobic   Stationary Bike 10min L1Life Cycle seat # 20    Knee/Hip Exercises: Machines for Strengthening   Cybex Leg Press St8 110# B LE 3x10, 55# each leg 3x10   PT assist with putting feet on footplate   Knee/Hip Exercises: Standing   Walking with Sports Cord forward and reverse 20# x 10 each, sidestepping x 5 each  CGA by PT for safety   Other Standing Knee Exercises Trampoline 3 directions x 1min each  b UE support   Shoulder Exercises: Seated   Other Seated Exercises 3way raises 4# 3x10 sitting on silver disc   Shoulder Exercises: ROM/Strengthening   UBE (Upper Arm Bike) Level 1x 6 (3/3) seated                  PT Short Term Goals - 12/24/14 0901    PT SHORT TERM GOAL #1   Title be independent in initial HEP    Time 4   Period Weeks   Status Achieved   PT SHORT TERM GOAL #2   Title improve LE strength to perform sit to stand from regular height chair with moderate UE support   Time 4   Period Weeks   Status Achieved   PT SHORT TERM GOAL #3   Title improve endurance to stand and walk for 15 minutes without need to rest   Time 4   Period Weeks   Status Achieved             PT Long Term Goals - 01/01/15 0857    PT LONG TERM GOAL #1   Title be independent in advanced HEP   Time 8   Period Weeks   Status On-going  Met for current HEP   PT LONG TERM GOAL #2   Title reduce FOTO to < or = to 50% limitation   Period Weeks   Status On-going  58% limitation last session               Plan - 01/01/15 0858    Clinical Impression Statement FOTO score improved to 58% limitation.  Pt without pain in legs today so increased ease with exercise.  See goals for assessment.   Pt will benefit from skilled therapeutic intervention in order to improve on the following deficits Abnormal gait;Impaired flexibility;Decreased mobility;Improper body mechanics;Decreased endurance;Decreased strength;Difficulty walking;Decreased balance   Rehab Potential Good   PT Frequency 2x / week   PT Duration 8 weeks   PT Treatment/Interventions ADLs/Self Care Home Management;Gait training;Therapeutic exercise;Patient/family education;Stair training;Balance training;Neuromuscular re-education;Therapeutic activities    PT Next Visit Plan UE/LE strength and endurance.  Gait and balance training.   Consulted and Agree with Plan of Care Patient          G-Codes - 01/01/15 0852    Functional Assessment Tool Used FOTO: 58% limitation   Functional Limitation Mobility: Walking and moving around   Mobility: Walking and Moving Around Current Status (G8978) At least 40 percent but less than 60 percent impaired, limited or restricted   Mobility: Walking and Moving Around Goal Status (G8979) At least 40 percent but less than 60 percent impaired, limited or restricted      Problem List Patient Active Problem List   Diagnosis Date Noted  . CVA (cerebral infarction) 06/03/2013  . Syncope and collapse 06/03/2013  . Facial laceration 06/03/2013  . Acute bronchitis 06/03/2013  . Hypertension     TAKACS,KELLY, PT 01/01/2015, 9:28 AM  [facility] [address], [address] [place], [place], [address] Phone: [phone]   Fax:  [phone]      

## 2015-01-03 ENCOUNTER — Ambulatory Visit: Payer: Medicare Other

## 2015-01-03 DIAGNOSIS — R531 Weakness: Secondary | ICD-10-CM | POA: Diagnosis not present

## 2015-01-03 DIAGNOSIS — R269 Unspecified abnormalities of gait and mobility: Secondary | ICD-10-CM

## 2015-01-03 DIAGNOSIS — Z7409 Other reduced mobility: Secondary | ICD-10-CM

## 2015-01-03 NOTE — Therapy (Signed)
[facility] [address], [place], [place], [address] Phone: [phone]   Fax:  [phone]  Physical Therapy Treatment  Patient Details  Name: Lance [name] MRN: [id number] Date of Birth: [birth year] Referring Provider:  [name]  Encounter Date: 01/03/2015      PT End of Session - 01/03/15 0915    Visit Number 11   Number of Visits 20  Medicare   Date for PT Re-Evaluation 01/18/15   PT Start Time 0840   PT Stop Time 0922   PT Time Calculation (min) 42 min   Activity Tolerance Patient tolerated treatment well   Behavior During Therapy [facility] for tasks assessed/performed      Past Medical History  Diagnosis Date  . Osteoarthritis   . Hypogonadism male   . BPH (benign prostatic hypertrophy) with urinary obstruction   . Hypertension   . Osteopenia   . Vitamin D deficiency   . Anxiety   . Depression   . Diverticulosis   . Colon polyps   . Hypercholesterolemia   . Glaucoma   . Sleep apnea   . Alcohol abuse     Remote hx, stopped in 1970  . History of pneumonia     Past Surgical History  Procedure Laterality Date  . Tonsillectomy    . Appendectomy  1945  . Shoulder surgery      rt  . Eye surgery      There were no vitals filed for this visit.  Visit Diagnosis:  Weakness generalized  Decreased strength, endurance, and mobility  Abnormality of gait                     [facility] Adult PT Treatment/Exercise - 01/03/15 0001    Knee/Hip Exercises: Aerobic   Stationary Bike 83mn L1Life Cycle seat # 20    Knee/Hip Exercises: Machines for Strengthening   Cybex Leg Press St8 110# B LE 3x10, 55# each leg 3x10   Pt able to put feet on footplate independently today   Knee/Hip Exercises: Standing   Walking with Sports Cord forward and reverse 20# x 10 each, sidestepping x 5 each  CGA by PT for safety   Other Standing Knee Exercises Trampoline 3 directions x 147m each   bil. UE support   Shoulder Exercises: Seated   Other Seated Exercises 3way raises 4# 3x10 sitting on silver disc  this weight continues to be challening to pt   Shoulder Exercises: ROM/Strengthening   UBE (Upper Arm Bike) Level 1x 6 (3/3) seated                  PT Short Term Goals - 12/24/14 0901    PT SHORT TERM GOAL #1   Title be independent in initial HEP    Time 4   Period Weeks   Status Achieved   PT SHORT TERM GOAL #2   Title improve LE strength to perform sit to stand from regular height chair with moderate UE support   Time 4   Period Weeks   Status Achieved   PT SHORT TERM GOAL #3   Title improve endurance to stand and walk for 15 minutes without need to rest   Time 4   Period Weeks   Status Achieved           PT Long Term Goals - 01/01/15 0857    PT LONG TERM GOAL #1   Title be independent in advanced HEP  Time 8   Period Weeks   Status On-going  Met for current HEP   PT LONG TERM GOAL #2   Title reduce FOTO to < or = to 50% limitation   Period Weeks   Status On-going  58% limitation last session               Plan - 01/03/15 0915    Clinical Impression Statement Pt with less guarding required with resisted walking today.     Pt will benefit from skilled therapeutic intervention in order to improve on the following deficits Abnormal gait;Impaired flexibility;Decreased mobility;Improper body mechanics;Decreased endurance;Decreased strength;Difficulty walking;Decreased balance   Rehab Potential Good   PT Frequency 2x / week   PT Duration 8 weeks   PT Treatment/Interventions ADLs/Self Care Home Management;Gait training;Therapeutic exercise;Patient/family education;Stair training;Balance training;Neuromuscular re-education;Therapeutic activities   PT Next Visit Plan UE/LE strength and endurance.  Gait and balance training.        Problem List Patient Active Problem List   Diagnosis Date Noted  . CVA (cerebral infarction)  06/03/2013  . Syncope and collapse 06/03/2013  . Facial laceration 06/03/2013  . Acute bronchitis 06/03/2013  . Hypertension     TAKACS,KELLY, PT 01/03/2015, 9:17 AM  [facility] [address] [place], [place], [address] Phone: [phone]   Fax:  [phone]

## 2015-01-08 ENCOUNTER — Ambulatory Visit: Payer: Medicare Other | Admitting: Physical Therapy

## 2015-01-10 ENCOUNTER — Ambulatory Visit: Payer: Medicare Other

## 2015-01-10 DIAGNOSIS — R531 Weakness: Secondary | ICD-10-CM

## 2015-01-10 DIAGNOSIS — R6889 Other general symptoms and signs: Secondary | ICD-10-CM

## 2015-01-10 DIAGNOSIS — Z7409 Other reduced mobility: Secondary | ICD-10-CM

## 2015-01-10 DIAGNOSIS — R269 Unspecified abnormalities of gait and mobility: Secondary | ICD-10-CM

## 2015-01-10 NOTE — Therapy (Signed)
[facility] [address], [place], [place], [address] Phone: [phone]   Fax:  [phone]  Physical Therapy Treatment  Patient Details  Name: Lance [name] MRN: [id number] Date of Birth: 1932/07/03 Referring Provider:  [name]  Encounter Date: 01/10/2015      PT End of Session - 01/10/15 0910    Visit Number 12   Number of Visits 20  Medicare   Date for PT Re-Evaluation 01/18/15   PT Start Time 0836   PT Stop Time 0917   PT Time Calculation (min) 41 min   Activity Tolerance Patient tolerated treatment well   Behavior During Therapy [facility] for tasks assessed/performed      Past Medical History  Diagnosis Date  . Osteoarthritis   . Hypogonadism male   . BPH (benign prostatic hypertrophy) with urinary obstruction   . Hypertension   . Osteopenia   . Vitamin D deficiency   . Anxiety   . Depression   . Diverticulosis   . Colon polyps   . Hypercholesterolemia   . Glaucoma   . Sleep apnea   . Alcohol abuse     Remote hx, stopped in 1970  . History of pneumonia     Past Surgical History  Procedure Laterality Date  . Tonsillectomy    . Appendectomy  1945  . Shoulder surgery      rt  . Eye surgery      There were no vitals filed for this visit.  Visit Diagnosis:  Weakness generalized  Decreased strength, endurance, and mobility  Abnormality of gait      Subjective Assessment - 01/10/15 0829    Subjective Pt with only 1 session this week due to conflict with appt. earlier this week.  Pt reports minimal compliance with HEP as his son has been visiting.     Currently in Pain? No/denies                       [facility] Adult PT Treatment/Exercise - 01/10/15 0001    Knee/Hip Exercises: Aerobic   Stationary Bike 56min L1Life Cycle seat # 20   PT present to discuss status with pt.   Knee/Hip Exercises: Machines for Strengthening   Cybex Leg Press St8 110# B LE  3x10, 55# each leg 3x10   Pt able to put feet on footplate independently today   Knee/Hip Exercises: Standing   Other Standing Knee Exercises Trampoline 3 directions x 56min each  bil. UE support   Knee/Hip Exercises: Seated   Long Arc Quad Strengthening;Both;2 sets;10 reps;Weights   Long Arc Quad Weight 3 lbs.   Long Arc Quad Limitations limited hamstring flexibility    Shoulder Exercises: ROM/Strengthening   UBE (Upper Arm Bike) Level 1x 6 (3/3) seated                  PT Short Term Goals - 12/24/14 0901    PT SHORT TERM GOAL #1   Title be independent in initial HEP    Time 4   Period Weeks   Status Achieved   PT SHORT TERM GOAL #2   Title improve LE strength to perform sit to stand from regular height chair with moderate UE support   Time 4   Period Weeks   Status Achieved   PT SHORT TERM GOAL #3   Title improve endurance to stand and walk for 15 minutes without need to rest   Time 4  Period Weeks   Status Achieved           PT Long Term Goals - 01/10/15 0843    PT LONG TERM GOAL #1   Title be independent in advanced HEP   Time 8   Period Weeks   Status --  independent in HEP- limited compliance with week   PT LONG TERM GOAL #3   Title improve LE strength to perform sit to stand with minimal to no UE support > 50% of the time from regular height chair   Status Achieved   PT LONG TERM GOAL #5   Title imrpove endurance to tolerate standing and walking fro 20 minutes without need for rest   Time 8   Period Weeks   Status Achieved  Pt denies any limitation for daily activities               Plan - 01/10/15 0845    Clinical Impression Statement Pt with fatigue today and limited compliance with HEP due to son visiting.  See goals for status.  Pt reports 75% overall improvement since the start of care.   Pt will benefit from skilled therapeutic intervention in order to improve on the following deficits Abnormal gait;Impaired flexibility;Decreased  mobility;Improper body mechanics;Decreased endurance;Decreased strength;Difficulty walking;Decreased balance   PT Frequency 2x / week   PT Duration 8 weeks   PT Treatment/Interventions ADLs/Self Care Home Management;Gait training;Therapeutic exercise;Patient/family education;Stair training;Balance training;Neuromuscular re-education;Therapeutic activities   PT Next Visit Plan Renewal needed next session.  UE/LE strength and endurance, gait and balance.  Write goal for negotiating curbs.   Consulted and Agree with Plan of Care Patient        Problem List Patient Active Problem List   Diagnosis Date Noted  . CVA (cerebral infarction) 06/03/2013  . Syncope and collapse 06/03/2013  . Facial laceration 06/03/2013  . Acute bronchitis 06/03/2013  . Hypertension     Jeret Goyer, PT 01/10/2015, 9:11 AM  [facility] [address] [place], [place], [address] Phone: [phone]   Fax:  [phone]

## 2015-01-15 ENCOUNTER — Ambulatory Visit: Payer: Medicare Other

## 2015-01-15 DIAGNOSIS — Z7409 Other reduced mobility: Secondary | ICD-10-CM

## 2015-01-15 DIAGNOSIS — R531 Weakness: Secondary | ICD-10-CM | POA: Diagnosis not present

## 2015-01-15 DIAGNOSIS — R269 Unspecified abnormalities of gait and mobility: Secondary | ICD-10-CM

## 2015-01-15 NOTE — Therapy (Signed)
[facility] [address], [place], [place], [address] Phone: [phone]   Fax:  [phone]  Physical Therapy Treatment  Patient Details  Name: Lance [name] MRN: [id number] Date of Birth: [birth year] Referring Provider:  [name]  Encounter Date: 01/15/2015      PT End of Session - 01/15/15 1009    Visit Number 13   Number of Visits 20  Medicare   Date for PT Re-Evaluation 03/15/15   PT Start Time 0931   PT Stop Time 1015   PT Time Calculation (min) 44 min   Activity Tolerance Patient tolerated treatment well   Behavior During Therapy [facility] for tasks assessed/performed      Past Medical History  Diagnosis Date  . Osteoarthritis   . Hypogonadism male   . BPH (benign prostatic hypertrophy) with urinary obstruction   . Hypertension   . Osteopenia   . Vitamin D deficiency   . Anxiety   . Depression   . Diverticulosis   . Colon polyps   . Hypercholesterolemia   . Glaucoma   . Sleep apnea   . Alcohol abuse     Remote hx, stopped in 1970  . History of pneumonia     Past Surgical History  Procedure Laterality Date  . Tonsillectomy    . Appendectomy  1945  . Shoulder surgery      rt  . Eye surgery      There were no vitals filed for this visit.  Visit Diagnosis:  Weakness generalized - Plan: PT plan of care cert/re-cert  Decreased strength, endurance, and mobility - Plan: PT plan of care cert/re-cert  Abnormality of gait - Plan: PT plan of care cert/re-cert      Subjective Assessment - 01/15/15 0944    Subjective Pt is now wearing compression garments on his legs due to edema.  Pt reports 70% overall improvement since the start of care.     Currently in Pain? Yes   Pain Score 4    Pain Location Knee   Pain Orientation Right;Left   Pain Descriptors / Indicators Sore   Pain Type Chronic pain   Pain Onset More than a month ago   Pain Frequency Intermittent   Aggravating  Factors  steps and inclines   Pain Relieving Factors not walking, not negotiating steps or inclines            [facility] PT Assessment - 01/15/15 0001    Assessment   Medical Diagnosis generalized deconditioning, LE weakness, OA bil knees   Onset Date 05/28/14   Observation/Other Assessments   Focus on Therapeutic Outcomes (FOTO)  58% limitation   Timed Up and Go Test   TUG Normal TUG   Normal TUG (seconds) 11                     [facility] Adult PT Treatment/Exercise - 01/15/15 0001    Knee/Hip Exercises: Aerobic   Stationary Bike 49min L1Life Cycle seat # 20    Knee/Hip Exercises: Standing   Other Standing Knee Exercises Trampoline 3 directions x 67min each  bil. UE support   Knee/Hip Exercises: Seated   Long Arc Quad Strengthening;Both;2 sets;10 reps;Weights   Long Arc Quad Weight 3 lbs.   Long [employer] Limitations limited hamstring flexibility    Shoulder Exercises: Seated   Other Seated Exercises 3way raises 4# 3x10 sitting on silver disc  this weight continues to be challening to pt   Shoulder  Exercises: ROM/Strengthening   UBE (Upper Arm Bike) Level 1x 6 (3/3) seated                  PT Short Term Goals - 12/24/14 0901    PT SHORT TERM GOAL #1   Title be independent in initial HEP    Time 4   Period Weeks   Status Achieved   PT SHORT TERM GOAL #2   Title improve LE strength to perform sit to stand from regular height chair with moderate UE support   Time 4   Period Weeks   Status Achieved   PT SHORT TERM GOAL #3   Title improve endurance to stand and walk for 15 minutes without need to rest   Time 4   Period Weeks   Status Achieved           PT Long Term Goals - 01/15/15 0947    PT LONG TERM GOAL #1   Title be independent in advanced HEP   Time 8   Period Weeks   Status On-going   PT LONG TERM GOAL #2   Title reduce FOTO to < or = to 50% limitation   Time 8   Period Weeks   Status On-going  58% limitation- 2 visits ago   PT  LONG TERM GOAL #3   Title improve LE strength to perform sit to stand with minimal to no UE support > 50% of the time from regular height chair   Status Achieved  60% less use of UEs   PT LONG TERM GOAL #4   Title perform TUG in < or = to 12 seconds   Status Achieved  11 seconds today   PT LONG TERM GOAL #5   Title imrpove endurance to tolerate standing and walking fro 20 minutes without need for rest   Status Achieved  No limitation   Additional Long Term Goals   Additional Long Term Goals Yes   PT LONG TERM GOAL #6   Title ascend and descend a curb with 50% improved ease   Time 8   Period Weeks   Status New               Plan - 01/15/15 0932    Clinical Impression Statement Pt reports 70-80% improvement in endurance and function since the start of care.  Pt with continued endurance, gait and balance deficits.  PT will continue to address and advance strength, gait, and balance   Pt will benefit from skilled therapeutic intervention in order to improve on the following deficits Abnormal gait;Impaired flexibility;Decreased mobility;Improper body mechanics;Decreased endurance;Decreased strength;Difficulty walking;Decreased balance   Rehab Potential Good   PT Frequency 2x / week   PT Duration 8 weeks   PT Treatment/Interventions ADLs/Self Care Home Management;Gait training;Therapeutic exercise;Patient/family education;Stair training;Balance training;Neuromuscular re-education;Therapeutic activities   PT Next Visit Plan UE/LE strength and endurance, practice negotiating curbs, sit to stand.     Consulted and Agree with Plan of Care Patient        Problem List Patient Active Problem List   Diagnosis Date Noted  . CVA (cerebral infarction) 06/03/2013  . Syncope and collapse 06/03/2013  . Facial laceration 06/03/2013  . Acute bronchitis 06/03/2013  . Hypertension     Lymon Kidney, PT 01/15/2015, 10:10 AM  [facility] [address], [place], [place], [address] Phone: [phone]   Fax:  [phone]

## 2015-01-16 ENCOUNTER — Ambulatory Visit: Payer: Medicare Other

## 2015-01-18 ENCOUNTER — Encounter: Payer: Self-pay | Admitting: Physical Therapy

## 2015-01-18 ENCOUNTER — Ambulatory Visit: Payer: Medicare Other | Admitting: Physical Therapy

## 2015-01-18 DIAGNOSIS — R6889 Other general symptoms and signs: Secondary | ICD-10-CM

## 2015-01-18 DIAGNOSIS — R531 Weakness: Secondary | ICD-10-CM

## 2015-01-18 DIAGNOSIS — Z7409 Other reduced mobility: Secondary | ICD-10-CM

## 2015-01-18 DIAGNOSIS — R269 Unspecified abnormalities of gait and mobility: Secondary | ICD-10-CM

## 2015-01-18 NOTE — Therapy (Signed)
[facility] [address] [place], [place], [address] Phone: [phone]   Fax:  [phone]  Physical Therapy Treatment  Patient Details  Name: Lance [name] MRN: [id number] Date of Birth: [birth year] Referring Provider:  [name]  Encounter Date: 01/18/2015      PT End of Session - 01/18/15 1004    Visit Number 14   Number of Visits 20   Date for PT Re-Evaluation 03/15/15   PT Start Time 0926   PT Stop Time 1015   PT Time Calculation (min) 49 min   Activity Tolerance Patient tolerated treatment well   Behavior During Therapy [facility] for tasks assessed/performed      Past Medical History  Diagnosis Date  . Osteoarthritis   . Hypogonadism male   . BPH (benign prostatic hypertrophy) with urinary obstruction   . Hypertension   . Osteopenia   . Vitamin D deficiency   . Anxiety   . Depression   . Diverticulosis   . Colon polyps   . Hypercholesterolemia   . Glaucoma   . Sleep apnea   . Alcohol abuse     Remote hx, stopped in 1970  . History of pneumonia     Past Surgical History  Procedure Laterality Date  . Tonsillectomy    . Appendectomy  1945  . Shoulder surgery      rt  . Eye surgery      There were no vitals filed for this visit.  Visit Diagnosis:  Weakness generalized  Decreased strength, endurance, and mobility  Abnormality of gait      Subjective Assessment - 01/18/15 0931    Subjective Going up the stairs causes discomfort in knees and feet, has difficulties to place on the compression garments   Pertinent History Pt had 5th metatarsal fracture of Rt foot 6 months ago and has had limited mobility during this time leading to weakness and deconditioning.  Pt had dose pack of steroids last week.  Injections into knees (Synovisc?)   Limitations Sitting;Standing;Walking   Currently in Pain? Yes   Pain Score 4    Pain Location Knee   Pain Orientation Left   Pain  Descriptors / Indicators Sore  feeling bone on bone on Lt knee today   Pain Type Chronic pain   Pain Onset More than a month ago                         [facility] Adult PT Treatment/Exercise - 01/18/15 0001    Knee/Hip Exercises: Aerobic   Stationary Bike 23min L1Life Cycle seat # 20    Knee/Hip Exercises: Machines for Strengthening   Cybex Leg Press St8 110# B LE 3x10, 55# with Lt LE only 10 due to pain, 3 x 10 with Rt LE   Knee/Hip Exercises: Standing   Other Standing Knee Exercises Trampoline 3 directions x 73min each   Knee/Hip Exercises: Seated   Long Arc Quad Strengthening;Both;2 sets;10 reps;Weights   Long Arc Quad Weight 3 lbs.   Long Arc Quad Limitations limited hamstring flexibility    Shoulder Exercises: Seated   Other Seated Exercises 3way raises 4# 3x10 sitting on silver disc   Shoulder Exercises: ROM/Strengthening   UBE (Upper Arm Bike) Level 1x 6 (3/3) seated                  PT Short Term Goals - 12/24/14 0901    PT SHORT TERM GOAL #1  Title be independent in initial HEP    Time 4   Period Weeks   Status Achieved   PT SHORT TERM GOAL #2   Title improve LE strength to perform sit to stand from regular height chair with moderate UE support   Time 4   Period Weeks   Status Achieved   PT SHORT TERM GOAL #3   Title improve endurance to stand and walk for 15 minutes without need to rest   Time 4   Period Weeks   Status Achieved           PT Long Term Goals - 01/15/15 0947    PT LONG TERM GOAL #1   Title be independent in advanced HEP   Time 8   Period Weeks   Status On-going   PT LONG TERM GOAL #2   Title reduce FOTO to < or = to 50% limitation   Time 8   Period Weeks   Status On-going  58% limitation- 2 visits ago   PT LONG TERM GOAL #3   Title improve LE strength to perform sit to stand with minimal to no UE support > 50% of the time from regular height chair   Status Achieved  60% less use of UEs   PT LONG TERM GOAL #4    Title perform TUG in < or = to 12 seconds   Status Achieved  11 seconds today   PT LONG TERM GOAL #5   Title imrpove endurance to tolerate standing and walking fro 20 minutes without need for rest   Status Achieved  No limitation   Additional Long Term Goals   Additional Long Term Goals Yes   PT LONG TERM GOAL #6   Title ascend and descend a curb with 50% improved ease   Time 8   Period Weeks   Status New               Plan - 01/18/15 1005    Clinical Impression Statement Pt will continue to benefit from skilled PT to improve endurance and functional activities. Pt. with continued endurance, gait and balance deficits.   Pt will benefit from skilled therapeutic intervention in order to improve on the following deficits Abnormal gait;Impaired flexibility;Decreased mobility;Improper body mechanics;Decreased endurance;Decreased strength;Difficulty walking;Decreased balance   Rehab Potential Good   PT Frequency 2x / week   PT Duration 8 weeks   PT Next Visit Plan UE/LE strength and endurance, practice negotiating curbs, sit to stand.     Consulted and Agree with Plan of Care Patient        Problem List Patient Active Problem List   Diagnosis Date Noted  . CVA (cerebral infarction) 06/03/2013  . Syncope and collapse 06/03/2013  . Facial laceration 06/03/2013  . Acute bronchitis 06/03/2013  . Hypertension     NAUMANN-HOUEGNIFIO,Nashley Cordoba PTA 01/18/2015, 10:15 AM  [facility] [address], [place], [place], [address] Phone: [phone]   Fax:  [phone]

## 2015-01-23 ENCOUNTER — Encounter: Payer: Self-pay | Admitting: Physical Therapy

## 2015-01-23 ENCOUNTER — Ambulatory Visit: Payer: Medicare Other | Admitting: Physical Therapy

## 2015-01-23 DIAGNOSIS — R531 Weakness: Secondary | ICD-10-CM

## 2015-01-23 DIAGNOSIS — Z7409 Other reduced mobility: Secondary | ICD-10-CM

## 2015-01-23 DIAGNOSIS — R6889 Other general symptoms and signs: Secondary | ICD-10-CM

## 2015-01-23 DIAGNOSIS — R269 Unspecified abnormalities of gait and mobility: Secondary | ICD-10-CM

## 2015-01-23 NOTE — Therapy (Signed)
[facility] [address], [place], [place], [address] Phone: [phone]   Fax:  [phone]  Physical Therapy Treatment  Patient Details  Name: Lance [name] MRN: [id number] Date of Birth: January 05, 1932 Referring Provider:  Crist Infante, MD  Encounter Date: 01/23/2015      PT End of Session - 01/23/15 1502    Visit Number 15   Number of Visits 20   Date for PT Re-Evaluation 03/15/15   PT Start Time 1017   PT Stop Time 1530   PT Time Calculation (min) 46 min   Activity Tolerance Patient tolerated treatment well   Behavior During Therapy [facility] for tasks assessed/performed      Past Medical History  Diagnosis Date  . Osteoarthritis   . Hypogonadism male   . BPH (benign prostatic hypertrophy) with urinary obstruction   . Hypertension   . Osteopenia   . Vitamin D deficiency   . Anxiety   . Depression   . Diverticulosis   . Colon polyps   . Hypercholesterolemia   . Glaucoma   . Sleep apnea   . Alcohol abuse     Remote hx, stopped in 1970  . History of pneumonia     Past Surgical History  Procedure Laterality Date  . Tonsillectomy    . Appendectomy  1945  . Shoulder surgery      rt  . Eye surgery      There were no vitals filed for this visit.  Visit Diagnosis:  Weakness generalized  Decreased strength, endurance, and mobility  Abnormality of gait                       [facility] Adult PT Treatment/Exercise - 01/23/15 0001    Ambulation/Gait   Curb 6: Modified independent (Device/increase time);Other (comment)  practiced proper technique ascendingdescending,son Shanon Brow pre   Knee/Hip Exercises: Aerobic   Stationary Bike 42min L1Life Cycle seat # 20    Knee/Hip Exercises: Machines for Strengthening   Cybex Leg Press St8 110# B LE 3x10, 45# with Lt and Rt  LE 3x10   Knee/Hip Exercises: Standing   Other Standing Knee Exercises Trampoline 3 directions x 38min each   Knee/Hip  Exercises: Seated   Long Arc Quad Strengthening;Both;2 sets;10 reps;Weights   Long Arc Quad Weight 3 lbs.   Long Arc Quad Limitations limited hamstring flexibility    Shoulder Exercises: ROM/Strengthening   UBE (Upper Arm Bike) Level 4 x 6 (3/3) seated                  PT Short Term Goals - 12/24/14 0901    PT SHORT TERM GOAL #1   Title be independent in initial HEP    Time 4   Period Weeks   Status Achieved   PT SHORT TERM GOAL #2   Title improve LE strength to perform sit to stand from regular height chair with moderate UE support   Time 4   Period Weeks   Status Achieved   PT SHORT TERM GOAL #3   Title improve endurance to stand and walk for 15 minutes without need to rest   Time 4   Period Weeks   Status Achieved           PT Long Term Goals - 01/23/15 1506    PT LONG TERM GOAL #1   Title be independent in advanced HEP   Time 8   Period Weeks   Status On-going  PT LONG TERM GOAL #2   Title reduce FOTO to < or = to 50% limitation   Time 8   Period Weeks   Status On-going   PT LONG TERM GOAL #3   Title improve LE strength to perform sit to stand with minimal to no UE support > 50% of the time from regular height chair   Time 8   Period Weeks   Status Achieved   PT LONG TERM GOAL #4   Title perform TUG in < or = to 12 seconds   Time 8   Period Weeks   Status Achieved   PT LONG TERM GOAL #5   Title imrpove endurance to tolerate standing and walking fro 20 minutes without need for rest   Time 8   Period Weeks   Status Achieved   PT LONG TERM GOAL #6   Title ascend and descend a curb with 50% improved ease   Time 8   Period Weeks   Status On-going               Plan - 01/23/15 1503    Clinical Impression Statement Pt will continue to benefit from skilled PT to improve endurance and functional activities.    Pt will benefit from skilled therapeutic intervention in order to improve on the following deficits Abnormal gait;Impaired  flexibility;Decreased mobility;Improper body mechanics;Decreased endurance;Decreased strength;Difficulty walking;Decreased balance   Rehab Potential Good   PT Frequency 2x / week   PT Duration 8 weeks   PT Treatment/Interventions ADLs/Self Care Home Management;Gait training;Therapeutic exercise;Patient/family education;Stair training;Balance training;Neuromuscular re-education;Therapeutic activities   PT Next Visit Plan UE/LE strength and endurance, practice negotiating curbs, sit to stand.     Consulted and Agree with Plan of Care Patient        Problem List Patient Active Problem List   Diagnosis Date Noted  . CVA (cerebral infarction) 06/03/2013  . Syncope and collapse 06/03/2013  . Facial laceration 06/03/2013  . Acute bronchitis 06/03/2013  . Hypertension     NAUMANN-HOUEGNIFIO,Jerusha Reising PTA 01/23/2015, 3:34 PM  [facility] [address] [place], [place], [address] Phone: [phone]   Fax:  [phone]

## 2015-01-29 ENCOUNTER — Encounter: Payer: Medicare Other | Admitting: Physical Therapy

## 2015-01-31 ENCOUNTER — Ambulatory Visit: Payer: Medicare Other | Attending: Orthopaedic Surgery

## 2015-01-31 ENCOUNTER — Telehealth: Payer: Self-pay

## 2015-01-31 DIAGNOSIS — Z8673 Personal history of transient ischemic attack (TIA), and cerebral infarction without residual deficits: Secondary | ICD-10-CM | POA: Insufficient documentation

## 2015-01-31 DIAGNOSIS — M8580 Other specified disorders of bone density and structure, unspecified site: Secondary | ICD-10-CM | POA: Insufficient documentation

## 2015-01-31 DIAGNOSIS — E559 Vitamin D deficiency, unspecified: Secondary | ICD-10-CM | POA: Insufficient documentation

## 2015-01-31 DIAGNOSIS — Z8781 Personal history of (healed) traumatic fracture: Secondary | ICD-10-CM | POA: Insufficient documentation

## 2015-01-31 DIAGNOSIS — I10 Essential (primary) hypertension: Secondary | ICD-10-CM | POA: Insufficient documentation

## 2015-01-31 DIAGNOSIS — R531 Weakness: Secondary | ICD-10-CM | POA: Insufficient documentation

## 2015-01-31 DIAGNOSIS — M170 Bilateral primary osteoarthritis of knee: Secondary | ICD-10-CM | POA: Insufficient documentation

## 2015-01-31 DIAGNOSIS — R269 Unspecified abnormalities of gait and mobility: Secondary | ICD-10-CM | POA: Insufficient documentation

## 2015-01-31 NOTE — Telephone Encounter (Signed)
PT called and spoke with patient's wife.  She was not aware that he had appointment today [place]  She confirmed the next appointment.

## 2015-02-05 ENCOUNTER — Encounter: Payer: Self-pay | Admitting: Physical Therapy

## 2015-02-05 ENCOUNTER — Ambulatory Visit: Payer: Medicare Other | Admitting: Physical Therapy

## 2015-02-05 DIAGNOSIS — R531 Weakness: Secondary | ICD-10-CM | POA: Diagnosis present

## 2015-02-05 DIAGNOSIS — E559 Vitamin D deficiency, unspecified: Secondary | ICD-10-CM | POA: Diagnosis not present

## 2015-02-05 DIAGNOSIS — Z7409 Other reduced mobility: Secondary | ICD-10-CM

## 2015-02-05 DIAGNOSIS — I10 Essential (primary) hypertension: Secondary | ICD-10-CM | POA: Diagnosis not present

## 2015-02-05 DIAGNOSIS — R6889 Other general symptoms and signs: Secondary | ICD-10-CM

## 2015-02-05 DIAGNOSIS — Z8673 Personal history of transient ischemic attack (TIA), and cerebral infarction without residual deficits: Secondary | ICD-10-CM | POA: Diagnosis not present

## 2015-02-05 DIAGNOSIS — R269 Unspecified abnormalities of gait and mobility: Secondary | ICD-10-CM

## 2015-02-05 DIAGNOSIS — M170 Bilateral primary osteoarthritis of knee: Secondary | ICD-10-CM | POA: Diagnosis not present

## 2015-02-05 DIAGNOSIS — Z8781 Personal history of (healed) traumatic fracture: Secondary | ICD-10-CM | POA: Diagnosis not present

## 2015-02-05 DIAGNOSIS — M8580 Other specified disorders of bone density and structure, unspecified site: Secondary | ICD-10-CM | POA: Diagnosis not present

## 2015-02-05 NOTE — Therapy (Signed)
[facility] [address], [place], [place], [address] Phone: [phone]   Fax:  [phone]  Physical Therapy Treatment  Patient Details  Name: Lance [name] MRN: [id number] Date of Birth: 1931/11/18 Referring Provider:  Crist Infante, MD  Encounter Date: 02/05/2015      PT End of Session - 02/05/15 1012    Visit Number 16   Number of Visits 20   Date for PT Re-Evaluation 03/15/15   PT Start Time 0928   PT Stop Time 1015   PT Time Calculation (min) 47 min   Activity Tolerance Patient tolerated treatment well   Behavior During Therapy [facility] for tasks assessed/performed      Past Medical History  Diagnosis Date  . Osteoarthritis   . Hypogonadism male   . BPH (benign prostatic hypertrophy) with urinary obstruction   . Hypertension   . Osteopenia   . Vitamin D deficiency   . Anxiety   . Depression   . Diverticulosis   . Colon polyps   . Hypercholesterolemia   . Glaucoma   . Sleep apnea   . Alcohol abuse     Remote hx, stopped in 1970  . History of pneumonia     Past Surgical History  Procedure Laterality Date  . Tonsillectomy    . Appendectomy  1945  . Shoulder surgery      rt  . Eye surgery      There were no vitals filed for this visit.  Visit Diagnosis:  Weakness generalized  Decreased strength, endurance, and mobility  Abnormality of gait      Subjective Assessment - 02/05/15 1000    Subjective Going up the stairs causes discomfort in knees and feet, has difficulties to place on the compression garments   Pertinent History Pt had 5th metatarsal fracture of Rt foot 6 months ago and has had limited mobility during this time leading to weakness and deconditioning.  Pt had dose pack of steroids last week.  Injections into knees (Synovisc?)   Currently in Pain? Yes   Pain Score 4    Pain Location Knee   Pain Orientation Left   Pain Descriptors / Indicators Sore   Pain Type Chronic  pain   Pain Onset More than a month ago   Pain Frequency Intermittent   Multiple Pain Sites No                         [facility] Adult PT Treatment/Exercise - 02/05/15 0001    Knee/Hip Exercises: Aerobic   Stationary Bike 71min L1Life Cycle seat # 20    Knee/Hip Exercises: Machines for Strengthening   Cybex Leg Press St8 110# B LE 3x10, 45# with Lt and Rt  LE 3x10  may incr. weight on single leg next visit   Knee/Hip Exercises: Standing   Forward Step Up Right  2x10, 2 x 5 on left limited reps due to incr pain in Lt knee   Other Standing Knee Exercises Trampoline 3 directions x 63min each   Other Standing Knee Exercises Sit to stand x14reps in 1 min   Knee/Hip Exercises: Seated   Long Arc Quad Strengthening;Both;2 sets;10 reps;Weights   Long Arc Quad Weight 3 lbs.   Long [employer] Quad Limitations limited hamstring flexibility    Shoulder Exercises: ROM/Strengthening   UBE (Upper Arm Bike) Level 4 x 6 (3/3) seated  PT Short Term Goals - 02/05/15 1014    PT SHORT TERM GOAL #1   Title be independent in initial HEP    Time 4   Period Weeks   Status Achieved   PT SHORT TERM GOAL #2   Title improve LE strength to perform sit to stand from regular height chair with moderate UE support   Time 4   Period Weeks   Status Achieved   PT SHORT TERM GOAL #3   Title improve endurance to stand and walk for 15 minutes without need to rest   Time 4   Period Weeks   Status Achieved           PT Long Term Goals - 02/05/15 1015    PT LONG TERM GOAL #1   Title be independent in advanced HEP   Time 8   Period Weeks   Status On-going   PT LONG TERM GOAL #2   Title reduce FOTO to < or = to 50% limitation   Time 8   Period Weeks   Status On-going   PT LONG TERM GOAL #3   Title improve LE strength to perform sit to stand with minimal to no UE support > 50% of the time from regular height chair   Time 8   Period Weeks   Status Achieved   PT LONG  TERM GOAL #4   Title perform TUG in < or = to 12 seconds   Time 8   Period Weeks   Status Achieved   PT LONG TERM GOAL #5   Title imrpove endurance to tolerate standing and walking fro 20 minutes without need for rest   Time 8   Period Weeks   Status Achieved   PT LONG TERM GOAL #6   Title ascend and descend a curb with 50% improved ease   Time 8   Period Weeks   Status On-going               Plan - 02/05/15 1013    Clinical Impression Statement Pt will continue to benefit from skilled PT to improve endurance and functional activities   Pt will benefit from skilled therapeutic intervention in order to improve on the following deficits Abnormal gait;Impaired flexibility;Decreased mobility;Improper body mechanics;Decreased endurance;Decreased strength;Difficulty walking;Decreased balance   Rehab Potential Good   PT Frequency 2x / week   PT Duration 8 weeks   PT Treatment/Interventions ADLs/Self Care Home Management;Gait training;Therapeutic exercise;Patient/family education;Stair training;Balance training;Neuromuscular re-education;Therapeutic activities   PT Next Visit Plan UE/LE strength and endurance, practice sit to stand for strength and endurance   Consulted and Agree with Plan of Care Patient        Problem List Patient Active Problem List   Diagnosis Date Noted  . CVA (cerebral infarction) 06/03/2013  . Syncope and collapse 06/03/2013  . Facial laceration 06/03/2013  . Acute bronchitis 06/03/2013  . Hypertension     NAUMANN-HOUEGNIFIO,Rashon Westrup PTA 02/05/2015, 10:17 AM  [facility] [address] [place], [place], [address] Phone: [phone]   Fax:  [phone]

## 2015-02-07 ENCOUNTER — Encounter: Payer: Self-pay | Admitting: Physical Therapy

## 2015-02-07 ENCOUNTER — Ambulatory Visit: Payer: Medicare Other | Admitting: Physical Therapy

## 2015-02-07 DIAGNOSIS — R531 Weakness: Secondary | ICD-10-CM

## 2015-02-07 DIAGNOSIS — R269 Unspecified abnormalities of gait and mobility: Secondary | ICD-10-CM

## 2015-02-07 DIAGNOSIS — Z7409 Other reduced mobility: Secondary | ICD-10-CM

## 2015-02-07 NOTE — Therapy (Signed)
[facility] [address] [place], [place], [address] Phone: [phone]   Fax:  [phone]  Physical Therapy Treatment  Patient Details  Name: Lance [name] MRN: [id number] Date of Birth: 01-30-1932 Referring Provider:  Crist Infante, MD  Encounter Date: 02/07/2015      PT End of Session - 02/07/15 0956    Visit Number 17   Number of Visits 20   Date for PT Re-Evaluation 03/15/15   Authorization Type `   PT Start Time 0932   PT Stop Time 1018   PT Time Calculation (min) 46 min   Activity Tolerance Patient tolerated treatment well   Behavior During Therapy [facility] for tasks assessed/performed      Past Medical History  Diagnosis Date  . Osteoarthritis   . Hypogonadism male   . BPH (benign prostatic hypertrophy) with urinary obstruction   . Hypertension   . Osteopenia   . Vitamin D deficiency   . Anxiety   . Depression   . Diverticulosis   . Colon polyps   . Hypercholesterolemia   . Glaucoma   . Sleep apnea   . Alcohol abuse     Remote hx, stopped in 1970  . History of pneumonia     Past Surgical History  Procedure Laterality Date  . Tonsillectomy    . Appendectomy  1945  . Shoulder surgery      rt  . Eye surgery      There were no vitals filed for this visit.  Visit Diagnosis:  Weakness generalized  Decreased strength, endurance, and mobility  Abnormality of gait      Subjective Assessment - 02/07/15 0948    Subjective Going up the stairs causes discomfort in knees and feet, has difficulties to place on the compression garments   Pertinent History Pt had 5th metatarsal fracture of Rt foot 6 months ago and has had limited mobility during this time leading to weakness and deconditioning.  Pt had dose pack of steroids last week.  Injections into knees (Synovisc?)   Currently in Pain? Yes   Pain Score 6    Pain Location Knee   Pain Orientation Right;Left   Pain Descriptors /  Indicators Sore   Pain Type Chronic pain   Pain Onset More than a month ago   Pain Frequency Intermittent   Multiple Pain Sites No                         [facility] Adult PT Treatment/Exercise - 02/07/15 0001    Knee/Hip Exercises: Aerobic   Stationary Bike 24min L1Life Cycle seat # 20    Knee/Hip Exercises: Machines for Strengthening   Cybex Leg Press St8 110# B LE 3x10, 45# with Lt and Rt  LE 3x10   Knee/Hip Exercises: Standing   Forward Step Up Right;2 sets;10 reps;Hand Hold: 2  attempted step up with Lt LE, but unable to perform   Other Standing Knee Exercises Trampoline 3 directions x 36min each   Knee/Hip Exercises: Seated   Long Arc Quad Strengthening;Both;2 sets;10 reps;Weights   Long Arc Quad Weight 3 lbs.   Long Arc Quad Limitations limited hamstring flexibility    Shoulder Exercises: ROM/Strengthening   UBE (Upper Arm Bike) Level 4 x 6 (3/3) seated                  PT Short Term Goals - 02/05/15 1014    PT SHORT TERM GOAL #1  Title be independent in initial HEP    Time 4   Period Weeks   Status Achieved   PT SHORT TERM GOAL #2   Title improve LE strength to perform sit to stand from regular height chair with moderate UE support   Time 4   Period Weeks   Status Achieved   PT SHORT TERM GOAL #3   Title improve endurance to stand and walk for 15 minutes without need to rest   Time 4   Period Weeks   Status Achieved           PT Long Term Goals - 02/05/15 1015    PT LONG TERM GOAL #1   Title be independent in advanced HEP   Time 8   Period Weeks   Status On-going   PT LONG TERM GOAL #2   Title reduce FOTO to < or = to 50% limitation   Time 8   Period Weeks   Status On-going   PT LONG TERM GOAL #3   Title improve LE strength to perform sit to stand with minimal to no UE support > 50% of the time from regular height chair   Time 8   Period Weeks   Status Achieved   PT LONG TERM GOAL #4   Title perform TUG in < or = to 12  seconds   Time 8   Period Weeks   Status Achieved   PT LONG TERM GOAL #5   Title imrpove endurance to tolerate standing and walking fro 20 minutes without need for rest   Time 8   Period Weeks   Status Achieved   PT LONG TERM GOAL #6   Title ascend and descend a curb with 50% improved ease   Time 8   Period Weeks   Status On-going               Plan - 02/07/15 6256    Clinical Impression Statement Pt with increased pain in bil knees today but able to complete all task   Pt will benefit from skilled therapeutic intervention in order to improve on the following deficits Abnormal gait;Impaired flexibility;Decreased mobility;Improper body mechanics;Decreased endurance;Decreased strength;Difficulty walking;Decreased balance   Rehab Potential Good   PT Frequency 2x / week   PT Duration 8 weeks   PT Treatment/Interventions ADLs/Self Care Home Management;Gait training;Therapeutic exercise;Patient/family education;Stair training;Balance training;Neuromuscular re-education;Therapeutic activities   PT Next Visit Plan UE/LE strength and endurance, practice sit to stand for strength and endurance   Consulted and Agree with Plan of Care Patient        Problem List Patient Active Problem List   Diagnosis Date Noted  . CVA (cerebral infarction) 06/03/2013  . Syncope and collapse 06/03/2013  . Facial laceration 06/03/2013  . Acute bronchitis 06/03/2013  . Hypertension     NAUMANN-HOUEGNIFIO,Mohab Ashby PTA 02/07/2015, 10:21 AM  [facility] [address] [place], [place], [address] Phone: [phone]   Fax:  [phone]

## 2015-02-12 ENCOUNTER — Ambulatory Visit: Payer: Medicare Other | Admitting: Physical Therapy

## 2015-02-12 ENCOUNTER — Encounter: Payer: Self-pay | Admitting: Physical Therapy

## 2015-02-12 DIAGNOSIS — R531 Weakness: Secondary | ICD-10-CM | POA: Diagnosis not present

## 2015-02-12 DIAGNOSIS — Z7409 Other reduced mobility: Secondary | ICD-10-CM

## 2015-02-12 DIAGNOSIS — R269 Unspecified abnormalities of gait and mobility: Secondary | ICD-10-CM

## 2015-02-12 NOTE — Therapy (Signed)
[facility] [address] [place], [place], [address] Phone: [phone]   Fax:  [phone]  Physical Therapy Treatment  Patient Details  Name: Lance [name] MRN: [id number] Date of Birth: 1932-04-29 Referring Provider:  Crist Infante, MD  Encounter Date: 02/12/2015      PT End of Session - 02/12/15 1014    Visit Number 18   Number of Visits 20   Date for PT Re-Evaluation 03/15/15   PT Start Time 0933   PT Stop Time 1018   PT Time Calculation (min) 45 min   Activity Tolerance Patient tolerated treatment well   Behavior During Therapy [facility] for tasks assessed/performed      Past Medical History  Diagnosis Date  . Osteoarthritis   . Hypogonadism male   . BPH (benign prostatic hypertrophy) with urinary obstruction   . Hypertension   . Osteopenia   . Vitamin D deficiency   . Anxiety   . Depression   . Diverticulosis   . Colon polyps   . Hypercholesterolemia   . Glaucoma   . Sleep apnea   . Alcohol abuse     Remote hx, stopped in 1970  . History of pneumonia     Past Surgical History  Procedure Laterality Date  . Tonsillectomy    . Appendectomy  1945  . Shoulder surgery      rt  . Eye surgery      There were no vitals filed for this visit.  Visit Diagnosis:  Weakness generalized  Decreased strength, endurance, and mobility  Abnormality of gait      Subjective Assessment - 02/12/15 0956    Subjective Pt walked into PT gym without SPC, he left it with his wife in waiting area that shows more conficende with ambulation   Currently in Pain? Yes   Pain Score 4    Pain Location Knee   Pain Orientation Left   Pain Descriptors / Indicators Sore;Sharp   Pain Type Chronic pain   Pain Onset More than a month ago   Pain Frequency Intermittent   Multiple Pain Sites No                         [facility] Adult PT Treatment/Exercise - 02/12/15 0001    Knee/Hip Exercises: Aerobic    Stationary Bike 24min L1Life Cycle seat # 20    Knee/Hip Exercises: Machines for Strengthening   Cybex Leg Press St8 115# B LE 3x10, 50# with Lt and Rt  LE 3x10  pt tolerated incr. in weight well   Knee/Hip Exercises: Standing   Forward Step Up Right;10 reps;Hand Hold: 2;3 sets;Other (comment)  able to perform with left x 4, & x 3 limited by pain with we   Other Standing Knee Exercises Trampoline 3 directions x 50min each   Knee/Hip Exercises: Seated   Long Arc Quad Strengthening;Both;2 sets;10 reps;Weights   Long Arc Quad Weight 3 lbs.   Long Arc Quad Limitations limited hamstring flexibility    Shoulder Exercises: ROM/Strengthening   UBE (Upper Arm Bike) Level 4 x 6 (4/4) seated                  PT Short Term Goals - 02/05/15 1014    PT SHORT TERM GOAL #1   Title be independent in initial HEP    Time 4   Period Weeks   Status Achieved   PT SHORT TERM GOAL #2   Title  improve LE strength to perform sit to stand from regular height chair with moderate UE support   Time 4   Period Weeks   Status Achieved   PT SHORT TERM GOAL #3   Title improve endurance to stand and walk for 15 minutes without need to rest   Time 4   Period Weeks   Status Achieved           PT Long Term Goals - 02/12/15 1009    PT LONG TERM GOAL #1   Title be independent in advanced HEP   Time 8   Period Weeks   Status On-going   PT LONG TERM GOAL #2   Title reduce FOTO to < or = to 50% limitation   Time 8   Period Weeks   Status On-going   PT LONG TERM GOAL #3   Title improve LE strength to perform sit to stand with minimal to no UE support > 50% of the time from regular height chair   Time 8   Period Weeks   Status Achieved   PT LONG TERM GOAL #4   Title perform TUG in < or = to 12 seconds   Time 8   Period Weeks   Status Achieved   PT LONG TERM GOAL #5   Title imrpove endurance to tolerate standing and walking fro 20 minutes without need for rest   Time 8   Period Weeks    Status Achieved   PT LONG TERM GOAL #6   Title ascend and descend a curb with 50% improved ease   Time 8   Period Weeks   Status On-going               Plan - 02/12/15 1015    Clinical Impression Statement Pt feeling better today with bil knees, able to incr weight on leg press, and ambulates all distance needed in gym with out SPC   Pt will benefit from skilled therapeutic intervention in order to improve on the following deficits Abnormal gait;Impaired flexibility;Decreased mobility;Improper body mechanics;Decreased endurance;Decreased strength;Difficulty walking;Decreased balance   Rehab Potential Good   PT Frequency 2x / week   PT Duration 8 weeks   PT Treatment/Interventions ADLs/Self Care Home Management;Gait training;Therapeutic exercise;Patient/family education;Stair training;Balance training;Neuromuscular re-education;Therapeutic activities   Consulted and Agree with Plan of Care Patient        Problem List Patient Active Problem List   Diagnosis Date Noted  . CVA (cerebral infarction) 06/03/2013  . Syncope and collapse 06/03/2013  . Facial laceration 06/03/2013  . Acute bronchitis 06/03/2013  . Hypertension     NAUMANN-HOUEGNIFIO,Jaquin Coy PTA 02/12/2015, 10:16 AM  [facility] [address], [place], [place], [address] Phone: [phone]   Fax:  [phone]

## 2015-02-14 ENCOUNTER — Ambulatory Visit: Payer: Medicare Other

## 2015-02-14 DIAGNOSIS — R531 Weakness: Secondary | ICD-10-CM

## 2015-02-14 DIAGNOSIS — R269 Unspecified abnormalities of gait and mobility: Secondary | ICD-10-CM

## 2015-02-14 DIAGNOSIS — Z7409 Other reduced mobility: Secondary | ICD-10-CM

## 2015-02-14 NOTE — Therapy (Addendum)
[facility] [address], [place], [place], [address] Phone: [phone]   Fax:  [phone]  Physical Therapy Treatment  Patient Details  Name: Lance [name] MRN: [id number] Date of Birth: [birth year] Referring Provider:  Crist Infante, MD  Encounter Date: 02/14/2015      PT End of Session - 02/14/15 1020    Visit Number 19   Number of Visits 20   Date for PT Re-Evaluation 03/15/15   PT Start Time 0925   PT Stop Time 1015   PT Time Calculation (min) 50 min   Activity Tolerance Patient tolerated treatment well   Behavior During Therapy [facility] for tasks assessed/performed      Past Medical History  Diagnosis Date  . Osteoarthritis   . Hypogonadism male   . BPH (benign prostatic hypertrophy) with urinary obstruction   . Hypertension   . Osteopenia   . Vitamin D deficiency   . Anxiety   . Depression   . Diverticulosis   . Colon polyps   . Hypercholesterolemia   . Glaucoma   . Sleep apnea   . Alcohol abuse     Remote hx, stopped in 1970  . History of pneumonia     Past Surgical History  Procedure Laterality Date  . Tonsillectomy    . Appendectomy  1945  . Shoulder surgery      rt  . Eye surgery      There were no vitals filed for this visit.  Visit Diagnosis:  Weakness generalized  Decreased strength, endurance, and mobility  Abnormality of gait      Subjective Assessment - 02/14/15 0934    Subjective Pt notices that knee pain has continued and is considering TKA. Has an injection on 6/1.    How long can you stand comfortably? 5 minutes    How long can you walk comfortably? Pt can walk about 10-15 minutes before noticing pain onset    Currently in Pain? Yes   Pain Score 4    Pain Location Knee   Pain Orientation Right;Left   Pain Descriptors / Indicators Sharp   Pain Type Chronic pain   Pain Onset More than a month ago   Pain Frequency Intermittent   Aggravating Factors  Stairs,  walking    Pain Relieving Factors Rest                         [facility] Adult PT Treatment/Exercise - 02/14/15 0001    Knee/Hip Exercises: Aerobic   Stationary Bike 31min L1Life Cycle seat # 20    Knee/Hip Exercises: Machines for Strengthening   Cybex Leg Press St8 115# B LE 3x10, 50# with Lt and Rt  LE 3x10  Increased pain with L knee   Knee/Hip Exercises: Standing   Heel Raises 3 sets;10 reps  Two hand hold on treadmill   Forward Step Up Right;10 reps;Hand Hold: 2;3 sets;Other (comment)  unable to perform Lt step up on 4 in step due to pain   Other Standing Knee Exercises Mini trampoline, single leg balance 3x for 30 seconds  One hand hold    Other Standing Knee Exercises Sit to stand  at treatment table   Shoulder Exercises: ROM/Strengthening   UBE (Upper Arm Bike) Level 1 x 6 (4/4) seated                  PT Short Term Goals - 02/05/15 1014    PT SHORT TERM  GOAL #1   Title be independent in initial HEP    Time 4   Period Weeks   Status Achieved   PT SHORT TERM GOAL #2   Title improve LE strength to perform sit to stand from regular height chair with moderate UE support   Time 4   Period Weeks   Status Achieved   PT SHORT TERM GOAL #3   Title improve endurance to stand and walk for 15 minutes without need to rest   Time 4   Period Weeks   Status Achieved           PT Long Term Goals - 02/12/15 1009    PT LONG TERM GOAL #1   Title be independent in advanced HEP   Time 8   Period Weeks   Status On-going   PT LONG TERM GOAL #2   Title reduce FOTO to < or = to 50% limitation   Time 8   Period Weeks   Status On-going   PT LONG TERM GOAL #3   Title improve LE strength to perform sit to stand with minimal to no UE support > 50% of the time from regular height chair   Time 8   Period Weeks   Status Achieved   PT LONG TERM GOAL #4   Title perform TUG in < or = to 12 seconds   Time 8   Period Weeks   Status Achieved   PT LONG TERM  GOAL #5   Title imrpove endurance to tolerate standing and walking fro 20 minutes without need for rest   Time 8   Period Weeks   Status Achieved   PT LONG TERM GOAL #6   Title ascend and descend a curb with 50% improved ease   Time 8   Period Weeks   Status On-going               Plan - 02/14/15 1026    Clinical Impression Statement Pt has noticed increased pain at Lt knee with exercises and ambulation. Pt report increased difficulty with sit to stand from a low chair; pt able to complete 3x10 sit to stand from raised treatment table with rest breaks between sets. Tolerated extra strengthening/balance exercises well.    Pt will benefit from skilled therapeutic intervention in order to improve on the following deficits Abnormal gait;Impaired flexibility;Decreased mobility;Improper body mechanics;Decreased endurance;Decreased strength;Difficulty walking;Decreased balance   Rehab Potential Good   PT Frequency 2x / week   PT Duration 8 weeks   PT Treatment/Interventions ADLs/Self Care Home Management;Gait training;Therapeutic exercise;Patient/family education;Stair training;Balance training;Neuromuscular re-education;Therapeutic activities   PT Next Visit Plan G codes at the next session; discuss gym/home exercise program   Consulted and Agree with Plan of Care Patient        Problem List Patient Active Problem List   Diagnosis Date Noted  . CVA (cerebral infarction) 06/03/2013  . Syncope and collapse 06/03/2013  . Facial laceration 06/03/2013  . Acute bronchitis 06/03/2013  . Hypertension    Reginal Lutes, SPT 02/14/2015 10:47 AM Sigurd Sos, PT 02/14/2015 10:47 AM  [facility] [address] [place], [place], [address] Phone: [phone]   Fax:  [phone]

## 2015-02-19 ENCOUNTER — Ambulatory Visit: Payer: Medicare Other | Admitting: Physical Therapy

## 2015-02-19 ENCOUNTER — Encounter: Payer: Self-pay | Admitting: Physical Therapy

## 2015-02-19 DIAGNOSIS — R531 Weakness: Secondary | ICD-10-CM | POA: Diagnosis not present

## 2015-02-19 DIAGNOSIS — R269 Unspecified abnormalities of gait and mobility: Secondary | ICD-10-CM

## 2015-02-19 DIAGNOSIS — Z7409 Other reduced mobility: Secondary | ICD-10-CM

## 2015-02-19 NOTE — Therapy (Signed)
[facility] [address] [place], [place], [address] Phone: [phone]   Fax:  [phone]  Physical Therapy Treatment  Patient Details  Name: Lance [name] MRN: [id number] Date of Birth: May 05, 1932 Referring Provider:  Crist Infante, MD  Encounter Date: 02/19/2015      PT End of Session - 02/19/15 1014    Visit Number 20   Number of Visits 20   Date for PT Re-Evaluation 03/15/15   PT Start Time 0931   PT Stop Time 1016   PT Time Calculation (min) 45 min   Activity Tolerance Patient limited by pain  patient with pain with leg press today   Behavior During Therapy [facility] for tasks assessed/performed      Past Medical History  Diagnosis Date  . Osteoarthritis   . Hypogonadism male   . BPH (benign prostatic hypertrophy) with urinary obstruction   . Hypertension   . Osteopenia   . Vitamin D deficiency   . Anxiety   . Depression   . Diverticulosis   . Colon polyps   . Hypercholesterolemia   . Glaucoma   . Sleep apnea   . Alcohol abuse     Remote hx, stopped in 1970  . History of pneumonia     Past Surgical History  Procedure Laterality Date  . Tonsillectomy    . Appendectomy  1945  . Shoulder surgery      rt  . Eye surgery      There were no vitals filed for this visit.  Visit Diagnosis:  Weakness generalized  Decreased strength, endurance, and mobility  Abnormality of gait      Subjective Assessment - 02/19/15 0940    Subjective Pt feels improvement with endurance and strength, and continues to consder TKA    Limitations Sitting;Standing;Walking   Currently in Pain? Yes   Pain Score 3    Pain Location Knee   Pain Orientation Left  no complains of pain in right today   Pain Descriptors / Indicators Sharp   Pain Type Chronic pain   Multiple Pain Sites No            [facility] PT Assessment - 02/19/15 0001    Observation/Other Assessments   Focus on Therapeutic Outcomes (FOTO)   47% limitation  CK status                     [facility] Adult PT Treatment/Exercise - 02/19/15 0001    Knee/Hip Exercises: Aerobic   Stationary Bike 4min L1Life Cycle seat # 20    Knee/Hip Exercises: Machines for Strengthening   Cybex Leg Press St8 115# B LE unable to perform today due to med Lt knee pain  reduced weight to 95#, but still painfu -l discontinued   Knee/Hip Exercises: Standing   Other Standing Knee Exercises Trampoline 3 directions x 2 min each, pt able to tolerate   Shoulder Exercises: ROM/Strengthening   UBE (Upper Arm Bike) Level 1 x 8 (4/4) seated   Manual Therapy   Manual Therapy Soft tissue mobilization   Soft tissue mobilization to Left knee with focus on med side                  PT Short Term Goals - 02/05/15 1014    PT SHORT TERM GOAL #1   Title be independent in initial HEP    Time 4   Period Weeks   Status Achieved   PT SHORT TERM GOAL #2  Title improve LE strength to perform sit to stand from regular height chair with moderate UE support   Time 4   Period Weeks   Status Achieved   PT SHORT TERM GOAL #3   Title improve endurance to stand and walk for 15 minutes without need to rest   Time 4   Period Weeks   Status Achieved           PT Long Term Goals - 2015/03/21 5176    PT LONG TERM GOAL #1   Title be independent in advanced HEP   Time 8   Period Weeks   Status On-going   PT LONG TERM GOAL #2   Title reduce FOTO to < or = to 50% limitation  As of 2023-03-21 scored 47% = CK status   Time 8   Period Weeks   Status Achieved   PT LONG TERM GOAL #3   Title improve LE strength to perform sit to stand with minimal to no UE support > 50% of the time from regular height chair   Time 8   Period Weeks   Status Achieved   PT LONG TERM GOAL #4   Title perform TUG in < or = to 12 seconds   Time 8   Period Weeks   Status Achieved   PT LONG TERM GOAL #5   Title imrpove endurance to tolerate standing and walking fro 20  minutes without need for rest   Time 8   Period Weeks   Status Achieved   PT LONG TERM GOAL #6   Title ascend and descend a curb with 50% improved ease   Time 8   Period Weeks   Status On-going               Plan - 2015/03/21 1022    Clinical Impression Statement Pt expierienced increased pain in left knee with leg press today. He describes it as bone on bone.    Pt will benefit from skilled therapeutic intervention in order to improve on the following deficits Abnormal gait;Impaired flexibility;Decreased mobility;Improper body mechanics;Decreased endurance;Decreased strength;Difficulty walking;Decreased balance   Rehab Potential Good   PT Frequency 2x / week   PT Duration 8 weeks   PT Treatment/Interventions ADLs/Self Care Home Management;Gait training;Therapeutic exercise;Patient/family education;Stair training;Balance training;Neuromuscular re-education;Therapeutic activities   Consulted and Agree with Plan of Care Patient          G-Codes - 21-Mar-2015 1143    Functional Assessment Tool Used FOTO: 47% limitation   Functional Limitation Mobility: Walking and moving around   Mobility: Walking and Moving Around Current Status [phone]) At least 40 percent but less than 60 percent impaired, limited or restricted   Mobility: Walking and Moving Around Goal Status [phone]) At least 40 percent but less than 60 percent impaired, limited or restricted      Problem List Patient Active Problem List   Diagnosis Date Noted  . CVA (cerebral infarction) 06/03/2013  . Syncope and collapse 06/03/2013  . Facial laceration 06/03/2013  . Acute bronchitis 06/03/2013  . Hypertension      Lance [name], PTA March 21, 2015 11:45 AM  21-Mar-2015, 11:45 AM Lance [name], PT March 21, 2015 11:47 AM  [facility] [address], [place], [place], [address] Phone: [phone]   Fax:  [phone]

## 2015-02-21 ENCOUNTER — Ambulatory Visit: Payer: Medicare Other | Admitting: Physical Therapy

## 2015-02-21 ENCOUNTER — Encounter: Payer: Self-pay | Admitting: Physical Therapy

## 2015-02-21 DIAGNOSIS — R269 Unspecified abnormalities of gait and mobility: Secondary | ICD-10-CM

## 2015-02-21 DIAGNOSIS — R531 Weakness: Secondary | ICD-10-CM

## 2015-02-21 DIAGNOSIS — Z7409 Other reduced mobility: Secondary | ICD-10-CM

## 2015-02-21 NOTE — Therapy (Signed)
[facility] [address], [place], [place], [address] Phone: [phone]   Fax:  [phone]  Physical Therapy Treatment  Patient Details  Name: Lance [name] MRN: [id number] Date of Birth: [birth year] Referring Provider:  Crist Infante, MD  Encounter Date: 02/21/2015      PT End of Session - 02/21/15 0937    Visit Number 21   Number of Visits 30   Date for PT Re-Evaluation 03/15/15   PT Start Time 0929   PT Stop Time 1015   PT Time Calculation (min) 46 min   Activity Tolerance Patient limited by pain   Behavior During Therapy [facility] for tasks assessed/performed      Past Medical History  Diagnosis Date  . Osteoarthritis   . Hypogonadism male   . BPH (benign prostatic hypertrophy) with urinary obstruction   . Hypertension   . Osteopenia   . Vitamin D deficiency   . Anxiety   . Depression   . Diverticulosis   . Colon polyps   . Hypercholesterolemia   . Glaucoma   . Sleep apnea   . Alcohol abuse     Remote hx, stopped in 1970  . History of pneumonia     Past Surgical History  Procedure Laterality Date  . Tonsillectomy    . Appendectomy  1945  . Shoulder surgery      rt  . Eye surgery      There were no vitals filed for this visit.  Visit Diagnosis:  Weakness generalized  Decreased strength, endurance, and mobility  Abnormality of gait      Subjective Assessment - 02/21/15 0933    Subjective Pt continues to improve from therapy as noted in improvement with endurance and strength, TKA proberly in fall    Limitations Sitting;Standing;Walking   Currently in Pain? Yes   Pain Score 4    Pain Location Knee   Pain Orientation Left   Pain Descriptors / Indicators Sharp;Crushing   Pain Type Chronic pain   Pain Onset More than a month ago   Pain Frequency Intermittent   Multiple Pain Sites No                         [facility] Adult PT Treatment/Exercise - 02/21/15 0001    Exercises   Exercises Knee/Hip   Knee/Hip Exercises: Aerobic   Stationary Bike 45min L1Life Cycle seat # 20    Knee/Hip Exercises: Machines for Strengthening   Cybex Leg Press St8 80# 3x10 B LE, able to perform, 40# 3x10 Rt LE   Knee/Hip Exercises: Standing   Other Standing Knee Exercises Trampoline 3 directions x 2 min each, pt able to tolerate  with 1 UE support    Shoulder Exercises: ROM/Strengthening   UBE (Upper Arm Bike) Level 1 x 8 (4/4) seated   Manual Therapy   Manual Therapy Soft tissue mobilization   Soft tissue mobilization to Left knee with focus on med side                  PT Short Term Goals - 02/05/15 1014    PT SHORT TERM GOAL #1   Title be independent in initial HEP    Time 4   Period Weeks   Status Achieved   PT SHORT TERM GOAL #2   Title improve LE strength to perform sit to stand from regular height chair with moderate UE support   Time 4   Period Weeks  Status Achieved   PT SHORT TERM GOAL #3   Title improve endurance to stand and walk for 15 minutes without need to rest   Time 4   Period Weeks   Status Achieved           PT Long Term Goals - 02/19/15 0954    PT LONG TERM GOAL #1   Title be independent in advanced HEP   Time 8   Period Weeks   Status On-going   PT LONG TERM GOAL #2   Title reduce FOTO to < or = to 50% limitation  As of May 24,16 scored 47% = CK status   Time 8   Period Weeks   Status Achieved   PT LONG TERM GOAL #3   Title improve LE strength to perform sit to stand with minimal to no UE support > 50% of the time from regular height chair   Time 8   Period Weeks   Status Achieved   PT LONG TERM GOAL #4   Title perform TUG in < or = to 12 seconds   Time 8   Period Weeks   Status Achieved   PT LONG TERM GOAL #5   Title imrpove endurance to tolerate standing and walking fro 20 minutes without need for rest   Time 8   Period Weeks   Status Achieved   PT LONG TERM GOAL #6   Title ascend and descend a curb  with 50% improved ease   Time 8   Period Weeks   Status On-going               Plan - 02/21/15 5465    Clinical Impression Statement Pt continues to have most limitations due to pain in left knee "bone on bone"   Pt will benefit from skilled therapeutic intervention in order to improve on the following deficits Abnormal gait;Impaired flexibility;Decreased mobility;Improper body mechanics;Decreased endurance;Decreased strength;Difficulty walking;Decreased balance   Rehab Potential Good   PT Frequency 2x / week   PT Duration 8 weeks   PT Treatment/Interventions ADLs/Self Care Home Management;Gait training;Therapeutic exercise;Patient/family education;Stair training;Balance training;Neuromuscular re-education;Therapeutic activities   PT Next Visit Plan Continue with endurance and strength to patients tolerance   Consulted and Agree with Plan of Care Patient        Problem List Patient Active Problem List   Diagnosis Date Noted  . CVA (cerebral infarction) 06/03/2013  . Syncope and collapse 06/03/2013  . Facial laceration 06/03/2013  . Acute bronchitis 06/03/2013  . Hypertension     NAUMANN-HOUEGNIFIO,Silveria Botz PTA 02/21/2015, 10:13 AM  [facility] [address], [place], [place], [address] Phone: [phone]   Fax:  [phone]

## 2015-02-26 ENCOUNTER — Ambulatory Visit: Payer: Medicare Other

## 2015-02-26 DIAGNOSIS — Z7409 Other reduced mobility: Secondary | ICD-10-CM

## 2015-02-26 DIAGNOSIS — R6889 Other general symptoms and signs: Secondary | ICD-10-CM

## 2015-02-26 DIAGNOSIS — R531 Weakness: Secondary | ICD-10-CM | POA: Diagnosis not present

## 2015-02-26 DIAGNOSIS — R269 Unspecified abnormalities of gait and mobility: Secondary | ICD-10-CM

## 2015-02-26 NOTE — Therapy (Addendum)
[facility] [address], [place], [place], [address] Phone: [phone]   Fax:  [phone]  Physical Therapy Treatment  Patient Details  Name: Lance [name] MRN: [id number] Date of Birth: 06/26/1932 Referring Provider:  Crist Infante, MD  Encounter Date: 02/26/2015      PT End of Session - 02/26/15 0958    Visit Number 22   Number of Visits 30   Date for PT Re-Evaluation 03/15/15   PT Start Time 0922   PT Stop Time 1005   PT Time Calculation (min) 43 min   Activity Tolerance Patient limited by pain   Behavior During Therapy [facility] for tasks assessed/performed      Past Medical History  Diagnosis Date  . Osteoarthritis   . Hypogonadism male   . BPH (benign prostatic hypertrophy) with urinary obstruction   . Hypertension   . Osteopenia   . Vitamin D deficiency   . Anxiety   . Depression   . Diverticulosis   . Colon polyps   . Hypercholesterolemia   . Glaucoma   . Sleep apnea   . Alcohol abuse     Remote hx, stopped in 1970  . History of pneumonia     Past Surgical History  Procedure Laterality Date  . Tonsillectomy    . Appendectomy  1945  . Shoulder surgery      rt  . Eye surgery      There were no vitals filed for this visit.  Visit Diagnosis:  Weakness generalized  Decreased strength, endurance, and mobility  Abnormality of gait      Subjective Assessment - 02/26/15 0925    Subjective Doing pretty well overall; still limited with walking/standing due to Lt knee pain.    How long can you walk comfortably? Able to walk about 10 minutes before noticing Lt knee pain and needing a break   Currently in Pain? Yes   Pain Score 2    Pain Location Knee   Pain Orientation Left   Pain Descriptors / Indicators Dull;Aching   Pain Frequency Constant                         [facility] Adult PT Treatment/Exercise - 02/26/15 0935    Knee/Hip Exercises: Aerobic   Stationary Bike  44min L1Life Cycle seat # 20    Knee/Hip Exercises: Machines for Strengthening   Cybex Leg Press St8 85# 3x10 B LE, able to perform, 45# 3x10 Rt LE  Increased pain with Lt knee, changed weight 30#   Knee/Hip Exercises: Standing   Heel Raises 3 sets;10 reps  Two hand hold on treadmill   SLS 30 secondsx3 each leg; on trampoline   1 UE support   Other Standing Knee Exercises Sit to stand 3x10 from raised treatment table   Other Standing Knee Exercises Braiding at counter x4   moderate verbal cueing required to appropriate pattern   Shoulder Exercises: ROM/Strengthening   UBE (Upper Arm Bike) Level 1 x 8 (4/4) seated                  PT Short Term Goals - 02/05/15 1014    PT SHORT TERM GOAL #1   Title be independent in initial HEP    Time 4   Period Weeks   Status Achieved   PT SHORT TERM GOAL #2   Title improve LE strength to perform sit to stand from regular height chair with moderate UE  support   Time 4   Period Weeks   Status Achieved   PT SHORT TERM GOAL #3   Title improve endurance to stand and walk for 15 minutes without need to rest   Time 4   Period Weeks   Status Achieved           PT Long Term Goals - 02/26/15 1002    PT LONG TERM GOAL #6   Title ascend and descend a curb with 50% improved ease  Uses cane to ascend/descend; has pain with activity due to L knee pain   Time 8   Status On-going               Plan - 02/26/15 0959    Clinical Impression Statement Pt is progressing with exercises for balance and functional strengthening. Limited L knee strength due to pain; pt planning TKA in the fall. Discussed D/C at next visit for patient.    Pt will benefit from skilled therapeutic intervention in order to improve on the following deficits Abnormal gait;Impaired flexibility;Decreased mobility;Improper body mechanics;Decreased endurance;Decreased strength;Difficulty walking;Decreased balance   Rehab Potential Good   PT Frequency 2x / week   PT  Duration 8 weeks   PT Treatment/Interventions ADLs/Self Care Home Management;Gait training;Therapeutic exercise;Patient/family education;Stair training;Balance training;Neuromuscular re-education;Therapeutic activities   PT Next Visit Plan D/C next visit, check through goals   Consulted and Agree with Plan of Care Patient        Problem List Patient Active Problem List   Diagnosis Date Noted  . CVA (cerebral infarction) 06/03/2013  . Syncope and collapse 06/03/2013  . Facial laceration 06/03/2013  . Acute bronchitis 06/03/2013  . Hypertension    Reginal Lutes, SPT 02/26/2015 11:27 AM  [place] [facility] [address], [place], [place], [address] Phone: [phone]   Fax:  [phone]

## 2015-02-28 ENCOUNTER — Ambulatory Visit: Payer: Medicare Other | Attending: Orthopaedic Surgery

## 2015-02-28 DIAGNOSIS — Z7409 Other reduced mobility: Secondary | ICD-10-CM | POA: Insufficient documentation

## 2015-02-28 DIAGNOSIS — R269 Unspecified abnormalities of gait and mobility: Secondary | ICD-10-CM

## 2015-02-28 DIAGNOSIS — R531 Weakness: Secondary | ICD-10-CM | POA: Diagnosis present

## 2015-02-28 NOTE — Therapy (Addendum)
[facility] [address], [place], [place], [address] Phone: [phone]   Fax:  [phone]  Physical Therapy Treatment  Patient Details  Name: Lance [name] MRN: [id number] Date of Birth: [birth year] Referring Provider:  [name]  Encounter Date: 02/28/2015      PT End of Session - 02/28/15 0932    Visit Number 23   Number of Visits 30   Date for PT Re-Evaluation 03/15/15   PT Start Time 0929   PT Stop Time 1016   PT Time Calculation (min) 47 min   Activity Tolerance Patient limited by pain   Behavior During Therapy [facility] for tasks assessed/performed      Past Medical History  Diagnosis Date  . Osteoarthritis   . Hypogonadism male   . BPH (benign prostatic hypertrophy) with urinary obstruction   . Hypertension   . Osteopenia   . Vitamin D deficiency   . Anxiety   . Depression   . Diverticulosis   . Colon polyps   . Hypercholesterolemia   . Glaucoma   . Sleep apnea   . Alcohol abuse     Remote hx, stopped in 1970  . History of pneumonia     Past Surgical History  Procedure Laterality Date  . Tonsillectomy    . Appendectomy  1945  . Shoulder surgery      rt  . Eye surgery      There were no vitals filed for this visit.  Visit Diagnosis:  Weakness generalized  Decreased strength, endurance, and mobility  Abnormality of gait      Subjective Assessment - 02/28/15 0934    Subjective Feeling pretty good overall. Will be seeing MD next week about knee replacements   Currently in Pain? Yes   Pain Score 3    Pain Location Knee   Pain Orientation Left;Right   Pain Descriptors / Indicators Dull;Discomfort   Pain Type Chronic pain   Pain Onset More than a month ago   Pain Frequency Constant   Aggravating Factors  Increase in activity, walking, stairs    Pain Relieving Factors Rest   Effect of Pain on Daily Activities Limited activity for daily function             [facility] PT Assessment - 02/28/15 0001    Assessment   Medical Diagnosis generalized deconditioning, LE weakness, OA bil knees   Onset Date/Surgical Date 05/28/14   Observation/Other Assessments   Focus on Therapeutic Outcomes (FOTO)  47% limitation  CK status                     [facility] Adult PT Treatment/Exercise - 02/28/15 0001    Knee/Hip Exercises: Stretches   Active Hamstring Stretch 20 seconds;3 reps   Piriformis Stretch 2 reps;10 seconds   Knee/Hip Exercises: Aerobic   Stationary Bike 76mn L1Life Cycle seat # 20    Knee/Hip Exercises: Machines for Strengthening   Cybex Leg Press St8 90# 3x10 B LE, able to perform, 45# 3x10 Rt LE  Increased pain with Lt knee, changed weight 30#   Knee/Hip Exercises: Standing   Heel Raises 3 sets;10 reps  Two hand hold on treadmill   SLS 30 secondsx3 each leg; on trampoline   1 UE support   Other Standing Knee Exercises Sit to stand 3x10 from raised treatment table  Limited due to fatigue today; only able to 2x5    Other Standing Knee Exercises side steppingx6 at counter; 1  UE support   Knee/Hip Exercises: Supine   Straight Leg Raises Strengthening;1 set;Both;10 reps                PT Education - 03/22/2015 1014    Education provided Yes   Education Details SLR, gym exercises, heel raises   Person(s) Educated Patient   Methods Explanation;Demonstration;Handout   Comprehension Returned demonstration;Verbalized understanding          PT Short Term Goals - 03/22/15 0937    PT SHORT TERM GOAL #1   Title be independent in initial HEP    Time 4   Period Weeks   Status Achieved   PT SHORT TERM GOAL #2   Title improve LE strength to perform sit to stand from regular height chair with moderate UE support   Time 4   Period Weeks   Status Achieved   PT SHORT TERM GOAL #3   Title improve endurance to stand and walk for 15 minutes without need to rest  Limited by Lt knee pain at this time   Time 4   Period Weeks    Status Achieved           PT Long Term Goals - 22-Mar-2015 1003    PT LONG TERM GOAL #1   Title be independent in advanced HEP  Pt provided information for HEP    Time 8   Period Weeks   Status Achieved   PT LONG TERM GOAL #2   Title reduce FOTO to < or = to 50% limitation   Time 8   Status Achieved   PT LONG TERM GOAL #3   Title improve LE strength to perform sit to stand with minimal to no UE support > 50% of the time from regular height chair   Period Weeks   Status Achieved   PT LONG TERM GOAL #4   Title perform TUG in < or = to 12 seconds   Period Days   Status Achieved   PT LONG TERM GOAL #5   Title imrpove endurance to tolerate standing and walking fro 20 minutes without need for rest   Status Achieved   PT LONG TERM GOAL #6   Title ascend and descend a curb with 50% improved ease   Status Partially Met               Plan - 03-22-15 1142    Clinical Impression Statement Pt limited by Lt knee pain during exercises today; unable to complete full sets of sit to stand today due to fatigue/knee pain. Pt provided with advanced HEP to do at gym and flexiblity exercises to perform at home.   Rehab Potential --   PT Frequency --   PT Duration --   PT Treatment/Interventions --   PT Next Visit Plan D/C PT    Consulted and Agree with Plan of Care Patient          G-Codes - 03-22-15 0950    Functional Assessment Tool Used FOTO: 47% limitation & clinical judgement   Functional Limitation Mobility: Walking and moving around   Mobility: Walking and Moving Around Goal Status [phone]) At least 40 percent but less than 60 percent impaired, limited or restricted   Mobility: Walking and Moving Around Discharge Status [phone]) At least 40 percent but less than 60 percent impaired, limited or restricted      Problem List Patient Active Problem List   Diagnosis Date Noted  . CVA (cerebral infarction) 06/03/2013  . Syncope  and collapse 06/03/2013  . Facial laceration  06/03/2013  . Acute bronchitis 06/03/2013  . Hypertension     Reginal Lutes, SPT 02/28/2015 11:57 AM PHYSICAL THERAPY DISCHARGE SUMMARY  Visits from Start of Care: 23  Current functional level related to goals / functional outcomes: See above for goal status.  Pt is independent in HEP for LE strength and endurance.     Remaining deficits: Pt with continued LE weakness and bilateral knee pain that limits endurance and function.  Pt has HEP in place and will continue with exercises for strength and endurance gains.  Pt plans to see MD to discuss continued knee pain.    Education / Equipment: HEP Plan: Patient agrees to discharge.  Patient goals were partially met. Patient is being discharged due to being pleased with the current functional level.  ?????   Sigurd Sos, PT 02/28/2015 12:02 PM  [facility] [address] [place], [place], [address] Phone: [phone]   Fax:  [phone]

## 2015-02-28 NOTE — Patient Instructions (Addendum)
Hip Flexion / Knee Extension: Straight-Leg Raise (Eccentric)   Lie on back. Lift leg with knee straight. Slowly lower leg for 3-5 seconds. _10__ reps per set, 2___ sets per day, ___ days per week. Copyright  VHI. All rights reserved.  Heel Raises   Stand with support. Tighten pelvic floor and hold. With knees straight, raise heels off ground. Hold __2_ seconds. Relax for _5__ seconds. Repeat _2x10__ times. Do ___ times a day.  Copyright  VHI. All rights reserved.  Bike: 20 minutes Leg press: 90 lbs with both legs  [facility] [address], [place], [place] [address] Phone # [phone] Fax [phone]

## 2015-03-05 ENCOUNTER — Encounter: Payer: Medicare Other | Admitting: Physical Therapy

## 2015-03-07 ENCOUNTER — Encounter: Payer: Medicare Other | Admitting: Physical Therapy

## 2015-03-12 ENCOUNTER — Encounter: Payer: Medicare Other | Admitting: Physical Therapy

## 2015-05-08 ENCOUNTER — Encounter: Payer: Self-pay | Admitting: Internal Medicine

## 2015-10-11 ENCOUNTER — Other Ambulatory Visit (HOSPITAL_COMMUNITY): Payer: Self-pay | Admitting: Internal Medicine

## 2015-10-11 ENCOUNTER — Ambulatory Visit (HOSPITAL_COMMUNITY)
Admission: RE | Admit: 2015-10-11 | Discharge: 2015-10-11 | Disposition: A | Discharge status: Home / Self Care | Payer: Medicare Other | Source: Ambulatory Visit | Attending: Internal Medicine | Admitting: Internal Medicine

## 2015-10-11 ENCOUNTER — Encounter (HOSPITAL_COMMUNITY): Payer: Self-pay

## 2015-10-11 ENCOUNTER — Other Ambulatory Visit: Payer: Self-pay | Admitting: Internal Medicine

## 2015-10-11 DIAGNOSIS — R0902 Hypoxemia: Secondary | ICD-10-CM

## 2015-10-11 DIAGNOSIS — I10 Essential (primary) hypertension: Secondary | ICD-10-CM | POA: Diagnosis not present

## 2015-10-11 DIAGNOSIS — M47894 Other spondylosis, thoracic region: Secondary | ICD-10-CM | POA: Diagnosis not present

## 2015-10-11 MED ORDER — IOHEXOL 350 MG/ML SOLN
100.0000 mL | Freq: Once | INTRAVENOUS | Status: AC | PRN
  Administered 2015-10-11: 100 mL via INTRAVENOUS

## 2015-10-15 ENCOUNTER — Ambulatory Visit (HOSPITAL_COMMUNITY): Admission: RE | Admit: 2015-10-15 | Payer: Medicare Other | Source: Ambulatory Visit | Admitting: Orthopaedic Surgery

## 2015-10-15 ENCOUNTER — Encounter (HOSPITAL_COMMUNITY): Admission: RE | Payer: Self-pay | Source: Ambulatory Visit

## 2015-10-15 SURGERY — ARTHROPLASTY, KNEE, UNICOMPARTMENTAL
Anesthesia: Choice | Laterality: Right

## 2015-10-16 ENCOUNTER — Other Ambulatory Visit: Payer: Self-pay | Admitting: Internal Medicine

## 2015-10-16 DIAGNOSIS — M79604 Pain in right leg: Secondary | ICD-10-CM

## 2015-10-16 DIAGNOSIS — M79605 Pain in left leg: Principal | ICD-10-CM

## 2015-10-22 ENCOUNTER — Ambulatory Visit (INDEPENDENT_AMBULATORY_CARE_PROVIDER_SITE_OTHER): Payer: Medicare Other | Admitting: Neurology

## 2015-10-22 ENCOUNTER — Encounter: Payer: Self-pay | Admitting: Neurology

## 2015-10-22 VITALS — BP 144/80 | HR 82 | Resp 20 | Ht 74.0 in | Wt 223.0 lb

## 2015-10-22 DIAGNOSIS — R490 Dysphonia: Secondary | ICD-10-CM

## 2015-10-22 DIAGNOSIS — R29898 Other symptoms and signs involving the musculoskeletal system: Secondary | ICD-10-CM | POA: Diagnosis not present

## 2015-10-22 DIAGNOSIS — R262 Difficulty in walking, not elsewhere classified: Secondary | ICD-10-CM | POA: Diagnosis not present

## 2015-10-22 DIAGNOSIS — G4733 Obstructive sleep apnea (adult) (pediatric): Secondary | ICD-10-CM

## 2015-10-22 DIAGNOSIS — J690 Pneumonitis due to inhalation of food and vomit: Secondary | ICD-10-CM | POA: Insufficient documentation

## 2015-10-22 DIAGNOSIS — Z9989 Dependence on other enabling machines and devices: Principal | ICD-10-CM

## 2015-10-22 MED ORDER — DOXEPIN HCL 6 MG PO TABS: 6.0000 mg | ORAL_TABLET | Freq: Every evening | ORAL | Status: DC

## 2015-10-22 NOTE — Progress Notes (Signed)
SLEEP MEDICINE CLINIC   Provider:  Larey Seat, M D  Referring Provider: Crist Infante, MD Primary Care Physician:  Jerlyn Ly, MD   Patient arrived 20 minutes after requested arrival time of 12.30 . Paper work not completed by 13.30 hours.    Chief Complaint  Patient presents with  . New Patient (Initial Visit)    cpap follow up, with whole family, wants to know if he needs o2 on cpap, weakness in legs, MRI pending, rm 10    HPI:  Lance [name] is a 82 y.o. male , seen here as a referral  from Dr. 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 I saw him last 5 years ago when he underwent a sleep study and he was placed on CPAP. His nocturnal polysomnography took place on 11/03/2010 upon referral of Dr. Crist Infante. He slept 381 minutes with an AHI of 31.300% of the night in supine sleep position. His REM sleep AHI was 48.9. Oxygen nadir was 76% with 143 minutes of desaturation time in total. For this reason a CPAP titration was recommended. The patient was treated for pneumonia as of December 2016 in [place]. Upon return Dr. Joylene Draft ordered an overnight pulse oximetry. This was performed by adult and pediatric specialists of [facility] and performed without CPAP use. On room air only the total oxygen desaturation time was 60 minutes and 8 seconds the largest the longest continuous.  Was 14 minutes and 24 seconds and seems to be artifact ( based on the shape of the curve, he was moving ). Oxygen desaturation index was 41.68. The patient had tachycardia and bradycardia at night his heart rate becoming slower as the sleep progressed.  Lance [name] reports that when her husband does not use CPAP he is sleepy throughout the coming day.The patient himself feels that he sleep sounder without the CPAP.   Unfortunately there are no data to be retrieved from his machine today there is no memory chip placed. He didn't qualify for oxygen  supplement based on the overnight pulse oximetry of CPAP. Since he did have a pneumonia just weeks before this test was performed is also not clear if this is a constant condition. What I would like to do is to reevaluate him for his current CPAP need and possibly re-titrate.  He would also be able to obtain an newer CPAP machine that could be WiFi  connected this way he would not have to repeat sleep studies in the future and we can access data of compliance through the Internet   Chief complaint according to patient : Lance [name] fields physically fatigued and weak now in the form of muscular heaviness and inability to ambulate and participate in social functions as he would like to. He is using a walker now. An MRI of his spine is pending scheduled for tomorrow. It will be important to see if he has spinal stenosis- the patient is treated for leg pain, he does not suffer any  back pain. He received steroid injections into both knee joints but only found temporary relief. But he indicated that first he had some knee base pain he now feels that the lateral knee Is affected and that the pain area has ascended into the thigh. Hyaluronic acid injections were not successful. He has been unable to undergo knee surgery  ( Dr. Rush Farmer)  as the true reason for his weakness has not been established. When he travelled to  [place] , he used a Radio producer, now he needs a walker. He has not fallen.  He denies periphaeral neuropathic symptoms.    Sleep habits are as follows:  The patient reports that he usually goes to bed between 11 and midnight, he falls asleep promptly. He will stay asleep for about 6 or 7 hours he may have one bathroom break in the early morning hours and goes easily to sleep again. Nocturnal sleep time is usually 7-8 hours He naps in daytime also he does not necessarly schedule his naps. His naps last one hour.   Social history: married, 2 children.   Review of Systems: Out of a complete 14  system review, the patient complains of only the following symptoms, and all other reviewed systems are negative. Walker dependent ambulation, difficulties to walk just from one room to another.  Walking became difficult at the time of the pneumonia.   Epworth score  9 , Fatigue severity score 39  , depression score 4   Social History   Social History  . Marital Status: Married    Spouse Name: N/A  . Number of Children: 3  . Years of Education: N/A   Occupational History  . retired    Social History Main Topics  . Smoking status: Former Smoker    Quit date: 09/28/1962  . Smokeless tobacco: Never Used  . Alcohol Use: No  . Drug Use: No  . Sexual Activity: Not on file   Other Topics Concern  . Not on file   Social History Narrative    No family history on file.  Past Medical History  Diagnosis Date  . Osteoarthritis   . Hypogonadism male   . BPH (benign prostatic hypertrophy) with urinary obstruction   . Hypertension   . Osteopenia   . Vitamin D deficiency   . Anxiety   . Depression   . Diverticulosis   . Colon polyps   . Hypercholesterolemia   . Glaucoma   . Sleep apnea   . Alcohol abuse     Remote hx, stopped in 1970  . History of pneumonia   . OSA on CPAP     Past Surgical History  Procedure Laterality Date  . Tonsillectomy    . Appendectomy  1945  . Shoulder surgery      rt  . Eye surgery      Current Outpatient Prescriptions  Medication Sig Dispense Refill  . amLODipine (NORVASC) 5 MG tablet Take 5 mg by mouth daily.  8  . Brimonidine Tartrate-Timolol (COMBIGAN OP) Apply 1 drop to eye 2 (two) times daily.    Marland Kitchen buPROPion (WELLBUTRIN SR) 150 MG 12 hr tablet Take 150 mg by mouth 2 (two) times daily.    Marland Kitchen CALCIUM CARBONATE PO Take 1 tablet by mouth daily.    . diazepam (VALIUM) 10 MG tablet Take 10 mg by mouth every 8 (eight) hours as needed for anxiety.     Marland Kitchen doxepin (SINEQUAN) 75 MG capsule Take 75 mg by mouth at bedtime.    . ergocalciferol  (VITAMIN D2) 50000 UNITS capsule Take 50,000 Units by mouth once a week. On monday    . fexofenadine (ALLEGRA) 180 MG tablet TAKE 1 TABLET BY MOUTH IN THE MORNING FOR ALLERGIES  1  . finasteride (PROSCAR) 5 MG tablet Take 10 mg by mouth every morning.    . meloxicam (MOBIC) 15 MG tablet 1 TABLET BY MOUTH DAILY AS NEEDED (PRN) FOR PAIN TAKE IT WITH FOOD  0  . Multiple Vitamin (MULTIVITAMIN) capsule Take 1 capsule by mouth once a week.    . OMEGA 3 1000 MG CAPS Take 1 capsule by mouth 3 (three) times daily.    . Tamsulosin HCl (FLOMAX) 0.4 MG CAPS Take 0.8 mg by mouth every morning.    . Travoprost (TRAVATAN OP) Apply 1 drop to eye at bedtime.    . valsartan (DIOVAN) 320 MG tablet Take 320 mg by mouth daily.  11  . vitamin C (ASCORBIC ACID) 500 MG tablet Take 500 mg by mouth daily.     No current facility-administered medications for this visit.    Allergies as of 10/22/2015 - Review Complete 10/22/2015  Allergen Reaction Noted  . Penicillins Anaphylaxis 10/26/2011  . Simvastatin  10/22/2015    Vitals: BP 144/80 mmHg  Pulse 82  Resp 20  Ht 6\' 2"  (1.88 m)  Wt 223 lb (101.152 kg)  BMI 28.62 kg/m2 Last Weight:  Wt Readings from Last 1 Encounters:  10/22/15 223 lb (101.152 kg)   [id number] mass index is 28.62 kg/(m^2).     Last Height:   Ht Readings from Last 1 Encounters:  10/22/15 6\' 2"  (1.88 m)    Physical exam:  General: The patient is awake, alert and appears not in acute distress. The patient is well groomed. Head: Normocephalic, atraumatic. Neck is supple. Mallampati 4 ,  neck circumference:17.5 . Nasal airflow unrestricted , TMJ click is noted ,no Retrognathia is seen.  Cardiovascular:  Regular rate and rhythm, without  murmurs or carotid bruit, and without distended neck veins. Respiratory: Lungs are clear to auscultation. Skin:  Without evidence of edema, or rash    Neurologic exam : The patient is awake and alert, oriented to place and time.   Memory subjective   described as intact.  Memory testing deferred.  MOCA:No flowsheet data found. MMSE:No flowsheet data found.     Attention span & concentration ability appears normal.  Speech is fluent,  with  dysphonia , hoarseness. Mood and affect are appropriate.  Cranial nerves: Pupils are equal and briskly reactive to light. Funduscopic exam without evidence of pallor or edema.  Extraocular movements  in vertical and horizontal planes are smoth and  intact and without nystagmus.  Visual fields by finger perimetry are intact. Hearing to finger rub intact.- with hearing aids.   Facial sensation intact to fine touch. Facial motor strength is symmetric and tongue and uvula move midline. Shoulder shrug was symmetrical.   Motor exam: right foot drooping,left . Shuffling.   Sensory:  Fine touch, pinprick and vibration were tested in all extremities. His feet are numb to touch.  Proprioception tested in the upper extremities was normal.  Coordination: Rapid alternating movements in the fingers/hands was normal.   Gait and station: Patient walks with personal one side assistance, came in on a walker .   Stance is stable and normal. He needs to brace himself to rise from a seated position, walks with me, without leaning on me. Tendency propulsive. Tandem gait is deferred. Turns with 4 Steps. Romberg testing is positive .  Deep tendon reflexes: in the upper extremities are symmetric and intact. Attenuated in both lower extremities, shuffling, absent achilles reflex.  Babinski maneuver response is absent  The patient was advised of the nature of the diagnosed sleep disorder , the treatment options and risks for general a health and wellness arising from not treating the condition.  I spent more than 55 minutes of face to face time  with the patient. His paper work took an enormous amount of time. Greater than 50% of time was spent in counseling and coordination of care. We have discussed the diagnosis and  differential and I answered the patient's questions.     Assessment:  After physical and neurologic examination, review of laboratory studies,  Personal review of imaging studies, reports of other /same  Imaging studies ,  Results of polysomnography/ neurophysiology testing and pre-existing records as far as provided in visit., my assessment is   1) OSA - on CPAP- no therapeutic data available. DME advanced home care. Needs a new sleep test . Order re-titration after one hour baseline with 02 and Co2.   2) ambulatory difficulties. He is weak - shuffling, knee pain, foot drop. Needs MRI spine tomorrow. [facility]. I will need to orderan EMG and NCV if the MRI doesn't give answers about the ambulatory difficulties.   3) Needs prolonged revisit with [place] next time.    Plan:  Treatment plan and additional workup :    NCV and EMG, MOCA , review MRI , Repeat PSG titration and possible oxygen need.      Asencion Partridge Finnleigh Marchetti MD  10/22/2015   CC: Crist Infante, [address], [place] [id number]

## 2015-10-22 NOTE — Patient Instructions (Addendum)
Fall Prevention in the Home  Falls can cause injuries and can affect people from all age groups. There are many simple things that you can do to make your home safe and to help prevent falls. WHAT CAN I DO ON THE OUTSIDE OF MY HOME?  Regularly repair the edges of walkways and driveways and fix any cracks.  Remove high doorway thresholds.  Trim any shrubbery on the main path into your home.  Use bright outdoor lighting.  Clear walkways of debris and clutter, including tools and rocks.  Regularly check that handrails are securely fastened and in good repair. Both sides of any steps should have handrails.  Install guardrails along the edges of any raised decks or porches.  Have leaves, snow, and ice cleared regularly.  Use sand or salt on walkways during winter months.  In the garage, clean up any spills right away, including grease or oil spills. WHAT CAN I DO IN THE BATHROOM?  Use night lights.  Install grab bars by the toilet and in the tub and shower. Do not use towel bars as grab bars.  Use non-skid mats or decals on the floor of the tub or shower.  If you need to sit down while you are in the shower, use a plastic, non-slip stool..  Keep the floor dry. Immediately clean up any water that spills on the floor.  Remove soap buildup in the tub or shower on a regular basis.  Attach bath mats securely with double-sided non-slip rug tape.  Remove throw rugs and other tripping hazards from the floor. WHAT CAN I DO IN THE BEDROOM?  Use night lights.  Make sure that a bedside light is easy to reach.  Do not use oversized bedding that drapes onto the floor.  Have a firm chair that has side arms to use for getting dressed.  Remove throw rugs and other tripping hazards from the floor. WHAT CAN I DO IN THE KITCHEN?   Clean up any spills right away.  Avoid walking on wet floors.  Place frequently used items in easy-to-reach places.  If you need to reach for something  above you, use a sturdy step stool that has a grab bar.  Keep electrical cables out of the way.  Do not use floor polish or wax that makes floors slippery. If you have to use wax, make sure that it is non-skid floor wax.  Remove throw rugs and other tripping hazards from the floor. WHAT CAN I DO IN THE STAIRWAYS?  Do not leave any items on the stairs.  Make sure that there are handrails on both sides of the stairs. Fix handrails that are broken or loose. Make sure that handrails are as long as the stairways.  Check any carpeting to make sure that it is firmly attached to the stairs. Fix any carpet that is loose or worn.  Avoid having throw rugs at the top or bottom of stairways, or secure the rugs with carpet tape to prevent them from moving.  Make sure that you have a light switch at the top of the stairs and the bottom of the stairs. If you do not have them, have them installed. WHAT ARE SOME OTHER FALL PREVENTION TIPS?  Wear closed-toe shoes that fit well and support your feet. Wear shoes that have rubber soles or low heels.  When you use a stepladder, make sure that it is completely opened and that the sides are firmly locked. Have someone hold the ladder while you   are using it. Do not climb a closed stepladder.  Add color or contrast paint or tape to grab bars and handrails in your home. Place contrasting color strips on the first and last steps.  Use mobility aids as needed, such as canes, walkers, scooters, and crutches.  Turn on lights if it is dark. Replace any light bulbs that burn out.  Set up furniture so that there are clear paths. Keep the furniture in the same spot.  Fix any uneven floor surfaces.  Choose a carpet design that does not hide the edge of steps of a stairway.  Be aware of any and all pets.  Review your medicines with your healthcare provider. Some medicines can cause dizziness or changes in blood pressure, which increase your risk of falling. Talk  with your health care provider about other ways that you can decrease your risk of falls. This may include working with a physical therapist or trainer to improve your strength, balance, and endurance.   This information is not intended to replace advice given to you by your health care provider. Make sure you discuss any questions you have with your health care provider.   Document Released: 09/04/2002 Document Revised: 01/29/2015 Document Reviewed: 10/19/2014 Elsevier Interactive Patient Education 2016 Elsevier Inc. Doxepin oral tablets What is this medicine? DOXEPIN (DOX e pin) is used to treat insomnia. This medicine helps you sleep through the night. This medicine may be used for other purposes; ask your health care provider or pharmacist if you have questions. What should I tell my health care provider before I take this medicine? They need to know if you have any of these conditions: -bipolar disorder -depression -difficulty passing urine -glaucoma -heart disease -history of a drug or alcohol abuse problem -liver disease -lung or breathing disease, like asthma or sleep apnea -prostate trouble -schizophrenia -suicidal thoughts, plans, or attempt; a previous suicide attempt by you or a family member -an unusual or allergic reaction to doxepin, other medicines, foods, dyes, or preservatives -pregnant or trying to get pregnant -breast-feeding How should I use this medicine? Take this medicine by mouth with a glass of water. Follow the directions on the prescription label. Take this medicine on an empty stomach, and not within 3 hours of a meal. This medicine should be taken within 30 minutes of going to sleep and only when you are able to get a full night of sleep before you must be active again. Do not take it more often than directed. Do not stop taking this medicine suddenly except upon the advice of your doctor. Stopping this medicine too quickly may cause serious side effects or  your condition may worsen. A special MedGuide will be given to you by the pharmacist with each prescription and refill. Be sure to read this information carefully each time. Talk to your pediatrician regarding the use of this medicine in children. Special care may be needed. Overdosage: If you think you have taken too much of this medicine contact a poison control center or emergency room at once. NOTE: This medicine is only for you. Do not share this medicine with others. What if I miss a dose? This does not apply. This medicine should only be taken before going to sleep. Do not take double or extra doses. What may interact with this medicine? Do not take this medicine with any of the following medications: -arsenic trioxide -certain medicines used to regulate abnormal heartbeat or to treat other heart conditions -cisapride -halofantrine -levomethadyl -linezolid -MAOIs  like Carbex, Eldepryl, Marplan, Nardil, and Parnate -methylene blue -other medicines for mental depression -phenothiazines like perphenazine, thioridazine and chlorpromazine -pimozide -procarbazine -sparfloxacin -St. John's Wort -ziprasidone This medicine may also interact with the following medications: -cimetidine -tolazamide This list may not describe all possible interactions. Give your health care provider a list of all the medicines, herbs, non-prescription drugs, or dietary supplements you use. Also tell them if you smoke, drink alcohol, or use illegal drugs. Some items may interact with your medicine. What should I watch for while using this medicine? Visit your doctor or health care professional for regular checks on your progress. Keep a regular sleep schedule by going to bed at about the same time each night. Avoid caffeine-containing drinks in the evening hours. Talk to your doctor if you still have trouble sleeping within 7 to 10 days of using this medicine. This may mean there is another cause for your sleep  problems. Do not take this medicine unless you are able to get a full night of sleep before you must be active again. After taking this medicine, do not drive, use machinery, or do anything that needs mental alertness. Take this medicine only within 30 minutes of going to bed and then confine your activities to those needed to get ready for sleep. You may still feel drowsy the next day after taking this medicine. If this happens, do not drive, use machinery, or do anything that needs mental alertness until you feel fully awake. Do not stand or sit up quickly, especially if you are an older patient. This reduces the risk of dizzy or fainting spells. After taking this medicine for sleep, you may get up out of bed while not being fully awake and do an activity that you do not know you are doing. The next morning, you may have no memory of the event. Activities such as driving a car ("sleep-driving"), making and eating food, talking on the phone, sexual activity, and sleep-walking have been reported. Call your doctor right away if you find out you have done any of these activities. Do not take this medicine if you drink alcohol or have taken another medicine for sleep, since your risk of doing these sleep-related activities will be increased. Patients and their families should watch out for new or worsening thoughts of suicide or depression. Also watch out for sudden changes in feelings such as feeling anxious, agitated, panicky, irritable, hostile, aggressive, impulsive, severely restless, overly excited and hyperactive, or not being able to sleep. If this happens, especially at the beginning of treatment or after a change in dose, call your health care professional. Your mouth may get dry. Chewing sugarless gum or sucking hard candy, and drinking plenty of water may help. Contact your doctor if the problem does not go away or is severe. This medicine may cause dry eyes and blurred vision. If you wear contact  lenses you may feel some discomfort. Lubricating drops may help. See your eye doctor if the problem does not go away or is severe. This medicine can cause constipation. Try to have a bowel movement at least every 2 to 3 days. If you do not have a bowel movement for 3 days, call your doctor or health care professional. This medicine can make you more sensitive to the sun. Keep out of the sun. If you cannot avoid being in the sun, wear protective clothing and use sunscreen. Do not use sun lamps or tanning beds/booths. What side effects may I notice from receiving  this medicine? Side effects that you should report to your doctor or health care professional as soon as possible: -allergic reactions like skin rash, itching or hives, swelling of the face, lips, or tongue -breathing problems -confusion, hallucinations -fast, irregular or pounding heartbeat -fever or infection -suicidal thoughts or other mood changes -trouble passing urine or change in the amount of urine -unusual activities while asleep like driving, eating, making phone calls Side effects that usually do not require medical attention (Report these to your doctor or health care professional if they continue or are bothersome.): -constipation -daytime drowsiness -dry mouth -nausea -weight gain This list may not describe all possible side effects. Call your doctor for medical advice about side effects. You may report side effects to FDA at 1-800-FDA-1088. Where should I keep my medicine? Keep out of the reach of children. Store at room temperature between 20 and 25 degrees C (68 and 77 degrees F). Throw away any unused medicine after the expiration date. NOTE: This sheet is a summary. It may not cover all possible information. If you have questions about this medicine, talk to your doctor, pharmacist, or health care provider.    2016, Elsevier/Gold Standard. (2014-05-15 17:44:47)  My goal is for the patient to reduce his doxepin  intake from 75 mg tablets to a half at night. Over the long-term I would like to reduce him to 6 or 12 mg at night if possible.

## 2015-10-23 ENCOUNTER — Ambulatory Visit
Admission: RE | Admit: 2015-10-23 | Discharge: 2015-10-23 | Disposition: A | Discharge status: Home / Self Care | Payer: Medicare Other | Source: Ambulatory Visit | Attending: Internal Medicine | Admitting: Internal Medicine

## 2015-10-23 DIAGNOSIS — M79605 Pain in left leg: Principal | ICD-10-CM

## 2015-10-23 DIAGNOSIS — M79604 Pain in right leg: Secondary | ICD-10-CM

## 2015-10-30 ENCOUNTER — Other Ambulatory Visit: Payer: Self-pay | Admitting: Neurology

## 2015-10-30 MED ORDER — DOXEPIN HCL 75 MG PO CAPS: 75.0000 mg | ORAL_CAPSULE | Freq: Every day | ORAL | Status: DC

## 2015-10-30 NOTE — Addendum Note (Signed)
Addended by: Lester [place] A on: 10/30/2015 05:25 PM   Modules accepted: Orders

## 2015-10-30 NOTE — Telephone Encounter (Signed)
Wife called regarding the new sleep medication Dr. Brett Fairy prescribed recently, states it costs $1,000 for 90, they refused the prescription, would prefer to go back to DOXEPIN. Also wants Dr. Brett Fairy to know, tonight someone will be bringing out a piece for oxygen with CPAP that Dr. Joylene Draft ordered.

## 2015-10-30 NOTE — Telephone Encounter (Signed)
I could not believe this price ! This is an old medication. I'm happy to give her the doxepin tablet that she can cut in half and quarter. I will be happy to give her a 90 day supply again. Good luck with oxygen , this may help you very much. CD

## 2015-10-30 NOTE — Telephone Encounter (Signed)
Spoke with pt's wife. She states that the doxepin HCL 6mg  90 day supply will be $1000 and they do not want to pay that. She is requesting an RX for the doxepin 75 mg po qhs 90 day supply. Pt was taking this and it only was $8.  I advised her that I would ask Dr. Brett Fairy if this was ok, and then I would call her back.

## 2015-10-31 NOTE — Telephone Encounter (Signed)
I advised pt's wife that Dr. Brett Fairy ordered the doxepin 75mg  capsules for the pt and it was sent to their [facility]. Pt's wife verbalized understanding and appreciation.

## 2015-11-05 ENCOUNTER — Other Ambulatory Visit: Payer: Self-pay | Admitting: Neurosurgery

## 2015-11-06 ENCOUNTER — Telehealth: Payer: Self-pay | Admitting: Neurology

## 2015-11-06 NOTE — Telephone Encounter (Signed)
The ONO on cpap was ordered by Dr. Joylene Draft and not Dr. Brett Fairy. I advised pt's wife that she would need to call Dr. Silvestre Mesi office for the results of that test done thru [facility]. Pt's wife verbalized understanding.

## 2015-11-06 NOTE — Telephone Encounter (Signed)
Returned pt's call. The only test that I see that Dr. Brett Fairy ordered is the cpap titration. Dr. Joylene Draft ( pt's PCP) ordered an MRI at the end of January, but Dr. Joylene Draft would need to review those results with the pt. If pt has not completed the cpap titration, then we do not have any results for him.  I called pt to discuss. Pt's home number just keeps ringing and does not go to voicemail. I left a message at the mobile number listed to call me back.

## 2015-11-06 NOTE — Telephone Encounter (Signed)
Pt's wife called returning Kristen's call. She is somewhat confused on dates. She said her husband became ill in [place] 12/17 or 09/25/15. When they returned home he saw Dr Joylene Draft dx pneumonia or bronchitis (she does not have dates). At this pt her phone rang, it was a call from Randall so we discontinued out conversation.

## 2015-11-06 NOTE — Telephone Encounter (Signed)
Patient called to say they couldn't get the CPAP sleep study done until after surgery.  Patient is having surgery on 11/11/15.  Patient hasn't received any results of the first test.  Please call patient and let him know these results

## 2015-11-07 ENCOUNTER — Other Ambulatory Visit (HOSPITAL_COMMUNITY): Payer: Self-pay | Admitting: Neurosurgery

## 2015-11-08 ENCOUNTER — Encounter (HOSPITAL_COMMUNITY): Payer: Self-pay

## 2015-11-08 ENCOUNTER — Encounter (HOSPITAL_COMMUNITY)
Admission: RE | Admit: 2015-11-08 | Discharge: 2015-11-08 | Disposition: A | Discharge status: Home / Self Care | Payer: Medicare Other | Source: Ambulatory Visit | Attending: Neurosurgery | Admitting: Neurosurgery

## 2015-11-08 ENCOUNTER — Other Ambulatory Visit (HOSPITAL_COMMUNITY): Payer: Self-pay | Admitting: *Deleted

## 2015-11-08 DIAGNOSIS — I10 Essential (primary) hypertension: Secondary | ICD-10-CM | POA: Diagnosis not present

## 2015-11-08 DIAGNOSIS — M5416 Radiculopathy, lumbar region: Secondary | ICD-10-CM | POA: Diagnosis not present

## 2015-11-08 DIAGNOSIS — Z87891 Personal history of nicotine dependence: Secondary | ICD-10-CM | POA: Diagnosis not present

## 2015-11-08 DIAGNOSIS — Z880 Allergy status to penicillin: Secondary | ICD-10-CM | POA: Diagnosis not present

## 2015-11-08 DIAGNOSIS — G4733 Obstructive sleep apnea (adult) (pediatric): Secondary | ICD-10-CM | POA: Diagnosis not present

## 2015-11-08 DIAGNOSIS — R531 Weakness: Secondary | ICD-10-CM | POA: Diagnosis not present

## 2015-11-08 DIAGNOSIS — M4806 Spinal stenosis, lumbar region: Secondary | ICD-10-CM | POA: Diagnosis not present

## 2015-11-08 DIAGNOSIS — Z7982 Long term (current) use of aspirin: Secondary | ICD-10-CM | POA: Diagnosis not present

## 2015-11-08 HISTORY — DX: Headache, unspecified: R51.9

## 2015-11-08 HISTORY — DX: Headache: R51

## 2015-11-08 LAB — BASIC METABOLIC PANEL
Anion gap: 10 (ref 5–15)
BUN: 15 mg/dL (ref 6–20)
CALCIUM: 9.1 mg/dL (ref 8.9–10.3)
CO2: 26 mmol/L (ref 22–32)
Chloride: 105 mmol/L (ref 101–111)
Creatinine, Ser: 0.91 mg/dL (ref 0.61–1.24)
GFR calc Af Amer: >60 mL/min (ref >60)
GLUCOSE: 114 mg/dL — AB (ref 65–99)
Potassium: 4 mmol/L (ref 3.5–5.1)
Sodium: 141 mmol/L (ref 135–145)

## 2015-11-08 LAB — SURGICAL PCR SCREEN
MRSA, PCR: NEGATIVE
STAPHYLOCOCCUS AUREUS: POSITIVE — AB

## 2015-11-08 LAB — CBC
HEMATOCRIT: 43.9 % (ref 39.0–52.0)
Hemoglobin: 14.3 g/dL (ref 13.0–17.0)
MCH: 30.8 pg (ref 26.0–34.0)
MCHC: 32.6 g/dL (ref 30.0–36.0)
MCV: 94.4 fL (ref 78.0–100.0)
Platelets: 267 10*3/uL (ref 150–400)
RBC: 4.65 MIL/uL (ref 4.22–5.81)
RDW: 13 % (ref 11.5–15.5)
WBC: 7.9 10*3/uL (ref 4.0–10.5)

## 2015-11-08 MED ORDER — DEXAMETHASONE SODIUM PHOSPHATE 10 MG/ML IJ SOLN
10.0000 mg | INTRAMUSCULAR | Status: AC
  Administered 2015-11-11: 10 mg via INTRAVENOUS
  Filled 2015-11-08: qty 1

## 2015-11-08 MED ORDER — VANCOMYCIN HCL 10 G IV SOLR
1500.0000 mg | INTRAVENOUS | Status: AC
  Administered 2015-11-11: 1500 mg via INTRAVENOUS
  Filled 2015-11-08: qty 1500

## 2015-11-08 NOTE — Pre-Procedure Instructions (Signed)
    Lance [name]  11/08/2015      [id number] [name], [place] [address] [address] Phone: [phone] Fax: [phone]    Your procedure is scheduled on 11-11-2015    Monday .  Report to [facility] Admitting at 5:30 A.M.   Call this number if you have problems the morning of surgery:  [phone]   Remember:  Do not eat food or drink liquids after midnight.   Take these medicines the morning of surgery with A SIP OF WATER Amlodipine(Norvasc).Combigan eye drops,bupropion(Wellbutrin),Diazepam(Valium)if needed,Finasteride(Proscar),Pain medication if needed,Tamsulosin(Flomax),Timolol eye drops,   Do not wear jewelry  Do not wear lotions, powders, or perfumes.  You may not wear deodorant.  Do not shave 48 hours prior to surgery.  Men may shave face and neck.   Do not bring valuables to the hospital.  [facility] is not responsible for any belongings or valuables.  Contacts, dentures or bridgework may not be worn into surgery.  Leave your suitcase in the car.  After surgery it may be brought to your room.  For patients admitted to the hospital, discharge time will be determined by your treatment team.  Patients discharged the day of surgery will not be allowed to drive home.    Special instructions:  See attached sheet for instructions on CHG showers  Please read over the following fact sheets that you were given. Pain Booklet and Surgical Site Infection Prevention

## 2015-11-08 NOTE — Progress Notes (Signed)
I called a prescription for Mupirocin ointment to [facility], [place], [place].

## 2015-11-08 NOTE — Progress Notes (Signed)
Per Dr. Maureen Chatters note 10-22-2015 ,pt. To have another sleep study done but will have to be postponed until after surgery.

## 2015-11-10 NOTE — Anesthesia Preprocedure Evaluation (Addendum)
Anesthesia Evaluation  Patient identified by MRN, date of birth, ID band Patient awake    Reviewed: Allergy & Precautions, NPO status , Patient's Chart, lab work & pertinent test results  History of Anesthesia Complications Negative for: history of anesthetic complications  Airway Mallampati: II  TM Distance: >3 FB Neck ROM: Full    Dental no notable dental hx. (+) Dental Advisory Given, Poor Dentition   Pulmonary sleep apnea and Continuous Positive Airway Pressure Ventilation , former smoker,    Pulmonary exam normal breath sounds clear to auscultation       Cardiovascular hypertension, Pt. on medications Normal cardiovascular exam Rhythm:Regular Rate:Normal     Neuro/Psych  Headaches, PSYCHIATRIC DISORDERS Anxiety Depression    GI/Hepatic negative GI ROS, Neg liver ROS,   Endo/Other  negative endocrine ROS  Renal/GU negative Renal ROS  negative genitourinary   Musculoskeletal  (+) Arthritis , Osteoarthritis,    Abdominal   Peds negative pediatric ROS (+)  Hematology negative hematology ROS (+)   Anesthesia Other Findings   Reproductive/Obstetrics negative OB ROS                            Anesthesia Physical Anesthesia Plan  ASA: II  Anesthesia Plan: General   Post-op Pain Management:    Induction: Intravenous  Airway Management Planned: Oral ETT  Additional Equipment:   Intra-op Plan:   Post-operative Plan: Extubation in OR  Informed Consent: I have reviewed the patients History and Physical, chart, labs and discussed the procedure including the risks, benefits and alternatives for the proposed anesthesia with the patient or authorized representative who has indicated his/her understanding and acceptance.   Dental advisory given  Plan Discussed with: CRNA  Anesthesia Plan Comments:         Anesthesia Quick Evaluation

## 2015-11-11 ENCOUNTER — Encounter (HOSPITAL_COMMUNITY): Payer: Self-pay | Admitting: Anesthesiology

## 2015-11-11 ENCOUNTER — Encounter (HOSPITAL_COMMUNITY)
Admission: RE | Disposition: A | Discharge status: Home / Self Care | Payer: Self-pay | Source: Ambulatory Visit | Attending: Neurosurgery

## 2015-11-11 ENCOUNTER — Ambulatory Visit (HOSPITAL_COMMUNITY): Payer: Medicare Other

## 2015-11-11 ENCOUNTER — Ambulatory Visit (HOSPITAL_COMMUNITY)
Admission: RE | Admit: 2015-11-11 | Discharge: 2015-11-12 | Disposition: A | Discharge status: Home / Self Care | Payer: Medicare Other | Source: Ambulatory Visit | Attending: Neurosurgery | Admitting: Neurosurgery

## 2015-11-11 ENCOUNTER — Ambulatory Visit (HOSPITAL_COMMUNITY): Payer: Medicare Other | Admitting: Anesthesiology

## 2015-11-11 DIAGNOSIS — I10 Essential (primary) hypertension: Secondary | ICD-10-CM | POA: Insufficient documentation

## 2015-11-11 DIAGNOSIS — M48061 Spinal stenosis, lumbar region without neurogenic claudication: Secondary | ICD-10-CM | POA: Diagnosis present

## 2015-11-11 DIAGNOSIS — Z7982 Long term (current) use of aspirin: Secondary | ICD-10-CM | POA: Insufficient documentation

## 2015-11-11 DIAGNOSIS — G4733 Obstructive sleep apnea (adult) (pediatric): Secondary | ICD-10-CM | POA: Diagnosis not present

## 2015-11-11 DIAGNOSIS — Z87891 Personal history of nicotine dependence: Secondary | ICD-10-CM | POA: Insufficient documentation

## 2015-11-11 DIAGNOSIS — M545 Low back pain: Secondary | ICD-10-CM

## 2015-11-11 DIAGNOSIS — M5416 Radiculopathy, lumbar region: Secondary | ICD-10-CM | POA: Insufficient documentation

## 2015-11-11 DIAGNOSIS — Z880 Allergy status to penicillin: Secondary | ICD-10-CM | POA: Insufficient documentation

## 2015-11-11 DIAGNOSIS — R531 Weakness: Secondary | ICD-10-CM | POA: Insufficient documentation

## 2015-11-11 DIAGNOSIS — M549 Dorsalgia, unspecified: Secondary | ICD-10-CM

## 2015-11-11 DIAGNOSIS — M4806 Spinal stenosis, lumbar region: Secondary | ICD-10-CM | POA: Insufficient documentation

## 2015-11-11 HISTORY — PX: LUMBAR LAMINECTOMY/DECOMPRESSION MICRODISCECTOMY: SHX5026

## 2015-11-11 SURGERY — LUMBAR LAMINECTOMY/DECOMPRESSION MICRODISCECTOMY 1 LEVEL
Anesthesia: General | Site: Back | Laterality: Bilateral

## 2015-11-11 MED ORDER — MENTHOL 3 MG MT LOZG
1.0000 | LOZENGE | OROMUCOSAL | Status: DC | PRN
Start: 2015-11-11 — End: 2015-11-12

## 2015-11-11 MED ORDER — GLYCOPYRROLATE 0.2 MG/ML IJ SOLN
INTRAMUSCULAR | Status: AC
  Filled 2015-11-11: qty 3

## 2015-11-11 MED ORDER — ASPIRIN 325 MG PO TABS
650.0000 mg | ORAL_TABLET | Freq: Every day | ORAL | Status: DC
  Filled 2015-11-11: qty 2

## 2015-11-11 MED ORDER — NEOSTIGMINE METHYLSULFATE 10 MG/10ML IV SOLN
INTRAVENOUS | Status: DC | PRN
  Administered 2015-11-11: 4 mg via INTRAVENOUS

## 2015-11-11 MED ORDER — LIDOCAINE-EPINEPHRINE 1 %-1:100000 IJ SOLN
INTRAMUSCULAR | Status: DC | PRN
  Administered 2015-11-11: 10 mL

## 2015-11-11 MED ORDER — DIAZEPAM 5 MG PO TABS: 10.0000 mg | ORAL_TABLET | Freq: Three times a day (TID) | ORAL | Status: DC | PRN

## 2015-11-11 MED ORDER — EPHEDRINE SULFATE 50 MG/ML IJ SOLN
INTRAMUSCULAR | Status: DC | PRN
  Administered 2015-11-11: 15 mg via INTRAVENOUS
  Administered 2015-11-11: 25 mg via INTRAVENOUS
  Administered 2015-11-11: 10 mg via INTRAVENOUS

## 2015-11-11 MED ORDER — FINASTERIDE 5 MG PO TABS
5.0000 mg | ORAL_TABLET | ORAL | Status: DC
  Filled 2015-11-11: qty 1

## 2015-11-11 MED ORDER — PROPOFOL 10 MG/ML IV BOLUS
INTRAVENOUS | Status: AC
  Filled 2015-11-11: qty 40

## 2015-11-11 MED ORDER — SUCCINYLCHOLINE CHLORIDE 20 MG/ML IJ SOLN
INTRAMUSCULAR | Status: AC
  Filled 2015-11-11: qty 1

## 2015-11-11 MED ORDER — SODIUM CHLORIDE 0.9% FLUSH: 3.0000 mL | Freq: Two times a day (BID) | INTRAVENOUS | Status: DC

## 2015-11-11 MED ORDER — FENTANYL CITRATE (PF) 100 MCG/2ML IJ SOLN
INTRAMUSCULAR | Status: DC | PRN
  Administered 2015-11-11 (×2): 50 ug via INTRAVENOUS

## 2015-11-11 MED ORDER — BACITRACIN 50000 UNITS IM SOLR
INTRAMUSCULAR | Status: DC | PRN
  Administered 2015-11-11: 500 mL

## 2015-11-11 MED ORDER — GLYCOPYRROLATE 0.2 MG/ML IJ SOLN
INTRAMUSCULAR | Status: DC | PRN
  Administered 2015-11-11: 0.2 mg via INTRAVENOUS
  Administered 2015-11-11: 0.6 mg via INTRAVENOUS
  Administered 2015-11-11: 0.2 mg via INTRAVENOUS

## 2015-11-11 MED ORDER — TIMOLOL MALEATE 0.5 % OP SOLN
1.0000 [drp] | OPHTHALMIC | Status: DC
  Filled 2015-11-11: qty 5

## 2015-11-11 MED ORDER — BUPROPION HCL ER (SR) 150 MG PO TB12
150.0000 mg | ORAL_TABLET | Freq: Two times a day (BID) | ORAL | Status: DC
  Administered 2015-11-11 (×2): 150 mg via ORAL
  Filled 2015-11-11 (×2): qty 1

## 2015-11-11 MED ORDER — TAMSULOSIN HCL 0.4 MG PO CAPS
0.8000 mg | ORAL_CAPSULE | ORAL | Status: DC
  Administered 2015-11-11 – 2015-11-12 (×2): 0.8 mg via ORAL
  Filled 2015-11-11: qty 2

## 2015-11-11 MED ORDER — ROCURONIUM BROMIDE 100 MG/10ML IV SOLN
INTRAVENOUS | Status: DC | PRN
  Administered 2015-11-11: 30 mg via INTRAVENOUS

## 2015-11-11 MED ORDER — PHENOL 1.4 % MT LIQD: 1.0000 | OROMUCOSAL | Status: DC | PRN

## 2015-11-11 MED ORDER — OXYCODONE-ACETAMINOPHEN 5-325 MG PO TABS: 1.0000 | ORAL_TABLET | ORAL | Status: DC | PRN

## 2015-11-11 MED ORDER — VITAMIN C 500 MG PO TABS
500.0000 mg | ORAL_TABLET | Freq: Every day | ORAL | Status: DC
  Filled 2015-11-11 (×2): qty 1

## 2015-11-11 MED ORDER — FENTANYL CITRATE (PF) 100 MCG/2ML IJ SOLN
INTRAMUSCULAR | Status: AC
  Filled 2015-11-11: qty 2

## 2015-11-11 MED ORDER — OMEGA-3-ACID ETHYL ESTERS 1 G PO CAPS
1.0000 g | ORAL_CAPSULE | Freq: Two times a day (BID) | ORAL | Status: DC
  Administered 2015-11-11 (×2): 1 g via ORAL
  Filled 2015-11-11 (×4): qty 1

## 2015-11-11 MED ORDER — LACTATED RINGERS IV SOLN
INTRAVENOUS | Status: DC | PRN
  Administered 2015-11-11 (×2): via INTRAVENOUS

## 2015-11-11 MED ORDER — HEMOSTATIC AGENTS (NO CHARGE) OPTIME
TOPICAL | Status: DC | PRN
  Administered 2015-11-11: 1 via TOPICAL

## 2015-11-11 MED ORDER — ROCURONIUM BROMIDE 50 MG/5ML IV SOLN
INTRAVENOUS | Status: AC
  Filled 2015-11-11: qty 1

## 2015-11-11 MED ORDER — SUCCINYLCHOLINE CHLORIDE 20 MG/ML IJ SOLN
INTRAMUSCULAR | Status: DC | PRN
  Administered 2015-11-11: 100 mg via INTRAVENOUS

## 2015-11-11 MED ORDER — PHENYLEPHRINE HCL 10 MG/ML IJ SOLN
INTRAMUSCULAR | Status: AC
  Filled 2015-11-11: qty 1

## 2015-11-11 MED ORDER — ACETAMINOPHEN 650 MG RE SUPP: 650.0000 mg | RECTAL | Status: DC | PRN

## 2015-11-11 MED ORDER — FINASTERIDE 5 MG PO TABS
5.0000 mg | ORAL_TABLET | ORAL | Status: DC
  Filled 2015-11-11 (×2): qty 1

## 2015-11-11 MED ORDER — LIDOCAINE HCL (CARDIAC) 20 MG/ML IV SOLN
INTRAVENOUS | Status: AC
  Filled 2015-11-11: qty 5

## 2015-11-11 MED ORDER — PHENYLEPHRINE HCL 10 MG/ML IJ SOLN
INTRAMUSCULAR | Status: DC | PRN
  Administered 2015-11-11 (×2): 80 ug via INTRAVENOUS

## 2015-11-11 MED ORDER — VITAMIN D (ERGOCALCIFEROL) 1.25 MG (50000 UNIT) PO CAPS
50000.0000 [IU] | ORAL_CAPSULE | ORAL | Status: DC
  Filled 2015-11-11: qty 1

## 2015-11-11 MED ORDER — SODIUM CHLORIDE 0.9 % IJ SOLN
INTRAMUSCULAR | Status: AC
  Filled 2015-11-11: qty 10

## 2015-11-11 MED ORDER — FINASTERIDE 5 MG PO TABS
5.0000 mg | ORAL_TABLET | ORAL | Status: DC
  Administered 2015-11-12: 5 mg via ORAL
  Filled 2015-11-11 (×2): qty 1

## 2015-11-11 MED ORDER — HYDROCODONE-ACETAMINOPHEN 5-325 MG PO TABS
1.0000 | ORAL_TABLET | Freq: Three times a day (TID) | ORAL | Status: DC | PRN
  Administered 2015-11-11 (×2): 1 via ORAL
  Filled 2015-11-11 (×2): qty 1

## 2015-11-11 MED ORDER — EPHEDRINE SULFATE 50 MG/ML IJ SOLN
INTRAMUSCULAR | Status: AC
  Filled 2015-11-11: qty 1

## 2015-11-11 MED ORDER — NIACIN ER 500 MG PO CPCR
500.0000 mg | ORAL_CAPSULE | Freq: Every day | ORAL | Status: DC
  Administered 2015-11-11: 500 mg via ORAL
  Filled 2015-11-11 (×2): qty 1

## 2015-11-11 MED ORDER — ACETAMINOPHEN 325 MG PO TABS: 650.0000 mg | ORAL_TABLET | ORAL | Status: DC | PRN

## 2015-11-11 MED ORDER — FENTANYL CITRATE (PF) 100 MCG/2ML IJ SOLN
25.0000 ug | INTRAMUSCULAR | Status: DC | PRN
  Administered 2015-11-11: 25 ug via INTRAVENOUS

## 2015-11-11 MED ORDER — ADULT MULTIVITAMIN W/MINERALS CH
1.0000 | ORAL_TABLET | Freq: Every day | ORAL | Status: DC
  Filled 2015-11-11: qty 1

## 2015-11-11 MED ORDER — SODIUM CHLORIDE 0.9 % IV SOLN: 250.0000 mL | INTRAVENOUS | Status: DC

## 2015-11-11 MED ORDER — ONDANSETRON HCL 4 MG/2ML IJ SOLN: 4.0000 mg | INTRAMUSCULAR | Status: DC | PRN

## 2015-11-11 MED ORDER — ONDANSETRON HCL 4 MG/2ML IJ SOLN: 4.0000 mg | Freq: Once | INTRAMUSCULAR | Status: DC | PRN

## 2015-11-11 MED ORDER — VANCOMYCIN HCL IN DEXTROSE 1-5 GM/200ML-% IV SOLN
1000.0000 mg | Freq: Once | INTRAVENOUS | Status: AC
  Administered 2015-11-11: 1000 mg via INTRAVENOUS
  Filled 2015-11-11: qty 200

## 2015-11-11 MED ORDER — ONDANSETRON HCL 4 MG/2ML IJ SOLN
INTRAMUSCULAR | Status: AC
  Filled 2015-11-11: qty 2

## 2015-11-11 MED ORDER — BRIMONIDINE TARTRATE-TIMOLOL 0.2-0.5 % OP SOLN: 1.0000 [drp] | Freq: Two times a day (BID) | OPHTHALMIC | Status: DC

## 2015-11-11 MED ORDER — THROMBIN 5000 UNITS EX SOLR
CUTANEOUS | Status: DC | PRN
  Administered 2015-11-11 (×2): 5000 [IU] via TOPICAL

## 2015-11-11 MED ORDER — BUPIVACAINE HCL (PF) 0.25 % IJ SOLN
INTRAMUSCULAR | Status: DC | PRN
  Administered 2015-11-11: 10 mL

## 2015-11-11 MED ORDER — TAMSULOSIN HCL 0.4 MG PO CAPS
0.8000 mg | ORAL_CAPSULE | ORAL | Status: DC
  Filled 2015-11-11: qty 2

## 2015-11-11 MED ORDER — HYDROMORPHONE HCL 1 MG/ML IJ SOLN: 0.5000 mg | INTRAMUSCULAR | Status: DC | PRN

## 2015-11-11 MED ORDER — FENTANYL CITRATE (PF) 250 MCG/5ML IJ SOLN
INTRAMUSCULAR | Status: AC
  Filled 2015-11-11: qty 5

## 2015-11-11 MED ORDER — 0.9 % SODIUM CHLORIDE (POUR BTL) OPTIME
TOPICAL | Status: DC | PRN
  Administered 2015-11-11: 1000 mL

## 2015-11-11 MED ORDER — DOXEPIN HCL 75 MG PO CAPS
75.0000 mg | ORAL_CAPSULE | Freq: Every day | ORAL | Status: DC
  Administered 2015-11-11: 75 mg via ORAL
  Filled 2015-11-11 (×2): qty 1

## 2015-11-11 MED ORDER — ONDANSETRON HCL 4 MG/2ML IJ SOLN
INTRAMUSCULAR | Status: DC | PRN
  Administered 2015-11-11: 4 mg via INTRAVENOUS

## 2015-11-11 MED ORDER — PROPOFOL 10 MG/ML IV BOLUS
INTRAVENOUS | Status: DC | PRN
  Administered 2015-11-11: 120 mg via INTRAVENOUS

## 2015-11-11 MED ORDER — IRBESARTAN 75 MG PO TABS
37.5000 mg | ORAL_TABLET | Freq: Every day | ORAL | Status: DC
  Administered 2015-11-11: 37.5 mg via ORAL
  Filled 2015-11-11 (×2): qty 0.5

## 2015-11-11 MED ORDER — CYCLOBENZAPRINE HCL 10 MG PO TABS: 10.0000 mg | ORAL_TABLET | Freq: Three times a day (TID) | ORAL | Status: DC | PRN

## 2015-11-11 MED ORDER — MUPIROCIN 2 % EX OINT
1.0000 "application " | TOPICAL_OINTMENT | Freq: Two times a day (BID) | CUTANEOUS | Status: DC
  Administered 2015-11-11: 1 via NASAL
  Filled 2015-11-11: qty 22

## 2015-11-11 MED ORDER — NEOSTIGMINE METHYLSULFATE 10 MG/10ML IV SOLN
INTRAVENOUS | Status: AC
  Filled 2015-11-11: qty 1

## 2015-11-11 MED ORDER — LIDOCAINE HCL (CARDIAC) 20 MG/ML IV SOLN
INTRAVENOUS | Status: DC | PRN
  Administered 2015-11-11: 80 mg via INTRAVENOUS

## 2015-11-11 MED ORDER — AMLODIPINE BESYLATE 5 MG PO TABS
5.0000 mg | ORAL_TABLET | Freq: Every day | ORAL | Status: DC
Start: 2015-11-12 — End: 2015-11-12
  Filled 2015-11-11: qty 1

## 2015-11-11 MED ORDER — HYDROCODONE-ACETAMINOPHEN 5-325 MG PO TABS
1.0000 | ORAL_TABLET | ORAL | Status: DC | PRN
  Administered 2015-11-12 (×2): 1 via ORAL
  Filled 2015-11-11 (×2): qty 1

## 2015-11-11 MED ORDER — SODIUM CHLORIDE 0.9% FLUSH: 3.0000 mL | INTRAVENOUS | Status: DC | PRN

## 2015-11-11 SURGICAL SUPPLY — 51 items
APL SKNCLS STERI-STRIP NONHPOA (GAUZE/BANDAGES/DRESSINGS) ×1
BAG DECANTER FOR FLEXI CONT (MISCELLANEOUS) ×2 IMPLANT
BENZOIN TINCTURE PRP APPL 2/3 (GAUZE/BANDAGES/DRESSINGS) ×2 IMPLANT
BLADE CLIPPER SURG (BLADE) IMPLANT
BLADE SURG 11 STRL SS (BLADE) ×2 IMPLANT
BRUSH SCRUB EZ PLAIN DRY (MISCELLANEOUS) ×2 IMPLANT
BUR MATCHSTICK NEURO 3.0 LAGG (BURR) ×2 IMPLANT
BUR PRECISION FLUTE 6.0 (BURR) ×2 IMPLANT
CANISTER SUCT 3000ML PPV (MISCELLANEOUS) ×2 IMPLANT
DECANTER SPIKE VIAL GLASS SM (MISCELLANEOUS) ×2 IMPLANT
DRAPE LAPAROTOMY 100X72X124 (DRAPES) ×2 IMPLANT
DRAPE MICROSCOPE LEICA (MISCELLANEOUS) ×2 IMPLANT
DRAPE POUCH INSTRU U-SHP 10X18 (DRAPES) ×2 IMPLANT
DRAPE PROXIMA HALF (DRAPES) IMPLANT
DRAPE SURG 17X23 STRL (DRAPES) ×2 IMPLANT
DRSG OPSITE POSTOP 4X6 (GAUZE/BANDAGES/DRESSINGS) ×1 IMPLANT
DURAPREP 26ML APPLICATOR (WOUND CARE) ×2 IMPLANT
ELECT REM PT RETURN 9FT ADLT (ELECTROSURGICAL) ×2
ELECTRODE REM PT RTRN 9FT ADLT (ELECTROSURGICAL) ×1 IMPLANT
GAUZE SPONGE 4X4 12PLY STRL (GAUZE/BANDAGES/DRESSINGS) ×2 IMPLANT
GAUZE SPONGE 4X4 16PLY XRAY LF (GAUZE/BANDAGES/DRESSINGS) IMPLANT
GLOVE BIO SURGEON STRL SZ8 (GLOVE) ×2 IMPLANT
GLOVE EXAM NITRILE LRG STRL (GLOVE) IMPLANT
GLOVE EXAM NITRILE MD LF STRL (GLOVE) IMPLANT
GLOVE EXAM NITRILE XL STR (GLOVE) IMPLANT
GLOVE EXAM NITRILE XS STR PU (GLOVE) IMPLANT
GLOVE INDICATOR 8.5 STRL (GLOVE) ×2 IMPLANT
GOWN STRL REUS W/ TWL LRG LVL3 (GOWN DISPOSABLE) ×1 IMPLANT
GOWN STRL REUS W/ TWL XL LVL3 (GOWN DISPOSABLE) ×2 IMPLANT
GOWN STRL REUS W/TWL 2XL LVL3 (GOWN DISPOSABLE) IMPLANT
GOWN STRL REUS W/TWL LRG LVL3 (GOWN DISPOSABLE) ×2
GOWN STRL REUS W/TWL XL LVL3 (GOWN DISPOSABLE) ×4
KIT BASIN OR (CUSTOM PROCEDURE TRAY) ×2 IMPLANT
KIT ROOM TURNOVER OR (KITS) ×2 IMPLANT
LIQUID BAND (GAUZE/BANDAGES/DRESSINGS) ×2 IMPLANT
NDL SPNL 22GX3.5 QUINCKE BK (NEEDLE) ×1 IMPLANT
NEEDLE HYPO 22GX1.5 SAFETY (NEEDLE) ×2 IMPLANT
NEEDLE SPNL 22GX3.5 QUINCKE BK (NEEDLE) ×2 IMPLANT
NS IRRIG 1000ML POUR BTL (IV SOLUTION) ×2 IMPLANT
PACK LAMINECTOMY NEURO (CUSTOM PROCEDURE TRAY) ×2 IMPLANT
RUBBERBAND STERILE (MISCELLANEOUS) ×4 IMPLANT
SPONGE SURGIFOAM ABS GEL SZ50 (HEMOSTASIS) ×2 IMPLANT
STRIP CLOSURE SKIN 1/2X4 (GAUZE/BANDAGES/DRESSINGS) ×2 IMPLANT
SUT VIC AB 0 CT1 18XCR BRD8 (SUTURE) ×1 IMPLANT
SUT VIC AB 0 CT1 8-18 (SUTURE) ×2
SUT VIC AB 2-0 CT1 18 (SUTURE) ×2 IMPLANT
SUT VICRYL 4-0 PS2 18IN ABS (SUTURE) ×2 IMPLANT
TAPE STRIPS DRAPE STRL (GAUZE/BANDAGES/DRESSINGS) ×1 IMPLANT
TOWEL OR 17X24 6PK STRL BLUE (TOWEL DISPOSABLE) ×2 IMPLANT
TOWEL OR 17X26 10 PK STRL BLUE (TOWEL DISPOSABLE) ×2 IMPLANT
WATER STERILE IRR 1000ML POUR (IV SOLUTION) ×2 IMPLANT

## 2015-11-11 NOTE — Progress Notes (Signed)
ANTIBIOTIC CONSULT NOTE - INITIAL  Pharmacy Consult for Vancomycin Indication:  Surgical prophylaxis  Allergies  Allergen Reactions  . Penicillins Anaphylaxis and Hives    Has patient had a PCN reaction causing immediate rash, facial/tongue/throat swelling, SOB or lightheadedness with hypotension: Yes Has patient had a PCN reaction causing severe rash involving mucus membranes or skin necrosis: No Has patient had a PCN reaction that required hospitalization Yes Has patient had a PCN reaction occurring within the last 10 years: No If all of the above answers are "NO", then may proceed with Cephalosporin use.   . Simvastatin Other (See Comments)    Leg weakness    Patient Measurements: Weight: 212 lb (96.163 kg) Height: 6 ft 1 in.  Vital Signs: Temp: 97.2 F (36.2 C) (02/13 1015) BP: 147/76 mmHg (02/13 1015) Pulse Rate: 69 (02/13 1015) Intake/Output from previous day:   Intake/Output from this shift: Total I/O In: 1050 [I.V.:1050] Out: 1000 [Urine:950; Blood:50]  Labs:  Recent Labs  11/08/15 1604  WBC 7.9  HGB 14.3  PLT 267  CREATININE 0.91   Estimated Creatinine Clearance: 76.5 mL/min (by C-G formula based on Cr of 0.91). No results for input(s): VANCOTROUGH, VANCOPEAK, VANCORANDOM, GENTTROUGH, GENTPEAK, GENTRANDOM, TOBRATROUGH, TOBRAPEAK, TOBRARND, AMIKACINPEAK, AMIKACINTROU, AMIKACIN in the last 72 hours.   Microbiology: Recent Results (from the past 720 hour(s))  Surgical pcr screen     Status: Abnormal   Collection Time: 11/08/15  4:03 PM  Result Value Ref Range Status   MRSA, PCR NEGATIVE NEGATIVE Final   Staphylococcus aureus POSITIVE (A) NEGATIVE Final    Comment:        The Xpert SA Assay (FDA approved for NASAL specimens in patients over 27 years of age), is one component of a comprehensive surveillance program.  Test performance has been validated by [facility] for patients greater than or equal to 84 year old. It is not intended to diagnose  infection nor to guide or monitor treatment.     Medical History: Past Medical History  Diagnosis Date  . Osteoarthritis   . Hypogonadism male   . BPH (benign prostatic hypertrophy) with urinary obstruction   . Hypertension   . Osteopenia   . Vitamin D deficiency   . Anxiety   . Depression   . Diverticulosis   . Colon polyps   . Hypercholesterolemia   . Glaucoma   . Sleep apnea   . Alcohol abuse     Remote hx, stopped in 1970  . OSA on CPAP   . Headache     sinus headaches    Medications:  Scheduled:  . [START ON 11/12/2015] amLODipine  5 mg Oral Daily  . [START ON 11/12/2015] aspirin  650 mg Oral Daily  . buPROPion  150 mg Oral BID  . doxepin  75 mg Oral QHS  . fentaNYL      . finasteride  5 mg Oral BH-q7a  . irbesartan  37.5 mg Oral Daily  . multivitamin with minerals  1 tablet Oral Daily  . mupirocin ointment  1 application Nasal BID  . niacin  500 mg Oral Daily  . omega-3 acid ethyl esters  1 g Oral BID  . sodium chloride flush  3 mL Intravenous Q12H  . [START ON 11/12/2015] tamsulosin  0.8 mg Oral BH-q7a  . [START ON 11/12/2015] timolol  1 drop Both Eyes BH-q7a  . vitamin C  500 mg Oral Daily  . Vitamin D (Ergocalciferol)  50,000 Units Oral Weekly   Assessment:  82 y.o male s/p Bilateral decompressive lumbar laminectomy at L4-5. He has a h/o of PCN allergy. He received preop vancomycin 1500 mg IV today @ 07:10 this AM. SCr 0.91, estimated CrCl ~ 70 ml/min No drain.  Patient to receive 1 dose of IV vancomycin post op for surgical prophylaxis.   Goal of Therapy:  Vancomycin trough level 10-15 mcg/ml  Plan:  Give vancomycin 1gm IV  x1 dose at 20:00 tonight. Pharmacy will sign off.   Nicole Cella, RPh Clinical Pharmacist Pager: [phone] 11/11/2015,12:48 PM

## 2015-11-11 NOTE — Anesthesia Procedure Notes (Signed)
Procedure Name: Intubation Date/Time: 11/11/2015 7:42 AM Performed by: Scheryl Darter Pre-anesthesia Checklist: Patient identified, Emergency Drugs available, Suction available, Patient being monitored and Timeout performed Patient Re-evaluated:Patient Re-evaluated prior to inductionOxygen Delivery Method: Circle system utilized Preoxygenation: Pre-oxygenation with 100% oxygen Intubation Type: IV induction Ventilation: Mask ventilation without difficulty Laryngoscope Size: Miller and 3 Grade View: Grade I Tube type: Oral Tube size: 7.5 mm Number of attempts: 1 Airway Equipment and Method: Stylet Placement Confirmation: ETT inserted through vocal cords under direct vision,  positive ETCO2 and breath sounds checked- equal and bilateral Secured at: 23 cm Tube secured with: Tape Dental Injury: Teeth and Oropharynx as per pre-operative assessment

## 2015-11-11 NOTE — Transfer of Care (Signed)
2Immediate Anesthesia Transfer of Care Note  Patient: Lance [name]  Procedure(s) Performed: Procedure(s) with comments: Bilateral L4-5 Laminectomy (Bilateral) - Bilateral L4-5 Laminectomy  Patient Location: PACU  Anesthesia Type:General  Level of Consciousness: awake, alert , oriented and sedated  Airway & Oxygen Therapy: Patient Spontanous Breathing and Patient connected to nasal cannula oxygen  Post-op Assessment: Report given to RN, Post -op Vital signs reviewed and stable and Patient moving all extremities  Post vital signs: Reviewed and stable  Last Vitals:  Filed Vitals:   11/11/15 0606  BP: 106/51  Pulse: 72  Temp: 36.4 C  Resp: 71    Complications: No apparent anesthesia complications

## 2015-11-11 NOTE — Op Note (Signed)
Preoperative diagnosis: Lumbar spinal stenosis L4-5 with bilateral L5 radiculopathies  Postoperative diagnosis: Same  Procedure: Bilateral decompressive lumbar laminectomy at L4-5 with foraminotomies of the L4 and L5 nerve roots.  Surgeon: Dominica Severin Glendy Barsanti  CC: Gen.  EBL: Minimal  History of present illness: Patient is a very pleasant 82 year old gentleman is a progress worsening back and bilateral leg pain radiating down L5 nerve root pattern. Workup revealed severe degenerative disc disease lumbar spinal stenosis predominantly from facet arthropathy and foraminal stenosis at L5. Due to patient's failure conservative treatment imaging findings and progression of clinical syndrome or recommended decompression at L4-5 extensively went over the risks and benefits of the operation with the patient as well as perioperative course expectations of outcome and alternatives of surgery and he understands and agrees to proceed forward.  Operative procedure: Patient brought into the or was induced under general anesthesia positioned prone the Wilson frame his back was prepped and draped in routine sterile fashion preoperative x-ray localized the appropriate level so after infiltration of 10 mL lidocaine with epi a midline incision was made and Bovie light cautery was used to calcification subperiosteal dissections care lamina of L4 and L5 bilaterally. Spinous process was then removed at L4 centers decompression was begun with a 3 mm Kerrison punch then Truman Hayward with her was identified and removed in piecemeal fashion. There was marked facet arthropathy and large spurs coming off the medial aspect of facet joint displacing the proximal L5 nerve root. After marching down the medial gutter and identify the L4 pedicle and decompress the L4 foramen marching further laterally the large spurs coming off the facet displacing the 5 root were also removed decompressing the L5 neuroforamen bilaterally. At the end of decompression was  no further stenosis either centrally or foraminally the wound scope was irrigated meticulous in space was maintained Gelfoam was overlaid top of the dura and the wounds closed in layers with interrupted Vicryl the skin was closed running 4 subcuticular or bun benzoin and Steri-Strips were applied and patient recovered in stable condition. At the end the case all needle counts sponge counts were correct.

## 2015-11-11 NOTE — Anesthesia Postprocedure Evaluation (Signed)
Anesthesia Post Note  Patient: Savage Artrip  Procedure(s) Performed: Procedure(s) (LRB): Bilateral L4-5 Laminectomy (Bilateral)  Patient location during evaluation: PACU Anesthesia Type: General Level of consciousness: awake and alert Pain management: pain level controlled Vital Signs Assessment: post-procedure vital signs reviewed and stable Respiratory status: spontaneous breathing, nonlabored ventilation, respiratory function stable and patient connected to nasal cannula oxygen Cardiovascular status: blood pressure returned to baseline and stable Postop Assessment: no signs of nausea or vomiting Anesthetic complications: no    Last Vitals:  Filed Vitals:   11/11/15 0947 11/11/15 1015  BP:  147/76  Pulse: 68 69  Temp: 36.2 C 36.2 C  Resp: 21 20    Last Pain:  Filed Vitals:   11/11/15 1424  PainSc: 3                  Keithen Capo JENNETTE

## 2015-11-11 NOTE — Evaluation (Signed)
Physical Therapy Evaluation Patient Details Name: Lance [name] MRN: [id number] DOB: 1932/07/12 Today's Date: 11/11/2015   History of Present Illness  Pt is an 82 y/o male who presents s/p L4-L5 lumbar laminectomy and decompression on 11/11/15.   Clinical Impression  Pt admitted with above diagnosis. Pt currently with functional limitations due to the deficits listed below (see PT Problem List). At the time of PT eval pt was able to perform transfers and ambulation with min guard assist for safety. Pt with decreased recall of precautions and has overall poor awareness of safety when OOB. Wife present during education and appears invested in assisting pt to maintain precautions. Pt will benefit from skilled PT to increase their independence and safety with mobility to allow discharge to the venue listed below.       Follow Up Recommendations Outpatient PT;Supervision for mobility/OOB    Equipment Recommendations  None recommended by PT    Recommendations for Other Services       Precautions / Restrictions Precautions Precautions: Fall;Back Precaution Booklet Issued: Yes (comment) Precaution Comments: Reviewed handout and pt was cued for precautions during functional mobility.  Restrictions Weight Bearing Restrictions: No      Mobility  Bed Mobility Overal bed mobility: Needs Assistance Bed Mobility: Rolling;Sidelying to Sit Rolling: Supervision Sidelying to sit: Supervision       General bed mobility comments: Step-by-step VC's for proper log roll technique required.   Transfers Overall transfer level: Needs assistance Equipment used: Rolling walker (2 wheeled) Transfers: Sit to/from Stand Sit to Stand: Min guard         General transfer comment: VC's for hand placement on seated surface for safety.   Ambulation/Gait Ambulation/Gait assistance: Min guard Ambulation Distance (Feet): 100 Feet Assistive device: Rolling walker (2 wheeled) Gait  Pattern/deviations: Step-through pattern;Decreased stride length;Trunk flexed Gait velocity: Decreased Gait velocity interpretation: Below normal speed for age/gender General Gait Details: VC's for improved posture and maintenance of back precautions.   Stairs Stairs: Yes Stairs assistance: Min guard Stair Management: Two rails;Step to pattern;Forwards Number of Stairs: 3 General stair comments: VC's for sequencing and safety. Pt with difficulty maintaining precautions and required increased cues for safety.   Wheelchair Mobility    Modified Rankin (Stroke Patients Only)       Balance Overall balance assessment: Needs assistance Sitting-balance support: No upper extremity supported;Feet supported Sitting balance-Leahy Scale: Fair     Standing balance support: Bilateral upper extremity supported;During functional activity Standing balance-Leahy Scale: Poor                               Pertinent Vitals/Pain Pain Assessment: Faces Faces Pain Scale: Hurts a little bit Pain Location: Back Pain Descriptors / Indicators: Operative site guarding Pain Intervention(s): Limited activity within patient's tolerance;Monitored during session;Repositioned    Home Living Family/patient expects to be discharged to:: Private residence Living Arrangements: Spouse/significant other Available Help at Discharge: Family;Available 24 hours/day Type of Home: House Home Access: Stairs to enter Entrance Stairs-Rails: Right;Left;Can reach both Entrance Stairs-Number of Steps: 3 Home Layout: Two level Home Equipment: Walker - 2 wheels;Cane - single point;Shower seat - built in;Hand held shower head;Toilet riser;Grab bars - toilet;Grab bars - tub/shower      Prior Function Level of Independence: Independent with assistive device(s)         Comments: Used cane and then progressed to walker as pain increased prior to surgery     Hand Dominance  Extremity/Trunk  Assessment   Upper Extremity Assessment: Defer to OT evaluation           Lower Extremity Assessment: Generalized weakness      Cervical / Trunk Assessment: Other exceptions  Communication   Communication: HOH  Cognition Arousal/Alertness: Awake/alert Behavior During Therapy: WFL for tasks assessed/performed Overall Cognitive Status: Within Functional Limits for tasks assessed       Memory: Decreased recall of precautions              General Comments      Exercises        Assessment/Plan    PT Assessment Patient needs continued PT services  PT Diagnosis Difficulty walking;Acute pain   PT Problem List Decreased strength;Decreased range of motion;Decreased activity tolerance;Decreased balance;Decreased mobility;Decreased knowledge of use of DME;Decreased safety awareness;Decreased knowledge of precautions;Pain  PT Treatment Interventions DME instruction;Gait training;Stair training;Functional mobility training;Therapeutic activities;Therapeutic exercise;Neuromuscular re-education;Patient/family education   PT Goals (Current goals can be found in the Care Plan section) Acute Rehab PT Goals Patient Stated Goal: Home at d/c PT Goal Formulation: With patient/family Time For Goal Achievement: 11/18/15 Potential to Achieve Goals: Good    Frequency Min 5X/week   Barriers to discharge        Co-evaluation               End of Session Equipment Utilized During Treatment: Gait belt Activity Tolerance: Patient tolerated treatment well Patient left: in chair;with call bell/phone within reach;with family/visitor present Nurse Communication: Mobility status    Functional Assessment Tool Used: Clinical judgement Functional Limitation: Mobility: Walking and moving around Mobility: Walking and Moving Around Current Status [id number]): At least 1 percent but less than 20 percent impaired, limited or restricted Mobility: Walking and Moving Around Goal Status [phone]):  At least 1 percent but less than 20 percent impaired, limited or restricted    Time: 1415-1442 PT Time Calculation (min) (ACUTE ONLY): 27 min   Charges:   PT Evaluation $PT Eval Moderate Complexity: 1 Procedure PT Treatments $Gait Training: 8-22 mins   PT G Codes:   PT G-Codes **NOT FOR INPATIENT CLASS** Functional Assessment Tool Used: Clinical judgement Functional Limitation: Mobility: Walking and moving around Mobility: Walking and Moving Around Current Status [id number]): At least 1 percent but less than 20 percent impaired, limited or restricted Mobility: Walking and Moving Around Goal Status [phone]): At least 1 percent but less than 20 percent impaired, limited or restricted    Rolinda Roan 11/11/2015, 3:02 PM   Rolinda Roan, PT, DPT Acute Rehabilitation Services Pager: [phone]

## 2015-11-11 NOTE — H&P (Signed)
Lance [name] is an 82 y.o. male.   Chief Complaint: Back and bilateral leg pain HPI: Patient is a very pleasant 82 year old gentleman is a progress worsening predominantly leg pain and neurogenic claudication has had little bit of back pain with that workup has revealed spinal stenosis lateral recess stenosis with compression of both L4 and L5 nerve roots bilaterally. Due to patient's failure conservative treatment imaging findings and progression of clinical syndrome I recommended decompressive laminectomy at L4-5. I've extensively gone over the risks and benefits of the operation with the patient as well as perioperative course expectations of outcome and alternatives of surgery he understands and agrees to proceed forward.  Past Medical History  Diagnosis Date  . Osteoarthritis   . Hypogonadism male   . BPH (benign prostatic hypertrophy) with urinary obstruction   . Hypertension   . Osteopenia   . Vitamin D deficiency   . Anxiety   . Depression   . Diverticulosis   . Colon polyps   . Hypercholesterolemia   . Glaucoma   . Sleep apnea   . Alcohol abuse     Remote hx, stopped in 1970  . OSA on CPAP   . Headache     sinus headaches    Past Surgical History  Procedure Laterality Date  . Tonsillectomy    . Appendectomy  1945  . Shoulder surgery      rt  . Eye surgery      bilateral cataracts    History reviewed. No pertinent family history. Social History:  reports that he quit smoking about 53 years ago. He has never used smokeless tobacco. He reports that he does not drink alcohol or use illicit drugs.  Allergies:  Allergies  Allergen Reactions  . Penicillins Anaphylaxis and Hives    Has patient had a PCN reaction causing immediate rash, facial/tongue/throat swelling, SOB or lightheadedness with hypotension: Yes Has patient had a PCN reaction causing severe rash involving mucus membranes or skin necrosis: No Has patient had a PCN reaction that required  hospitalization Yes Has patient had a PCN reaction occurring within the last 10 years: No If all of the above answers are "NO", then may proceed with Cephalosporin use.   . Simvastatin Other (See Comments)    Leg weakness    Medications Prior to Admission  Medication Sig Dispense Refill  . amLODipine (NORVASC) 5 MG tablet Take 5 mg by mouth daily.  8  . aspirin 325 MG tablet Take 650 mg by mouth daily.    . Brimonidine Tartrate-Timolol (COMBIGAN OP) Place 1 drop into both eyes 2 (two) times daily.     Marland Kitchen buPROPion (WELLBUTRIN SR) 150 MG 12 hr tablet Take 150 mg by mouth 2 (two) times daily.    . [facility] NIACIN FLUSH FREE 400-100 MG CAPS Take 1 tablet by mouth daily.  5  . diazepam (VALIUM) 10 MG tablet Take 10 mg by mouth every 8 (eight) hours as needed for anxiety.     Marland Kitchen doxepin (SINEQUAN) 75 MG capsule Take 1 capsule (75 mg total) by mouth at bedtime. 30 capsule 0  . ergocalciferol (VITAMIN D2) 50000 UNITS capsule Take 50,000 Units by mouth once a week. On monday    . finasteride (PROSCAR) 5 MG tablet Take 5 mg by mouth every morning.     Marland Kitchen HYDROcodone-acetaminophen (NORCO/VICODIN) 5-325 MG tablet Take 1 tablet by mouth 3 (three) times daily as needed for moderate pain.   0  . Multiple Vitamin (MULTIVITAMIN) capsule Take 1  capsule by mouth daily.     . OMEGA 3 1000 MG CAPS Take 1 capsule by mouth 3 (three) times daily.    . Tamsulosin HCl (FLOMAX) 0.4 MG CAPS Take 0.8 mg by mouth every morning.    . Timolol Maleate PF 0.5 % SOLN Place 1 drop into both eyes every morning.    . valsartan (DIOVAN) 320 MG tablet Take 320 mg by mouth daily.  11  . vitamin C (ASCORBIC ACID) 500 MG tablet Take 500 mg by mouth daily.      No results found for this or any previous visit (from the past 48 hour(s)). No results found.  Review of Systems  Constitutional: Negative.   HENT: Negative.   Eyes: Negative.   Respiratory: Negative.   Cardiovascular: Negative.   Gastrointestinal: Negative.    Genitourinary: Negative.   Musculoskeletal: Positive for myalgias and back pain.  Skin: Negative.   Neurological: Negative.   Psychiatric/Behavioral: Negative.     Blood pressure 106/51, pulse 72, temperature 97.6 F (36.4 C), resp. rate 71, weight 96.163 kg (212 lb), SpO2 93 %. Physical Exam  Constitutional: He is oriented to person, place, and time. He appears well-developed and well-nourished.  HENT:  Head: Normocephalic.  Eyes: Pupils are equal, round, and reactive to light.  Neck: Normal range of motion.  Respiratory: Effort normal.  GI: Soft.  Neurological: He is alert and oriented to person, place, and time. He has normal strength. GCS eye subscore is 4. GCS verbal subscore is 5. GCS motor subscore is 6.  5 in his iliopsoas, quads, hamstrings, gastric, and tibialis, and EHL.  Skin: Skin is warm and dry.     Assessment/Plan 82 yo presents for an L4-5 decompressive laminectomy.  Randolf Sansoucie P, MD 11/11/2015, 7:19 AM

## 2015-11-12 ENCOUNTER — Encounter (HOSPITAL_COMMUNITY): Payer: Self-pay | Admitting: Neurosurgery

## 2015-11-12 DIAGNOSIS — M4806 Spinal stenosis, lumbar region: Secondary | ICD-10-CM | POA: Diagnosis not present

## 2015-11-12 MED ORDER — HYDROCODONE-ACETAMINOPHEN 5-325 MG PO TABS
1.0000 | ORAL_TABLET | ORAL | Status: DC | PRN
Start: 2015-11-12 — End: 2016-02-20

## 2015-11-12 NOTE — Progress Notes (Signed)
Patient ID: Lance [name], male   DOB: [birth year], 83 y.o.   MRN: [id number] Patient doing well improved leg pain condition of back pain  Strength 5  out of 5 wound clean dry and intact  Discharge home

## 2015-11-12 NOTE — Discharge Instructions (Signed)
No lifting no bending no twisting no driving a riding a car unless he is coming back and forth to see me. Keep the incision clean dry and intact. May shower today leave the outer dressing on for another 2-3 days then you may remove the outer dressing leaving the Steri-Strips on and intact. When the Steri-Strips are exposed cover him up for showers only.   Wound Care Keep incision covered and dry for one week.  If you shower prior to then, cover incision with plastic wrap.  You may remove outer bandage after one week and shower.  Do not put any creams, lotions, or ointments on incision. Leave steri-strips on neck.  They will fall off by themselves. Activity Walk each and every day, increasing distance each day. No lifting greater than 5 lbs.  Avoid bending, arching, or twisting. No driving for 2 weeks; may ride as a passenger locally. If provided with back brace, wear when out of bed.  It is not necessary to wear in bed. Diet Resume your normal diet.  Return to Work Will be discussed at you follow up appointment. Call Your Doctor If Any of These Occur Redness, drainage, or swelling at the wound.  Temperature greater than 101 degrees. Severe pain not relieved by pain medication. Incision starts to come apart. Follow Up Appt Call today for appointment in 1-2 weeks [id number]) or for problems.  If you have any hardware placed in your spine, you will need an x-ray before your appointment.   Laminectomy During a laminectomy, small pieces of bone in the spine called lamina are removed. The ligaments underneath the lamina and parts of the joints that have grown too large are also removed. This takes pressure off the nerves.  LET [facility] CARE PROVIDER KNOW ABOUT:  Any allergies you have.  All medicines you are taking, including vitamins, herbs, eye drops, creams, and over-the-counter medicines.  Previous problems you or members of your family have had with the use of anesthetics.  Any  blood disorders you have.  Previous surgeries you have had.  Medical conditions you have. RISKS AND COMPLICATIONS  Generally, laminectomy is a safe procedure. However, as with any procedure, complications can occur. Possible complications include:  Infection near the incision.  Nerve damage. Signs of this can be pain, weakness, or numbness.  Leaking of spinal fluid.  Blood clot in a leg. The clot can move to the lungs. This can be very serious.  Bowel or bladder incontinence (rare). BEFORE THE PROCEDURE   You will need to stop taking certain medicines as directed by your health care provider.  If you smoke, stop at least 2 weeks before the procedure. Smoking can slow down the healing process and increase the risk of complications.  Do not eat or drink anything for at least 8 hours before the procedure. Take any medicines that your health care provider tells you to keep taking with a sip of water.  Do not drink alcohol the day before your surgery.  Tell your health care provider if you develop a cold or any infection before your surgery.  Arrange for someone to drive you home after the procedure or after your hospital stay. Also arrange for someone to help you with activities during recovery. PROCEDURE  Small monitors will be placed on your body. They are used to check your heart, blood pressure, and oxygen level.  An IV tube will be inserted into one of your veins. Medicine will flow directly into your body through  the IV tube.  You might be given a sedative. This will help you relax.  You will be given a medicine to make you sleep (general anesthetic), and a breathing tube will be placed into your lungs. During general anesthesia, you are unaware of the procedure and do not feel any pain.  Your back will be cleaned with a special solution to kill germs on your skin.  Once you are asleep, the surgeon will make a 2-inch to 5-inch cut (incision) in your back. The length of the  incision will depend on how many spinal bones (vertebrae) are being operated on.  Muscles in the back will be moved away from the vertebrae and pulled to the side.  Pieces of lamina will be removed.  The ligament that lies under the lamina and connects your vertebrae will be removed.  Enough ligaments and thickened joints will be removed to take pressure off your nerves.  Your nerves will be identified, and their passage will be tracked and assessed for excessive tightness.  Your back muscles will be moved back into their normal position.  The area under your skin will be closed with small, absorbable stitches. These stitches do not need to be removed.  Your skin will be closed with small absorbable stitches or staples.  A dressing will be put over your incision.  The procedure may take 1-3 hours. AFTER THE PROCEDURE   You will stay in a recovery area until the anesthesia has worn off. Your blood pressure and pulse will be checked every so often. Then you will be taken to a hospital room.  You may continue to get fluids through the IV tube for a while.  Some pain is normal. You may be given pain medicine while still in the recovery area.  It is important to be up and moving as soon as possible after a surgery. Physical therapists will help you start walking.  To prevent blood clots in your legs:  You may be given special stockings to wear.  You may need to take medicine to prevent clots.  You may be asked to do special breathing exercises to re-expand your lungs. This is to prevent a lung infection.  Most people stay in the hospital for 1-3 days after a laminectomy.   This information is not intended to replace advice given to you by your health care provider. Make sure you discuss any questions you have with your health care provider.   Document Released: 09/02/2009 Document Revised: 07/05/2013 Document Reviewed: 04/26/2013 Elsevier Interactive Patient Education [employer]

## 2015-11-12 NOTE — Evaluation (Signed)
Occupational Therapy Evaluation and Discharge Patient Details Name: Lance [name] MRN: [id number] DOB: 09-27-1932 Today's Date: 11/12/2015    History of Present Illness Pt is an 82 y/o male who presents s/p L4-L5 lumbar laminectomy and decompression on 11/11/15.    Clinical Impression   This 82 yo male admitted and underwent above presents to acute OT with all education completed with pt and his wife, we will D/C from acute OT.    Follow Up Recommendations  No OT follow up    Equipment Recommendations   (RW)       Precautions / Restrictions Precautions Precautions: Back Precaution Comments: pt able to recall 3/3 back precautions Restrictions Weight Bearing Restrictions: No      Mobility Bed Mobility   Bed Mobility:  (verbally reviewed technique)           General bed mobility comments: Pt sitting EOB upon my entering room  Transfers Overall transfer level: Needs assistance Equipment used: Rolling walker (2 wheeled) Transfers: Sit to/from Stand Sit to Stand: Supervision              Balance Overall balance assessment: Needs assistance   Sitting balance-Leahy Scale: Good     Standing balance support: Bilateral upper extremity supported Standing balance-Leahy Scale: Good                              ADL Overall ADL's : Needs assistance/impaired Eating/Feeding: Independent;Sitting     Grooming Details (indicate cue type and reason): I educated pt and wife on use of two cups for brushing teeth (spit and rinse cup) to avoid bending over sink Upper Body Bathing: Supervision/ safety;Sitting   Lower Body Bathing: Moderate assistance (S sit<>stand)   Upper Body Dressing : Supervision/safety;Sitting   Lower Body Dressing: Maximal assistance (S sit<>stand)   Toilet Transfer: Supervision/safety;Ambulation;BSC;RW (over toilet)     Toileting - Clothing Manipulation Details (indicate cue type and reason): Educated pt and wife on using wet  wipes to help avoid twisting for back peri care       General ADL Comments: Wife will A with LBD (especially with getting dressed) with pt doing mainly the undressing the first couple of weeks.                Pertinent Vitals/Pain Pain Assessment: 0-10 Pain Score: 3  Pain Location: back Pain Descriptors / Indicators: Discomfort;Sore;Operative site guarding Pain Intervention(s): Monitored during session;Repositioned     Hand Dominance Right   Extremity/Trunk Assessment Upper Extremity Assessment Upper Extremity Assessment: Overall WFL for tasks assessed           Communication Communication Communication: HOH   Cognition Arousal/Alertness: Awake/alert Behavior During Therapy: WFL for tasks assessed/performed Overall Cognitive Status: Within Functional Limits for tasks assessed                                Home Living Family/patient expects to be discharged to:: Private residence Living Arrangements: Spouse/significant other Available Help at Discharge: Family;Available 24 hours/day Type of Home: House Home Access: Stairs to enter [employer] of Steps: 3 Entrance Stairs-Rails: Right;Left;Can reach both Home Layout: Two level Alternate Level Stairs-Number of Steps: 2 steps to get to sun room   Bathroom Shower/Tub: Walk-in shower     Bathroom Accessibility: Yes   Home Equipment: Environmental consultant - 2 wheels;Cane - single point;Shower seat - built in;Hand held shower head;Toilet riser;Grab bars -  toilet;Grab bars - tub/shower          Prior Functioning/Environment Level of Independence: Independent with assistive device(s)        Comments: Used cane and then progressed to walker as pain increased prior to surgery    OT Diagnosis: Generalized weakness;Acute pain         OT Goals(Current goals can be found in the care plan section) Acute Rehab OT Goals Patient Stated Goal: home today  OT Frequency:                End of Session  Equipment Utilized During Treatment: Rolling walker Nurse Communication:  (Pt needs a RW)  Activity Tolerance: Patient tolerated treatment well Patient left: in chair;with call bell/phone within reach;with family/visitor present   Time: 0747-0809 OT Time Calculation (min): 22 min Charges:  OT General Charges $OT Visit: 1 Procedure OT Evaluation $OT Eval Moderate Complexity: 1 Procedure G-Codes: OT G-codes **NOT FOR INPATIENT CLASS** Functional Assessment Tool Used: Clinical observation Functional Limitation: Self care Self Care Current Status [id number]): At least 1 percent but less than 20 percent impaired, limited or restricted Self Care Goal Status [id number]): At least 1 percent but less than 20 percent impaired, limited or restricted Self Care Discharge Status [phone]): At least 1 percent but less than 20 percent impaired, limited or restricted  Almon Register [id number] 11/12/2015, 11:00 AM

## 2015-11-12 NOTE — Progress Notes (Signed)
Pt and wife given D/C instructions with Rx, verbal understanding was provided. Pt's incision is covered with Honeycomb dressing and is clean and dry with no sign of infection. Pt's IV was removed prior to D/C. Pt D/C'd home via wheelchair per MD order. Pt is stable @ D/C and has no other needs at this time. Holli Humbles, RN

## 2015-11-12 NOTE — Progress Notes (Signed)
Physical Therapy Treatment Patient Details Name: Lance [name] MRN: [id number] DOB: 03-05-1932 Today's Date: 11/12/2015    History of Present Illness Pt is an 82 y/o male who presents s/p L4-L5 lumbar laminectomy and decompression on 11/11/15.     PT Comments    Pt is making excellent progress with mobility. Pt and spouse are educated on back precautions, basic mobility and home exercise program. Therapist demonstrated basic LE exercises and provided a handout for home use. Pt reports he has a RW at home he would like to try. Recommend outpatient PT for continued mobility and strengthening. Pt and spouse are agreeable to d/c home today.  Follow Up Recommendations  Outpatient PT     Equipment Recommendations  None recommended by PT;Other (comment) (pt wants to try a RW he has a home)    Recommendations for Other Services       Precautions / Restrictions Precautions Precautions: Back Precaution Comments: pt able to recall 3/3 back precautions Restrictions Weight Bearing Restrictions: No    Mobility  Bed Mobility   Bed Mobility:  (verbally reviewed technique)              Transfers Overall transfer level: Modified independent Equipment used: Rolling walker (2 wheeled) Transfers: Sit to/from Stand Sit to Stand: Modified independent (Device/Increase time)            Ambulation/Gait Ambulation/Gait assistance: Modified independent (Device/Increase time) Ambulation Distance (Feet): 110 Feet Assistive device: Rolling walker (2 wheeled) Gait Pattern/deviations: Step-through pattern;Decreased stride length;Trunk flexed   Gait velocity interpretation: Below normal speed for age/gender     Stairs Stairs: Yes Stairs assistance: Min guard Stair Management: Two rails Number of Stairs: 3    Wheelchair Mobility    Modified Rankin (Stroke Patients Only)       Balance Overall balance assessment: Needs assistance   Sitting balance-Leahy Scale: Good     Standing balance support: Bilateral upper extremity supported Standing balance-Leahy Scale: Good                      Cognition Arousal/Alertness: Awake/alert Behavior During Therapy: WFL for tasks assessed/performed Overall Cognitive Status: Within Functional Limits for tasks assessed                      Exercises      General Comments        Pertinent Vitals/Pain Pain Assessment: 0-10 Pain Score: 3  Pain Location: back Pain Descriptors / Indicators: Aching Pain Intervention(s): Limited activity within patient's tolerance;Monitored during session    Home Living                      Prior Function            PT Goals (current goals can now be found in the care plan section) Progress towards PT goals: Progressing toward goals    Frequency  Min 5X/week    PT Plan Current plan remains appropriate    Co-evaluation             End of Session   Activity Tolerance: Patient tolerated treatment well Patient left: in chair;with call bell/phone within reach;with family/visitor present     Time: 0806-0829 PT Time Calculation (min) (ACUTE ONLY): 23 min  Charges:  $Gait Training: 8-22 mins $Self Care/Home Management: 8-22                    G Codes:  Functional Assessment Tool Used: Clinical  judgement Functional Limitation: Mobility: Walking and moving around Mobility: Walking and Moving Around Current Status [phone]): At least 1 percent but less than 20 percent impaired, limited or restricted Mobility: Walking and Moving Around Goal Status [phone]): At least 1 percent but less than 20 percent impaired, limited or restricted Mobility: Walking and Moving Around Discharge Status [phone]): At least 1 percent but less than 20 percent impaired, limited or restricted   Lelon Mast 11/12/2015, 8:36 AM

## 2015-11-12 NOTE — Discharge Summary (Signed)
  Physician Discharge Summary  Patient ID: Lance [name] MRN: [id number] DOB/AGE: 1932/07/07 82 y.o.  Admit date: 11/11/2015 Discharge date: 11/12/2015  Admission Diagnoses: Lumbar spinal stenosis L4-5  Discharge Diagnoses: Same Active Problems:   Spinal stenosis at L4-L5 level   Discharged Condition: good  Hospital Course: Patient is [facility] underwent decompressive laminectomy at L4-5 postoperatively patient did very well went to come in the floor on the floor he is ambulating and voiding spontaneously tolerating regular diet was stable for discharge home.  Consults: Significant Diagnostic Studies: Treatments: Decompressive laminectomy L4-5 Discharge Exam: Blood pressure 114/64, pulse 98, temperature 98 F (36.7 C), temperature source Oral, resp. rate 20, weight 96.163 kg (212 lb), SpO2 98 %. Strength out of 5 wound clean dry and intact  Disposition: Home     Medication List    TAKE these medications        amLODipine 5 MG tablet  Commonly known as:  NORVASC  Take 5 mg by mouth daily.     aspirin 325 MG tablet  Take 650 mg by mouth daily.     buPROPion 150 MG 12 hr tablet  Commonly known as:  WELLBUTRIN SR  Take 150 mg by mouth 2 (two) times daily.     COMBIGAN OP  Place 1 drop into both eyes 2 (two) times daily.     [facility] NIACIN FLUSH FREE 400-100 MG Caps  Generic drug:  Niacin-Inositol  Take 1 tablet by mouth daily.     diazepam 10 MG tablet  Commonly known as:  VALIUM  Take 10 mg by mouth every 8 (eight) hours as needed for anxiety.     doxepin 75 MG capsule  Commonly known as:  SINEQUAN  Take 1 capsule (75 mg total) by mouth at bedtime.     ergocalciferol 50000 units capsule  Commonly known as:  VITAMIN D2  Take 50,000 Units by mouth once a week. On monday     finasteride 5 MG tablet  Commonly known as:  PROSCAR  Take 5 mg by mouth every morning.     FLOMAX 0.4 MG Caps capsule  Generic drug:  tamsulosin  Take 0.8 mg by mouth every  morning.     HYDROcodone-acetaminophen 5-325 MG tablet  Commonly known as:  NORCO/VICODIN  Take 1 tablet by mouth 3 (three) times daily as needed for moderate pain.     HYDROcodone-acetaminophen 5-325 MG tablet  Commonly known as:  NORCO/VICODIN  Take 1-2 tablets by mouth every 4 (four) hours as needed for severe pain.     multivitamin capsule  Take 1 capsule by mouth daily.     Omega 3 1000 MG Caps  Take 1 capsule by mouth 3 (three) times daily.     Timolol Maleate PF 0.5 % Soln  Place 1 drop into both eyes every morning.     valsartan 320 MG tablet  Commonly known as:  DIOVAN  Take 320 mg by mouth daily.     vitamin C 500 MG tablet  Commonly known as:  ASCORBIC ACID  Take 500 mg by mouth daily.           Follow-up Information    Follow up with [facility] P, MD.   Specialty:  Neurosurgery   Contact information:   [address] [place] [address] [phone]       Signed: Elaina Hoops 11/12/2015, 9:41 AM

## 2015-11-27 ENCOUNTER — Other Ambulatory Visit: Payer: Self-pay | Admitting: Neurology

## 2015-12-25 ENCOUNTER — Other Ambulatory Visit: Payer: Self-pay | Admitting: Neurology

## 2016-01-07 ENCOUNTER — Telehealth: Payer: Self-pay | Admitting: Neurology

## 2016-01-07 DIAGNOSIS — Z9989 Dependence on other enabling machines and devices: Principal | ICD-10-CM

## 2016-01-07 DIAGNOSIS — G4733 Obstructive sleep apnea (adult) (pediatric): Secondary | ICD-10-CM

## 2016-01-07 NOTE — Telephone Encounter (Signed)
Order for cpap titration reordered.

## 2016-01-07 NOTE — Telephone Encounter (Signed)
I need an order for this patient's CPAP.  I can't see one in the system to attach.

## 2016-01-22 ENCOUNTER — Other Ambulatory Visit: Payer: Self-pay | Admitting: Neurology

## 2016-01-23 ENCOUNTER — Ambulatory Visit (INDEPENDENT_AMBULATORY_CARE_PROVIDER_SITE_OTHER): Payer: Medicare Other | Admitting: Neurology

## 2016-01-23 DIAGNOSIS — G4733 Obstructive sleep apnea (adult) (pediatric): Secondary | ICD-10-CM

## 2016-01-23 DIAGNOSIS — Z9989 Dependence on other enabling machines and devices: Principal | ICD-10-CM

## 2016-01-29 ENCOUNTER — Telehealth: Payer: Self-pay

## 2016-01-29 DIAGNOSIS — G4731 Primary central sleep apnea: Secondary | ICD-10-CM

## 2016-01-29 NOTE — Telephone Encounter (Signed)
Spoke to pt's wife Marcie Bal (per [facility]) regarding pt's sleep study results. I advised her that pt's study revealed that pt need a bipap to control his apnea. Dr. Brett Fairy recommends starting a bipap. Pt's wife is agreeable to starting a bipap. Pt's wife says that they currently use [facility] and asked that the order be sent to [facility]. I advised pt's wife that pt must use his bipap at least four hours per night. Pt's wife reports that he often takes off his cpap while he is sleeping. I strongly encouraged pt's wife that he must use it at least four hours per night. A follow up appt was made for 7/31 at 4:00. Pt needs an extended visit to include a MOCA, per Dr. Brett Fairy.

## 2016-02-17 ENCOUNTER — Other Ambulatory Visit: Payer: Self-pay | Admitting: Physician Assistant

## 2016-02-21 ENCOUNTER — Encounter (HOSPITAL_COMMUNITY)
Admission: RE | Admit: 2016-02-21 | Discharge: 2016-02-21 | Disposition: A | Discharge status: Home / Self Care | Payer: Medicare Other | Source: Ambulatory Visit | Attending: Orthopaedic Surgery | Admitting: Orthopaedic Surgery

## 2016-02-21 ENCOUNTER — Encounter (HOSPITAL_COMMUNITY): Payer: Self-pay

## 2016-02-21 DIAGNOSIS — G4733 Obstructive sleep apnea (adult) (pediatric): Secondary | ICD-10-CM | POA: Diagnosis not present

## 2016-02-21 DIAGNOSIS — Z01812 Encounter for preprocedural laboratory examination: Secondary | ICD-10-CM | POA: Insufficient documentation

## 2016-02-21 DIAGNOSIS — M1712 Unilateral primary osteoarthritis, left knee: Secondary | ICD-10-CM | POA: Diagnosis not present

## 2016-02-21 DIAGNOSIS — E785 Hyperlipidemia, unspecified: Secondary | ICD-10-CM | POA: Insufficient documentation

## 2016-02-21 DIAGNOSIS — Z7982 Long term (current) use of aspirin: Secondary | ICD-10-CM | POA: Insufficient documentation

## 2016-02-21 DIAGNOSIS — Z01818 Encounter for other preprocedural examination: Secondary | ICD-10-CM | POA: Insufficient documentation

## 2016-02-21 DIAGNOSIS — Z79899 Other long term (current) drug therapy: Secondary | ICD-10-CM | POA: Insufficient documentation

## 2016-02-21 DIAGNOSIS — Z87891 Personal history of nicotine dependence: Secondary | ICD-10-CM | POA: Insufficient documentation

## 2016-02-21 DIAGNOSIS — I10 Essential (primary) hypertension: Secondary | ICD-10-CM | POA: Diagnosis not present

## 2016-02-21 LAB — BASIC METABOLIC PANEL
ANION GAP: 7 (ref 5–15)
BUN: 15 mg/dL (ref 6–20)
CO2: 28 mmol/L (ref 22–32)
CREATININE: 1.03 mg/dL (ref 0.61–1.24)
Calcium: 9.7 mg/dL (ref 8.9–10.3)
Chloride: 104 mmol/L (ref 101–111)
GFR calc non Af Amer: >60 mL/min (ref >60)
Glucose, Bld: 106 mg/dL — ABNORMAL HIGH (ref 65–99)
Potassium: 4.3 mmol/L (ref 3.5–5.1)
SODIUM: 139 mmol/L (ref 135–145)

## 2016-02-21 LAB — CBC
HEMATOCRIT: 40.9 % (ref 39.0–52.0)
HEMOGLOBIN: 13.1 g/dL (ref 13.0–17.0)
MCH: 30 pg (ref 26.0–34.0)
MCHC: 32 g/dL (ref 30.0–36.0)
MCV: 93.8 fL (ref 78.0–100.0)
Platelets: 254 10*3/uL (ref 150–400)
RBC: 4.36 MIL/uL (ref 4.22–5.81)
RDW: 13.2 % (ref 11.5–15.5)
WBC: 11 10*3/uL — ABNORMAL HIGH (ref 4.0–10.5)

## 2016-02-21 LAB — SURGICAL PCR SCREEN
MRSA, PCR: NEGATIVE
STAPHYLOCOCCUS AUREUS: NEGATIVE

## 2016-02-21 NOTE — Pre-Procedure Instructions (Signed)
    Lance [name]  02/21/2016      [id number] [name], [place] [address] [place] [address] Phone: [phone] Fax: [phone]    Your procedure is scheduled on 02/25/16.  Report to [facility] Admitting at 1230 pm  Call this number if you have problems the morning of surgery:  [phone]   Remember:  Do not eat food or drink liquids after midnight.  Take these medicines the morning of surgery with A SIP OF WATER --tylenol,norvasc,wellbutrin,valium,proscar,percocet,flomax   Do not wear jewelry, make-up or nail polish.  Do not wear lotions, powders, or perfumes.  You may wear deodorant.  Do not shave 48 hours prior to surgery.  Men may shave face and neck.  Do not bring valuables to the hospital.  [facility] is not responsible for any belongings or valuables.  Contacts, dentures or bridgework may not be worn into surgery.  Leave your suitcase in the car.  After surgery it may be brought to your room.  For patients admitted to the hospital, discharge time will be determined by your treatment team.  Patients discharged the day of surgery will not be allowed to drive home.   Name and phone number of your driver:   Special instructions:    Please read over the following fact sheets that you were given. MRSA Information

## 2016-02-21 NOTE — Progress Notes (Signed)
Anesthesia Chart Review:  Pt is an 82 year old male scheduled for L unicompartmental knee arthroplasty on 02/25/2016 with Dr. Kathrynn Speed.   PMH includes:  HTN, hyperlipidemia, OSA, remote hx alcohol abuse. Former smoker. BMI 27.5. S/p L4-5 laminectomy 11/11/15.   Medications include: amlodipine, ASA, timolol, valsartan  Preoperative labs reviewed.    EKG 11/08/15: Sinus tachycardia (102 bpm). Left axis deviation. Moderate voltage criteria for LVH, may be normal variant.  EKG 06/04/13:  - Left ventricle: The cavity size was normal. Systolic function was hyperdynamic - near cavity obliteration. The estimated ejection fraction was in the range of 65% to 70%. Although no diagnostic regional wall motion abnormality was identified, this possibility cannot be completely excluded on the basis of this study. Doppler parameters are consistent with abnormal left ventricular relaxation (grade 1 diastolic dysfunction). Doppler parameters are consistent with elevated mean left atrialfilling pressure. - Right ventricle: Hypertrophy was present. - Atrial septum: No defect or patent foramen ovale wasidentified. - Impressions: Normal study within technical limitations. Occaisional ectopy was noted on this study, making this studytechnically difficult for the assessment of cardiac function. Lack of sufficient imaging and doppler data toassess volume status & PA pressures.The LV near cavitiy obliteration would suggest dehydrationas a potential component of syncope.  Pt tolerated lumbar laminectomy without issue in February.   If no changes, I anticipate pt can proceed with surgery as scheduled.   Willeen Cass, FNP-BC [facility]/Anesthesiology Phone: [phone] 02/21/2016 2:17 PM

## 2016-02-22 ENCOUNTER — Other Ambulatory Visit: Payer: Self-pay | Admitting: Neurology

## 2016-02-25 ENCOUNTER — Observation Stay (HOSPITAL_COMMUNITY)
Admission: RE | Admit: 2016-02-25 | Discharge: 2016-02-29 | Disposition: A | Discharge status: Home Health | Payer: Medicare Other | Source: Ambulatory Visit | Attending: Orthopaedic Surgery | Admitting: Orthopaedic Surgery

## 2016-02-25 ENCOUNTER — Encounter (HOSPITAL_COMMUNITY): Payer: Self-pay | Admitting: *Deleted

## 2016-02-25 ENCOUNTER — Ambulatory Visit (HOSPITAL_COMMUNITY): Payer: Medicare Other | Admitting: Anesthesiology

## 2016-02-25 ENCOUNTER — Ambulatory Visit (HOSPITAL_COMMUNITY): Payer: Medicare Other | Admitting: Emergency Medicine

## 2016-02-25 ENCOUNTER — Observation Stay (HOSPITAL_COMMUNITY): Payer: Medicare Other

## 2016-02-25 ENCOUNTER — Encounter (HOSPITAL_COMMUNITY)
Admission: RE | Disposition: A | Discharge status: Home Health | Payer: Self-pay | Source: Ambulatory Visit | Attending: Orthopaedic Surgery

## 2016-02-25 DIAGNOSIS — F329 Major depressive disorder, single episode, unspecified: Secondary | ICD-10-CM | POA: Diagnosis not present

## 2016-02-25 DIAGNOSIS — Z7982 Long term (current) use of aspirin: Secondary | ICD-10-CM | POA: Insufficient documentation

## 2016-02-25 DIAGNOSIS — H409 Unspecified glaucoma: Secondary | ICD-10-CM | POA: Diagnosis not present

## 2016-02-25 DIAGNOSIS — N138 Other obstructive and reflux uropathy: Secondary | ICD-10-CM | POA: Insufficient documentation

## 2016-02-25 DIAGNOSIS — N401 Enlarged prostate with lower urinary tract symptoms: Secondary | ICD-10-CM | POA: Insufficient documentation

## 2016-02-25 DIAGNOSIS — M1712 Unilateral primary osteoarthritis, left knee: Secondary | ICD-10-CM | POA: Diagnosis present

## 2016-02-25 DIAGNOSIS — I10 Essential (primary) hypertension: Secondary | ICD-10-CM | POA: Insufficient documentation

## 2016-02-25 DIAGNOSIS — F419 Anxiety disorder, unspecified: Secondary | ICD-10-CM | POA: Diagnosis not present

## 2016-02-25 DIAGNOSIS — Z79899 Other long term (current) drug therapy: Secondary | ICD-10-CM | POA: Insufficient documentation

## 2016-02-25 DIAGNOSIS — E559 Vitamin D deficiency, unspecified: Secondary | ICD-10-CM | POA: Insufficient documentation

## 2016-02-25 DIAGNOSIS — Z96652 Presence of left artificial knee joint: Secondary | ICD-10-CM

## 2016-02-25 DIAGNOSIS — Z87891 Personal history of nicotine dependence: Secondary | ICD-10-CM | POA: Diagnosis not present

## 2016-02-25 DIAGNOSIS — E7800 Pure hypercholesterolemia, unspecified: Secondary | ICD-10-CM | POA: Diagnosis not present

## 2016-02-25 DIAGNOSIS — G4733 Obstructive sleep apnea (adult) (pediatric): Secondary | ICD-10-CM | POA: Diagnosis not present

## 2016-02-25 HISTORY — PX: REPLACEMENT UNICONDYLAR JOINT KNEE: SUR1227

## 2016-02-25 HISTORY — PX: PARTIAL KNEE ARTHROPLASTY: SHX2174

## 2016-02-25 SURGERY — ARTHROPLASTY, KNEE, UNICOMPARTMENTAL
Anesthesia: Regional | Laterality: Left

## 2016-02-25 MED ORDER — ACETAMINOPHEN 650 MG RE SUPP: 650.0000 mg | Freq: Four times a day (QID) | RECTAL | Status: DC | PRN

## 2016-02-25 MED ORDER — 0.9 % SODIUM CHLORIDE (POUR BTL) OPTIME
TOPICAL | Status: DC | PRN
  Administered 2016-02-25: 1000 mL

## 2016-02-25 MED ORDER — SODIUM CHLORIDE 0.9 % IV SOLN
INTRAVENOUS | Status: DC
  Administered 2016-02-25: 19:00:00 via INTRAVENOUS

## 2016-02-25 MED ORDER — FENTANYL CITRATE (PF) 100 MCG/2ML IJ SOLN
50.0000 ug | Freq: Once | INTRAMUSCULAR | Status: AC
  Administered 2016-02-25: 50 ug via INTRAVENOUS

## 2016-02-25 MED ORDER — DIPHENHYDRAMINE HCL 12.5 MG/5ML PO ELIX: 12.5000 mg | ORAL_SOLUTION | ORAL | Status: DC | PRN

## 2016-02-25 MED ORDER — DOCUSATE SODIUM 100 MG PO CAPS
100.0000 mg | ORAL_CAPSULE | Freq: Two times a day (BID) | ORAL | Status: DC
  Administered 2016-02-25 – 2016-02-28 (×7): 100 mg via ORAL
  Filled 2016-02-25 (×8): qty 1

## 2016-02-25 MED ORDER — TIMOLOL MALEATE 0.5 % OP SOLN
1.0000 [drp] | Freq: Every day | OPHTHALMIC | Status: DC
  Administered 2016-02-26 – 2016-02-28 (×3): 1 [drp] via OPHTHALMIC
  Filled 2016-02-25: qty 5

## 2016-02-25 MED ORDER — HYDROMORPHONE HCL 1 MG/ML IJ SOLN
1.0000 mg | INTRAMUSCULAR | Status: DC | PRN
  Administered 2016-02-25 – 2016-02-27 (×6): 1 mg via INTRAVENOUS
  Filled 2016-02-25 (×6): qty 1

## 2016-02-25 MED ORDER — MIDAZOLAM HCL 2 MG/2ML IJ SOLN
1.0000 mg | Freq: Once | INTRAMUSCULAR | Status: AC
  Administered 2016-02-25: 1 mg via INTRAVENOUS

## 2016-02-25 MED ORDER — OXYCODONE HCL 5 MG PO TABS: 5.0000 mg | ORAL_TABLET | Freq: Once | ORAL | Status: DC | PRN

## 2016-02-25 MED ORDER — LACTATED RINGERS IV SOLN
INTRAVENOUS | Status: DC | PRN
  Administered 2016-02-25 (×2): via INTRAVENOUS

## 2016-02-25 MED ORDER — CLINDAMYCIN PHOSPHATE 600 MG/50ML IV SOLN
600.0000 mg | Freq: Four times a day (QID) | INTRAVENOUS | Status: AC
  Administered 2016-02-25 – 2016-02-26 (×2): 600 mg via INTRAVENOUS
  Filled 2016-02-25 (×2): qty 50

## 2016-02-25 MED ORDER — TAMSULOSIN HCL 0.4 MG PO CAPS
0.8000 mg | ORAL_CAPSULE | ORAL | Status: DC
  Administered 2016-02-26 – 2016-02-29 (×4): 0.8 mg via ORAL
  Filled 2016-02-25 (×4): qty 2

## 2016-02-25 MED ORDER — PROPOFOL 10 MG/ML IV BOLUS
INTRAVENOUS | Status: AC
  Filled 2016-02-25: qty 20

## 2016-02-25 MED ORDER — ASPIRIN EC 325 MG PO TBEC
325.0000 mg | DELAYED_RELEASE_TABLET | Freq: Two times a day (BID) | ORAL | Status: DC
  Administered 2016-02-26 – 2016-02-28 (×6): 325 mg via ORAL
  Filled 2016-02-25 (×7): qty 1

## 2016-02-25 MED ORDER — VITAMIN C 500 MG PO TABS
500.0000 mg | ORAL_TABLET | Freq: Every day | ORAL | Status: DC
  Administered 2016-02-26 – 2016-02-28 (×3): 500 mg via ORAL
  Filled 2016-02-25 (×4): qty 1

## 2016-02-25 MED ORDER — IRBESARTAN 300 MG PO TABS
300.0000 mg | ORAL_TABLET | Freq: Every day | ORAL | Status: DC
  Administered 2016-02-26 – 2016-02-28 (×3): 300 mg via ORAL
  Filled 2016-02-25 (×4): qty 1

## 2016-02-25 MED ORDER — PHENYLEPHRINE HCL 10 MG/ML IJ SOLN
INTRAMUSCULAR | Status: DC | PRN
  Administered 2016-02-25 (×2): 80 ug via INTRAVENOUS

## 2016-02-25 MED ORDER — MULTIVITAMINS PO CAPS: 1.0000 | ORAL_CAPSULE | Freq: Every day | ORAL | Status: DC

## 2016-02-25 MED ORDER — MIDAZOLAM HCL 2 MG/2ML IJ SOLN
INTRAMUSCULAR | Status: AC
  Filled 2016-02-25: qty 2

## 2016-02-25 MED ORDER — TIMOLOL MALEATE PF 0.5 % OP SOLN: 1.0000 [drp] | OPHTHALMIC | Status: DC

## 2016-02-25 MED ORDER — ADULT MULTIVITAMIN W/MINERALS CH
1.0000 | ORAL_TABLET | Freq: Every day | ORAL | Status: DC
  Administered 2016-02-25 – 2016-02-28 (×4): 1 via ORAL
  Filled 2016-02-25 (×5): qty 1

## 2016-02-25 MED ORDER — FENTANYL CITRATE (PF) 100 MCG/2ML IJ SOLN
INTRAMUSCULAR | Status: DC | PRN
  Administered 2016-02-25: 50 ug via INTRAVENOUS

## 2016-02-25 MED ORDER — OXYCODONE HCL 5 MG PO TABS
5.0000 mg | ORAL_TABLET | ORAL | Status: DC | PRN
  Administered 2016-02-25 – 2016-02-26 (×5): 10 mg via ORAL
  Administered 2016-02-27 (×3): 15 mg via ORAL
  Administered 2016-02-28 (×2): 5 mg via ORAL
  Administered 2016-02-28: 15 mg via ORAL
  Administered 2016-02-29 (×2): 5 mg via ORAL
  Administered 2016-02-29: 10 mg via ORAL
  Filled 2016-02-25: qty 2
  Filled 2016-02-25: qty 1
  Filled 2016-02-25: qty 2
  Filled 2016-02-25: qty 1
  Filled 2016-02-25: qty 2
  Filled 2016-02-25: qty 3
  Filled 2016-02-25 (×2): qty 2
  Filled 2016-02-25: qty 3
  Filled 2016-02-25: qty 2
  Filled 2016-02-25 (×2): qty 3
  Filled 2016-02-25 (×2): qty 1

## 2016-02-25 MED ORDER — ONDANSETRON HCL 4 MG/2ML IJ SOLN: 4.0000 mg | Freq: Once | INTRAMUSCULAR | Status: DC | PRN

## 2016-02-25 MED ORDER — BUPROPION HCL ER (SR) 150 MG PO TB12
150.0000 mg | ORAL_TABLET | Freq: Two times a day (BID) | ORAL | Status: DC
  Administered 2016-02-25 – 2016-02-28 (×7): 150 mg via ORAL
  Filled 2016-02-25 (×8): qty 1

## 2016-02-25 MED ORDER — DIAZEPAM 5 MG PO TABS
10.0000 mg | ORAL_TABLET | Freq: Three times a day (TID) | ORAL | Status: DC | PRN
  Administered 2016-02-26 – 2016-02-27 (×3): 10 mg via ORAL
  Filled 2016-02-25 (×3): qty 2

## 2016-02-25 MED ORDER — PHENOL 1.4 % MT LIQD: 1.0000 | OROMUCOSAL | Status: DC | PRN

## 2016-02-25 MED ORDER — BUPIVACAINE IN DEXTROSE 0.75-8.25 % IT SOLN
INTRATHECAL | Status: DC | PRN
  Administered 2016-02-25: 2 mL via INTRATHECAL

## 2016-02-25 MED ORDER — LIDOCAINE HCL (CARDIAC) 20 MG/ML IV SOLN
INTRAVENOUS | Status: DC | PRN
  Administered 2016-02-25: 40 mg via INTRAVENOUS

## 2016-02-25 MED ORDER — ONDANSETRON HCL 4 MG/2ML IJ SOLN: 4.0000 mg | Freq: Four times a day (QID) | INTRAMUSCULAR | Status: DC | PRN

## 2016-02-25 MED ORDER — PROPOFOL 500 MG/50ML IV EMUL
INTRAVENOUS | Status: DC | PRN
  Administered 2016-02-25: 50 ug/kg/min via INTRAVENOUS

## 2016-02-25 MED ORDER — FINASTERIDE 5 MG PO TABS
5.0000 mg | ORAL_TABLET | ORAL | Status: DC
  Administered 2016-02-26 – 2016-02-29 (×4): 5 mg via ORAL
  Filled 2016-02-25 (×4): qty 1

## 2016-02-25 MED ORDER — AMLODIPINE BESYLATE 5 MG PO TABS
5.0000 mg | ORAL_TABLET | Freq: Every day | ORAL | Status: DC
  Administered 2016-02-26 – 2016-02-28 (×3): 5 mg via ORAL
  Filled 2016-02-25 (×4): qty 1

## 2016-02-25 MED ORDER — DOXEPIN HCL 75 MG PO CAPS
75.0000 mg | ORAL_CAPSULE | Freq: Every day | ORAL | Status: DC
  Administered 2016-02-25 – 2016-02-28 (×4): 75 mg via ORAL
  Filled 2016-02-25 (×4): qty 1

## 2016-02-25 MED ORDER — METHOCARBAMOL 500 MG PO TABS
500.0000 mg | ORAL_TABLET | Freq: Four times a day (QID) | ORAL | Status: DC | PRN
  Administered 2016-02-25 – 2016-02-28 (×9): 500 mg via ORAL
  Filled 2016-02-25 (×9): qty 1

## 2016-02-25 MED ORDER — LACTATED RINGERS IV SOLN
INTRAVENOUS | Status: DC
  Administered 2016-02-25: 13:00:00 via INTRAVENOUS

## 2016-02-25 MED ORDER — CLINDAMYCIN PHOSPHATE 900 MG/50ML IV SOLN
900.0000 mg | INTRAVENOUS | Status: AC
  Administered 2016-02-25: 900 mg via INTRAVENOUS
  Filled 2016-02-25: qty 50

## 2016-02-25 MED ORDER — CHLORHEXIDINE GLUCONATE 4 % EX LIQD: 60.0000 mL | Freq: Once | CUTANEOUS | Status: DC

## 2016-02-25 MED ORDER — FENTANYL CITRATE (PF) 100 MCG/2ML IJ SOLN
INTRAMUSCULAR | Status: AC
  Filled 2016-02-25: qty 2

## 2016-02-25 MED ORDER — PROPOFOL 10 MG/ML IV BOLUS
INTRAVENOUS | Status: DC | PRN
  Administered 2016-02-25: 20 mg via INTRAVENOUS

## 2016-02-25 MED ORDER — METHOCARBAMOL 1000 MG/10ML IJ SOLN
500.0000 mg | Freq: Four times a day (QID) | INTRAVENOUS | Status: DC | PRN
  Filled 2016-02-25: qty 5

## 2016-02-25 MED ORDER — ACETAMINOPHEN 325 MG PO TABS
650.0000 mg | ORAL_TABLET | Freq: Four times a day (QID) | ORAL | Status: DC | PRN
  Administered 2016-02-26: 650 mg via ORAL
  Filled 2016-02-25: qty 2

## 2016-02-25 MED ORDER — METOCLOPRAMIDE HCL 5 MG/ML IJ SOLN: 5.0000 mg | Freq: Three times a day (TID) | INTRAMUSCULAR | Status: DC | PRN

## 2016-02-25 MED ORDER — METOCLOPRAMIDE HCL 5 MG PO TABS: 5.0000 mg | ORAL_TABLET | Freq: Three times a day (TID) | ORAL | Status: DC | PRN

## 2016-02-25 MED ORDER — PHENYLEPHRINE HCL 10 MG/ML IJ SOLN
10.0000 mg | INTRAVENOUS | Status: DC | PRN
  Administered 2016-02-25: 50 ug/min via INTRAVENOUS

## 2016-02-25 MED ORDER — OXYCODONE HCL 5 MG/5ML PO SOLN: 5.0000 mg | Freq: Once | ORAL | Status: DC | PRN

## 2016-02-25 MED ORDER — FENTANYL CITRATE (PF) 100 MCG/2ML IJ SOLN: 25.0000 ug | INTRAMUSCULAR | Status: DC | PRN

## 2016-02-25 MED ORDER — ALUM & MAG HYDROXIDE-SIMETH 200-200-20 MG/5ML PO SUSP: 30.0000 mL | ORAL | Status: DC | PRN

## 2016-02-25 MED ORDER — FENTANYL CITRATE (PF) 250 MCG/5ML IJ SOLN
INTRAMUSCULAR | Status: AC
  Filled 2016-02-25: qty 5

## 2016-02-25 MED ORDER — MENTHOL 3 MG MT LOZG: 1.0000 | LOZENGE | OROMUCOSAL | Status: DC | PRN

## 2016-02-25 MED ORDER — ONDANSETRON HCL 4 MG PO TABS: 4.0000 mg | ORAL_TABLET | Freq: Four times a day (QID) | ORAL | Status: DC | PRN

## 2016-02-25 SURGICAL SUPPLY — 68 items
BANDAGE ELASTIC 6 VELCRO ST LF (GAUZE/BANDAGES/DRESSINGS) ×4 IMPLANT
BANDAGE ESMARK 6X9 LF (GAUZE/BANDAGES/DRESSINGS) ×1 IMPLANT
BLADE SAW RECIP 87.9 MT (BLADE) ×2 IMPLANT
BNDG CMPR 9X6 STRL LF SNTH (GAUZE/BANDAGES/DRESSINGS) ×1
BNDG ESMARK 6X9 LF (GAUZE/BANDAGES/DRESSINGS) ×3
BONE CEMENT PALACOSE (Orthopedic Implant) ×3 IMPLANT
BOWL SMART MIX CTS (DISPOSABLE) ×1 IMPLANT
CAPT KNEE PARTIAL 2 ×2 IMPLANT
CEMENT BONE PALACOSE (Orthopedic Implant) IMPLANT
CLOSURE WOUND 1/2 X4 (GAUZE/BANDAGES/DRESSINGS) ×1
COVER SURGICAL LIGHT HANDLE (MISCELLANEOUS) ×3 IMPLANT
CUFF TOURNIQUET SINGLE 34IN LL (TOURNIQUET CUFF) ×3 IMPLANT
CUFF TOURNIQUET SINGLE 44IN (TOURNIQUET CUFF) IMPLANT
DRAPE INCISE IOBAN 66X45 STRL (DRAPES) IMPLANT
DRAPE ORTHO SPLIT 77X108 STRL (DRAPES) ×6
DRAPE PROXIMA HALF (DRAPES) ×3 IMPLANT
DRAPE SURG ORHT 6 SPLT 77X108 (DRAPES) ×2 IMPLANT
DRAPE U-SHAPE 47X51 STRL (DRAPES) ×3 IMPLANT
DRSG PAD ABDOMINAL 8X10 ST (GAUZE/BANDAGES/DRESSINGS) ×3 IMPLANT
DURAPREP 26ML APPLICATOR (WOUND CARE) ×3 IMPLANT
ELECT CAUTERY BLADE 6.4 (BLADE) ×1 IMPLANT
ELECT REM PT RETURN 9FT ADLT (ELECTROSURGICAL) ×3
ELECTRODE REM PT RTRN 9FT ADLT (ELECTROSURGICAL) ×1 IMPLANT
FACESHIELD WRAPAROUND (MASK) ×9 IMPLANT
FACESHIELD WRAPAROUND OR TEAM (MASK) ×3 IMPLANT
GAUZE SPONGE 4X4 12PLY STRL (GAUZE/BANDAGES/DRESSINGS) ×3 IMPLANT
GAUZE XEROFORM 1X8 LF (GAUZE/BANDAGES/DRESSINGS) ×3 IMPLANT
GLOVE BIOGEL PI IND STRL 8 (GLOVE) ×2 IMPLANT
GLOVE BIOGEL PI INDICATOR 8 (GLOVE) ×4
GLOVE ORTHO TXT STRL SZ7.5 (GLOVE) ×3 IMPLANT
GLOVE SURG ORTHO 8.0 STRL STRW (GLOVE) ×3 IMPLANT
GOWN STRL REUS W/ TWL LRG LVL3 (GOWN DISPOSABLE) ×2 IMPLANT
GOWN STRL REUS W/ TWL XL LVL3 (GOWN DISPOSABLE) ×2 IMPLANT
GOWN STRL REUS W/TWL LRG LVL3 (GOWN DISPOSABLE) ×6
GOWN STRL REUS W/TWL XL LVL3 (GOWN DISPOSABLE) ×6
HANDPIECE INTERPULSE COAX TIP (DISPOSABLE) ×3
IMMOBILIZER KNEE 22 UNIV (SOFTGOODS) ×3 IMPLANT
KIT BASIN OR (CUSTOM PROCEDURE TRAY) ×3 IMPLANT
KIT ROOM TURNOVER OR (KITS) ×3 IMPLANT
MANIFOLD NEPTUNE II (INSTRUMENTS) ×3 IMPLANT
NDL SAFETY ECLIPSE 18X1.5 (NEEDLE) ×1 IMPLANT
NEEDLE HYPO 18GX1.5 SHARP (NEEDLE) ×3
NS IRRIG 1000ML POUR BTL (IV SOLUTION) ×3 IMPLANT
PACK BLADE SAW RECIP 70 3 PT (BLADE) ×2 IMPLANT
PACK TOTAL JOINT (CUSTOM PROCEDURE TRAY) ×3 IMPLANT
PACK UNIVERSAL I (CUSTOM PROCEDURE TRAY) ×3 IMPLANT
PAD ARMBOARD 7.5X6 YLW CONV (MISCELLANEOUS) ×3 IMPLANT
PADDING CAST COTTON 6X4 STRL (CAST SUPPLIES) ×3 IMPLANT
SET HNDPC FAN SPRY TIP SCT (DISPOSABLE) ×1 IMPLANT
STAPLER VISISTAT 35W (STAPLE) IMPLANT
STRIP CLOSURE SKIN 1/2X4 (GAUZE/BANDAGES/DRESSINGS) ×2 IMPLANT
SUCTION FRAZIER HANDLE 10FR (MISCELLANEOUS) ×2
SUCTION TUBE FRAZIER 10FR DISP (MISCELLANEOUS) ×1 IMPLANT
SUT MNCRL AB 4-0 PS2 18 (SUTURE) ×3 IMPLANT
SUT VIC AB 0 CT1 27 (SUTURE) ×3
SUT VIC AB 0 CT1 27XBRD ANBCTR (SUTURE) ×1 IMPLANT
SUT VIC AB 1 CT1 27 (SUTURE) ×3
SUT VIC AB 1 CT1 27XBRD ANBCTR (SUTURE) ×1 IMPLANT
SUT VIC AB 2-0 CT1 27 (SUTURE) ×3
SUT VIC AB 2-0 CT1 TAPERPNT 27 (SUTURE) ×1 IMPLANT
SYR 50ML LL SCALE MARK (SYRINGE) ×3 IMPLANT
TOWEL OR 17X24 6PK STRL BLUE (TOWEL DISPOSABLE) ×3 IMPLANT
TOWEL OR 17X26 10 PK STRL BLUE (TOWEL DISPOSABLE) ×3 IMPLANT
TRAY CATH 16FR W/PLASTIC CATH (SET/KITS/TRAYS/PACK) IMPLANT
TRAY FOLEY CATH 16FRSI W/METER (SET/KITS/TRAYS/PACK) IMPLANT
TRAY FOLEY CATH SILVER 14FR (SET/KITS/TRAYS/PACK) ×2 IMPLANT
WATER STERILE IRR 1000ML POUR (IV SOLUTION) ×2 IMPLANT
WRAP KNEE MAXI GEL POST OP (GAUZE/BANDAGES/DRESSINGS) ×3 IMPLANT

## 2016-02-25 NOTE — Anesthesia Procedure Notes (Addendum)
Anesthesia Regional Block:  Adductor canal block  Pre-Anesthetic Checklist: ,, timeout performed, Correct Patient, Correct Site, Correct Laterality, Correct Procedure, Correct Position, site marked, Risks and benefits discussed,  Surgical consent,  Pre-op evaluation,  At surgeon's request and post-op pain management  Laterality: Left  Prep: chloraprep       Needles:  Injection technique: Single-shot  Needle Type: Echogenic Stimulator Needle     Needle Length: 9cm 9 cm Needle Gauge: 21 and 21 G    Additional Needles:  Procedures: ultrasound guided (picture in chart) Adductor canal block Narrative:  Start time: 02/25/2016 1:20 PM End time: 02/25/2016 1:25 PM Injection made incrementally with aspirations every 5 mL.  Events: blood aspirated  Performed by: Personally   Additional Notes: 30 cc 0.5% Bupivacaine with 1:200 Epi injected easily   Spinal Patient location during procedure: OR Start time: 02/25/2016 2:46 PM End time: 02/25/2016 2:48 PM Staffing Anesthesiologist: Suella Broad D Performed by: anesthesiologist  Preanesthetic Checklist Completed: patient identified, site marked, surgical consent, pre-op evaluation, timeout performed, IV checked, risks and benefits discussed and monitors and equipment checked Spinal Block Patient position: sitting Prep: Betadine Patient monitoring: heart rate, continuous pulse ox, blood pressure and cardiac monitor Approach: midline Location: L4-5 Injection technique: single-shot Needle Needle type: Introducer and Pencan  Needle gauge: 24 G Needle length: 9 cm Additional Notes Negative paresthesia. Negative blood return. Positive free-flowing CSF. Expiration date of kit checked and confirmed. Patient tolerated procedure well, without complications.

## 2016-02-25 NOTE — Anesthesia Postprocedure Evaluation (Signed)
Anesthesia Post Note  Patient: Lance [name]  Procedure(s) Performed: Procedure(s) (LRB): LEFT UNICOMPARTMENTAL KNEE ARTHROPLASTY (Left)  Patient location during evaluation: PACU Anesthesia Type: Spinal Level of consciousness: awake and alert Pain management: pain level controlled Vital Signs Assessment: post-procedure vital signs reviewed and stable Respiratory status: spontaneous breathing, nonlabored ventilation, respiratory function stable and patient connected to nasal cannula oxygen Cardiovascular status: blood pressure returned to baseline and stable Postop Assessment: no signs of nausea or vomiting Anesthetic complications: no    Last Vitals:  Filed Vitals:   02/25/16 1705 02/25/16 1720  BP: 129/65 131/66  Pulse: 73 70  Temp:    Resp: 15 16    Last Pain: There were no vitals filed for this visit.               Letzy Gullickson S

## 2016-02-25 NOTE — Brief Op Note (Signed)
02/25/2016  4:31 PM  PATIENT:  Lance [name]  82 y.o. male  PRE-OPERATIVE DIAGNOSIS:  Osteoarthritis left knee  POST-OPERATIVE DIAGNOSIS:  same   PROCEDURE:  Procedure(s): LEFT UNICOMPARTMENTAL KNEE ARTHROPLASTY (Left)  SURGEON:  Surgeon(s) and Role:    * Mcarthur Rossetti, MD - Primary  PHYSICIAN ASSISTANT: Benita Stabile, PA-C  ANESTHESIA:   regional and spinal  EBL:  Total I/O In: 1000 [I.V.:1000] Out: 450 [Urine:400; Blood:50]  COUNTS:  YES  TOURNIQUET:  * Missing tourniquet times found for documented tourniquets in log:  [id number] *  DICTATION: .Other Dictation: Dictation Number [id number]  PLAN OF CARE: Admit for overnight observation  PATIENT DISPOSITION:  PACU - hemodynamically stable.   Delay start of Pharmacological VTE agent (>24hrs) due to surgical blood loss or risk of bleeding: no

## 2016-02-25 NOTE — Progress Notes (Signed)
Patient arrived to 6N from PACU. Report received from [facility], [place]. Patient alert and oriented. Family at bedside. Incision site clean dry and intact.

## 2016-02-25 NOTE — H&P (Signed)
TOTAL KNEE ADMISSION H&P  Patient is being admitted for left partial knee arthroplasty.  Subjective:  Chief Complaint:left knee pain.  HPI: Lance [name], 82 y.o. male, has a history of pain and functional disability in the left knee due to arthritis and has failed non-surgical conservative treatments for greater than 12 weeks to includeNSAID's and/or analgesics, corticosteriod injections, viscosupplementation injections, flexibility and strengthening excercises, supervised PT with diminished ADL's post treatment, use of assistive devices and activity modification.  Onset of symptoms was gradual, starting >10 years ago with gradually worsening course since that time. The patient noted no past surgery on the left knee(s).  Patient currently rates pain in the left knee(s) at 10 out of 10 with activity. Patient has night pain, worsening of pain with activity and weight bearing, pain that interferes with activities of daily living, pain with passive range of motion, crepitus and joint swelling.  Patient has evidence of subchondral sclerosis, periarticular osteophytes and joint space narrowing by imaging studies. There is no active infection.  Patient Active Problem List   Diagnosis Date Noted  . Osteoarthritis of left knee 02/25/2016  . Spinal stenosis at L4-L5 level 11/11/2015  . Aspiration pneumonia of left lower lobe due to gastric secretions ([place]) 10/22/2015  . Bilateral leg weakness 10/22/2015  . 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  . OSA on CPAP 10/22/2015  . Ambulatory dysfunction 10/22/2015  . CVA (cerebral infarction) 06/03/2013  . Syncope and collapse 06/03/2013  . Facial laceration 06/03/2013  . Acute bronchitis 06/03/2013  . Hypertension    Past Medical History  Diagnosis Date  . Osteoarthritis   . Hypogonadism male   . BPH (benign prostatic hypertrophy) with urinary obstruction   . Hypertension   . Osteopenia   . Vitamin D deficiency   . Anxiety   . Depression   .  Diverticulosis   . Colon polyps   . Hypercholesterolemia   . Glaucoma   . Sleep apnea   . Alcohol abuse     Remote hx, stopped in 1970  . OSA on CPAP   . Headache     sinus headaches    Past Surgical History  Procedure Laterality Date  . Tonsillectomy    . Appendectomy  1945  . Shoulder surgery      rt  . Eye surgery      bilateral cataracts  . Lumbar laminectomy/decompression microdiscectomy Bilateral 11/11/2015    Procedure: Bilateral L4-5 Laminectomy;  Surgeon: Kary Kos, MD;  Location: [facility];  Service: Neurosurgery;  Laterality: Bilateral;  Bilateral L4-5 Laminectomy    Prescriptions prior to admission  Medication Sig Dispense Refill Last Dose  . acetaminophen (TYLENOL) 500 MG tablet Take 500 mg by mouth every 6 (six) hours as needed (For pain.).     Marland Kitchen amLODipine (NORVASC) 5 MG tablet Take 5 mg by mouth daily.  8 11/11/2015 at Unknown time  . aspirin 325 MG tablet Take 650 mg by mouth every evening.    Past Month at Unknown time  . buPROPion (WELLBUTRIN SR) 150 MG 12 hr tablet Take 150 mg by mouth 2 (two) times daily.   11/10/2015 at Unknown time  . diazepam (VALIUM) 10 MG tablet Take 10 mg by mouth every 8 (eight) hours as needed for anxiety.    11/11/2015 at Unknown time  . doxepin (SINEQUAN) 75 MG capsule TAKE ONE CAPSULE BY MOUTH AT BEDTIME 30 capsule 0   . ergocalciferol (VITAMIN D2) 50000 UNITS capsule Take 50,000 Units by mouth every Monday.  Past Week at Unknown time  . finasteride (PROSCAR) 5 MG tablet Take 5 mg by mouth every morning.    11/11/2015 at Unknown time  . mirabegron ER (MYRBETRIQ) 25 MG TB24 tablet Take 25 mg by mouth daily.     . Multiple Vitamin (MULTIVITAMIN) capsule Take 1 capsule by mouth daily.    Past Week at Unknown time  . OMEGA 3 1000 MG CAPS Take 1,000 mg by mouth 3 (three) times daily.    Past Week at Unknown time  . OVER THE COUNTER MEDICATION Take 1 tablet by mouth daily. Niacin Flush Free 300-500mg      . oxyCODONE-acetaminophen  (PERCOCET/ROXICET) 5-325 MG tablet Take 1-2 tablets by mouth 3 (three) times daily as needed for severe pain.   0   . Tamsulosin HCl (FLOMAX) 0.4 MG CAPS Take 0.8 mg by mouth every morning.   11/11/2015 at Unknown time  . Timolol Maleate PF 0.5 % SOLN Place 1 drop into both eyes every morning.   11/11/2015 at Unknown time  . valsartan (DIOVAN) 320 MG tablet Take 320 mg by mouth daily.  11 11/10/2015 at Unknown time  . vitamin C (ASCORBIC ACID) 500 MG tablet Take 500 mg by mouth daily.   Past Week at Unknown time   Allergies  Allergen Reactions  . Penicillins Anaphylaxis, Hives and Other (See Comments)    Has patient had a PCN reaction causing immediate rash, facial/tongue/throat swelling, SOB or lightheadedness with hypotension: Yes Has patient had a PCN reaction causing severe rash involving mucus membranes or skin necrosis: No Has patient had a PCN reaction that required hospitalization Yes Has patient had a PCN reaction occurring within the last 10 years: No If all of the above answers are "NO", then may proceed with Cephalosporin use.   . Simvastatin Other (See Comments)    Leg weakness    Social History  Substance Use Topics  . Smoking status: Former Smoker    Quit date: 09/28/1962  . Smokeless tobacco: Never Used  . Alcohol Use: No    No family history on file.   Review of Systems  Musculoskeletal: Positive for back pain and joint pain.  All other systems reviewed and are negative.   Objective:  Physical Exam  Constitutional: He is oriented to person, place, and time. He appears well-developed and well-nourished.  HENT:  Head: Normocephalic and atraumatic.  Eyes: EOM are normal. Pupils are equal, round, and reactive to light.  Neck: Normal range of motion. Neck supple.  Cardiovascular: Normal rate and regular rhythm.   Respiratory: Effort normal and breath sounds normal.  GI: Soft. Bowel sounds are normal.  Musculoskeletal:       Left knee: He exhibits decreased range  of motion, swelling and abnormal alignment. Tenderness found. Medial joint line tenderness noted.  Neurological: He is alert and oriented to person, place, and time.  Skin: Skin is warm and dry.  Psychiatric: He has a normal mood and affect.    Vital signs in last 24 hours:    Labs:   Estimated body mass index is 27.98 kg/(m^2) as calculated from the following:   Height as of 11/08/15: 6\' 1"  (1.854 m).   Weight as of 11/08/15: 96.163 kg (212 lb).   Imaging Review Plain radiographs demonstrate severe degenerative joint disease of the left knee(s). The overall alignment ismild varus. The bone quality appears to be good for age and reported activity level.  Assessment/Plan:  End stage arthritis, left knee medial comaprtment  The patient  history, physical examination, clinical judgment of the provider and imaging studies are consistent with end stage degenerative joint disease of the left knee(s) and partial knee arthroplasty is deemed medically necessary. The treatment options including medical management, injection therapy arthroscopy and arthroplasty were discussed at length. The risks and benefits of partial knee arthroplasty were presented and reviewed. The risks due to aseptic loosening, infection, stiffness, patella tracking problems, thromboembolic complications and other imponderables were discussed. The patient acknowledged the explanation, agreed to proceed with the plan and consent was signed. Patient is being admitted for inpatient treatment for surgery, pain control, PT, OT, prophylactic antibiotics, VTE prophylaxis, progressive ambulation and ADL's and discharge planning. The patient is planning to be discharged home with home health services

## 2016-02-25 NOTE — Anesthesia Preprocedure Evaluation (Addendum)
Anesthesia Evaluation  Patient identified by MRN, date of birth, ID band Patient awake    Reviewed: Allergy & Precautions, NPO status , Patient's Chart, lab work & pertinent test results  Airway Mallampati: II   Neck ROM: Full    Dental  (+) Teeth Intact, Dental Advisory Given   Pulmonary sleep apnea , former smoker,    breath sounds clear to auscultation       Cardiovascular hypertension, Pt. on medications  Rhythm:Regular Rate:Normal     Neuro/Psych  Headaches, PSYCHIATRIC DISORDERS Anxiety Depression    GI/Hepatic negative GI ROS, Neg liver ROS,   Endo/Other  negative endocrine ROS  Renal/GU negative Renal ROS  negative genitourinary   Musculoskeletal  (+) Arthritis ,   Abdominal   Peds negative pediatric ROS (+)  Hematology negative hematology ROS (+)   Anesthesia Other Findings   Reproductive/Obstetrics negative OB ROS                           Lab Results  Component Value Date   WBC 11.0* 02/21/2016   HGB 13.1 02/21/2016   HCT 40.9 02/21/2016   MCV 93.8 02/21/2016   PLT 254 02/21/2016   Lab Results  Component Value Date   INR 1.18 03/05/2010   INR 1.16 03/05/2010     Anesthesia Physical Anesthesia Plan  ASA: III  Anesthesia Plan: Spinal and Regional   Post-op Pain Management:    Induction: Intravenous  Airway Management Planned: Natural Airway and Simple Face Mask  Additional Equipment:   Intra-op Plan:   Post-operative Plan:   Informed Consent: I have reviewed the patients History and Physical, chart, labs and discussed the procedure including the risks, benefits and alternatives for the proposed anesthesia with the patient or authorized representative who has indicated his/her understanding and acceptance.   Dental advisory given  Plan Discussed with: CRNA and Anesthesiologist  Anesthesia Plan Comments:       Anesthesia Quick Evaluation

## 2016-02-25 NOTE — Transfer of Care (Signed)
Immediate Anesthesia Transfer of Care Note  Patient: Lance [name]  Procedure(s) Performed: Procedure(s): LEFT UNICOMPARTMENTAL KNEE ARTHROPLASTY (Left)  Patient Location: PACU  Anesthesia Type:Spinal  Level of Consciousness: awake, alert , oriented and patient cooperative  Airway & Oxygen Therapy: Patient Spontanous Breathing and Patient connected to nasal cannula oxygen  Post-op Assessment: Report given to RN and Post -op Vital signs reviewed and stable  Post vital signs: Reviewed and stable  Last Vitals:  Filed Vitals:   02/25/16 1330 02/25/16 1335  BP: 139/64 140/69  Pulse: 84 82  Temp:    Resp: 13 16    Last Pain: There were no vitals filed for this visit.       Complications: No apparent anesthesia complications

## 2016-02-25 NOTE — Progress Notes (Signed)
Notified Dr. Ninfa Linden and Dr. Linna Caprice that patient was not told to stop ASA and Omega. Per Dr. Ninfa Linden we will proceed on.

## 2016-02-26 DIAGNOSIS — M1712 Unilateral primary osteoarthritis, left knee: Secondary | ICD-10-CM | POA: Diagnosis not present

## 2016-02-26 NOTE — Care Management Note (Signed)
Case Management Note  Patient Details  Name: Lance [name] MRN: [id number] Date of Birth: 05-15-1932  Subjective/Objective:                    Action/Plan:  Spoke to patient and wife at bedside. Confirmed face sheet information with wife. Choice offered , patient has had [facility] in the past and would like them again . Patient requesting [facility] ( through [facility] ) . Called [facility] spoke with Judson Roch who requested clinicals and orders be faxed to [facility] [place] [phone] . Same done. Awaiting call back.   Patient already has walker at home . Expected Discharge Date:                  Expected Discharge Plan:  [place]  In-House Referral:     Discharge planning Services  CM Consult  Post Acute Care Choice:    Choice offered to:  Patient, Spouse  DME Arranged:    DME Agency:     HH Arranged:  PT HH Agency:  [facility]  Status of Service:  In process, will continue to follow  Medicare Important Message Given:    Date Medicare IM Given:    Medicare IM give by:    Date Additional Medicare IM Given:    Additional Medicare Important Message give by:     If discussed at [place] of Stay Meetings, dates discussed:    Additional Comments:  Marilu Favre, RN 02/26/2016, 10:50 AM

## 2016-02-26 NOTE — Progress Notes (Signed)
Subjective: 1 Day Post-Op Procedure(s) (LRB): LEFT UNICOMPARTMENTAL KNEE ARTHROPLASTY (Left) Patient reports pain as moderate.  Wife at the bedside and does report him having some confusion.  Objective: Vital signs in last 24 hours: Temp:  [97.4 F (36.3 C)-100.9 F (38.3 C)] 100.9 F (38.3 C) (05/31 0600) Pulse Rate:  [70-100] 100 (05/31 0515) Resp:  [9-21] 19 (05/31 0515) BP: (110-156)/(55-83) 113/55 mmHg (05/31 0515) SpO2:  [93 %-100 %] 98 % (05/31 0515) Weight:  [97.569 kg (215 lb 1.6 oz)] 97.569 kg (215 lb 1.6 oz) (05/30 1222)  Intake/Output from previous day: 05/30 0701 - 05/31 0700 In: 2291 [P.O.:660; I.V.:1531; IV Piggyback:100] Out: 2100 [Urine:2050; Blood:50] Intake/Output this shift:    No results for input(s): HGB in the last 72 hours. No results for input(s): WBC, RBC, HCT, PLT in the last 72 hours. No results for input(s): NA, K, CL, CO2, BUN, CREATININE, GLUCOSE, CALCIUM in the last 72 hours. No results for input(s): LABPT, INR in the last 72 hours.  Sensation intact distally Intact pulses distally Dorsiflexion/Plantar flexion intact Incision: scant drainage No cellulitis present Compartment soft  Assessment/Plan: 1 Day Post-Op Procedure(s) (LRB): LEFT UNICOMPARTMENTAL KNEE ARTHROPLASTY (Left) Up with therapy Plan for discharge tomorrow Discharge home with home health  Lance [name], Lance [name] 02/26/2016, 7:19 AM

## 2016-02-26 NOTE — Care Management Obs Status (Signed)
[facility] NOTIFICATION   Patient Details  Name: Lance [name] MRN: [id number] Date of Birth: 1932-03-30   Medicare Observation Status Notification Given:  Yes (Medicare procedure)    Marilu Favre, RN 02/26/2016, 8:55 AM

## 2016-02-26 NOTE — Op Note (Signed)
Lance [name], Lance [name]        ACCOUNT NO.:  [id number]  MEDICAL RECORD NO.:  [id number]  LOCATION:  6N07C                        FACILITY:  [facility]  PHYSICIAN:  Lance [name]. Lance [name], M.D.DATE OF BIRTH:  11-27-1931  DATE OF PROCEDURE:  02/25/2016 DATE OF DISCHARGE:                              OPERATIVE REPORT   PREOPERATIVE DIAGNOSIS:  Primary osteoarthritis and degenerative joint disease, left knee involving mainly the medial compartment.  POSTOPERATIVE DIAGNOSIS:  Primary osteoarthritis and degenerative joint disease, left knee involving mainly the medial compartment.  PROCEDURE:  Left unicompartmental/partial knee arthroplasty.  IMPLANTS:  Biomet Oxford knee with size medium left femur, size E left tibial tray, 4-mm mobile-bearing polyethylene insert.  SURGEON:  Lance [name]. Lance [name], M.D.  ASSISTANT:  Lance Emery, PA-C.  ANESTHESIA: 1. Adductor canal block, left leg. 2. Spinal.  BLOOD LOSS:  Minimal.  TOURNIQUET TIME:  Just over 1 hour.  COMPLICATIONS:  None.  INDICATIONS:  Lance [name] is an 82 year old gentleman who I have been following for many years now involving debilitating bilateral knee arthritis.  He has mild varus deformities of both knees and osteoarthritis really only involved the medial compartment.  At this point with the failure of all forms of conservative treatment, he wished to proceed with a partial knee arthroplasty and this has been recommended to him as well.  He is a thin individual, his ACL is intact. He has a varus deformity of his knee, that was easily correctable and good range of motion.  Risks and benefits of the surgery were explained to him in detail and he does wish to proceed.  PROCEDURE DESCRIPTION:  After informed consent was obtained, appropriate left leg was marked and Anesthesia obtained an adductor canal block, he was then brought to the operating room, where spinal anesthesia was obtained as well.  He was then  laid supine on the operating table. Foley catheter was placed and a nonsterile tourniquet was placed around his upper left thigh.  His left leg was then prepped and draped with DuraPrep and sterile drapes and the bed was raised, the knee was bent, a sterile stockinette was placed as well.  Time-out was called and he was identified as correct patient and correct left knee.  We then used an Esmarch to wrap out the leg and tourniquet was inflated to 300 mm of pressure.  We then made skin incision just off the medial edge of the patella and carried this down the tibial tubercle.  We performed a medial parapatellar arthrotomy and found a large joint effusion.  We were able to open the joint and found that the disease was mainly in the medial compartment.  There was minimal patellofemoral disease.  His ACL was intact.  There was no lateral disease.  We removed remnants of the medial meniscus and then set our extramedullary cutting guide for the tibia based off the tibial axis as well as using a medium-sizing spoon after we trialed a large and a medium for the femur.  This allowed [place] to make our tibial cut.  We did this with a sagittal and reciprocating saw and removed completely the tibia, so we could size the size E left tibia.  We then went to the femur and  put an intramedullary guide and the notch of the femur and set our sizing guide based off 4 femur.  We made our line down the middle axis of the medial femoral condyle and made our drill cuts for this, so that, we could begin our milling process.  After the first 0 mill, we placed the medium left femoral trial and the E tibia and then we trialed with a size 3 and size 4 insert.  The 3 was just a little bit tight.  Then, we brought the knee up into extension, it was more so tight.  With that being said, we did a 3 final milling for the femur and went through the sizing of 4.  We then made our finishing cuts on the femur and went to the tibia  off for the tibia size E.  we made our keel cut for this.  We then placed the E tibial trial followed by the medium femur and we went up to a 4-mm trial polyethylene insert and we were pleased with range of motion and stability of the knee.  We then removed any osteophytes that we could see from the knee otherwise and remnants of the medial meniscus.  We then mixed our cement and cemented the real size E left tibial tray from Biomet Oxford knee system followed by the real medium femur.  We then let the cement harden and pressurized this with the trial 4 polyethylene insert.  Once the cement had hardened, we removed the trial insert and removed the cement from debris from the knee and then placed the real mobile-bearing size 4-mm insert.  We were pleased with the range of motion and stability after that.  We then irrigated the knee with normal saline solution using pulsatile lavage.  We closed the arthrotomy with interrupted #1 Vicryl suture followed by 0 Vicryl in the deep tissue, 2- 0 Vicryl in the subcutaneous tissue and 4-0 Monocryl subcuticular stitch.  Steri-Strips were then placed on the skin followed by well- padded sterile dressing.  He was then taken off the operating table and taken to the PACU in stable condition.  All final counts were correct. There were no complications noted.  Of note, Lance Emery, PA-C assisted in the entire case, his assistance was crucial for facilitating all aspects of this case.     Lance [name]. Lance [name], M.D.     CYB/MEDQ  D:  02/25/2016  T:  02/26/2016  Job:  [id number]

## 2016-02-26 NOTE — Care Management (Signed)
Spoke with Judson Roch  Again at [facility] [id number], they have received fax and accept referral. Aware discharge is planned for 02-27-16 and patient requesting Georgina Snell . Magdalen Spatz RN BSN [phone]

## 2016-02-26 NOTE — Evaluation (Signed)
Physical Therapy Evaluation Patient Details Name: Lance [name] MRN: [id number] DOB: 1931/10/05 Today's Date: 02/26/2016   History of Present Illness  Pt is an 82 y.o. male now s/p LEFT UNICOMPARTMENTAL KNEE ARTHROPLASTY. PMH:  hypertension, osteopenia, anxiety, depression, lumbar decompression, Rt shoulder surgery.   Clinical Impression  Pt is s/p TKA resulting in the deficits listed below (see PT Problem List).  Pt will benefit from skilled PT to increase their independence and safety with mobility to allow discharge to home with family support. Activity limited due to lethargy during initial session. Pt becoming more alert as session progressed.      Follow Up Recommendations Home health PT;Supervision/Assistance - 24 hour    Equipment Recommendations  None recommended by PT    Recommendations for Other Services       Precautions / Restrictions Precautions Precautions: Knee Precaution Booklet Issued: Yes (comment) Precaution Comments: HEP provided, reviewed knee extension precautions with pt and son.  Restrictions Weight Bearing Restrictions: Yes LLE Weight Bearing: Weight bearing as tolerated      Mobility  Bed Mobility Overal bed mobility: Needs Assistance Bed Mobility: Supine to Sit     Supine to sit: Mod assist     General bed mobility comments: mod assist needed with LLE and trunk to come to sitting EOB. Pt using rail and HOB elevated to assist. Pt leaning to Rt side initially but improving with time and cues.   Transfers Overall transfer level: Needs assistance Equipment used: Rolling walker (2 wheeled) Transfers: Sit to/from Stand Sit to Stand: From elevated surface;Min assist         General transfer comment: Repeating sit/stand X2. Cues needed for hand placement and sequence. Needing cues repeated for understanding.   Ambulation/Gait Ambulation/Gait assistance: Min assist Ambulation Distance (Feet): 3 Feet Assistive device: Rolling walker (2  wheeled) Gait Pattern/deviations: Step-to pattern Gait velocity: decreased   General Gait Details: slow pattern, cues needed for sequence.   Stairs            Wheelchair Mobility    Modified Rankin (Stroke Patients Only)       Balance Overall balance assessment: Needs assistance Sitting-balance support: Bilateral upper extremity supported Sitting balance-Leahy Scale: Poor     Standing balance support: Bilateral upper extremity supported Standing balance-Leahy Scale: Poor Standing balance comment: using rw                             Pertinent Vitals/Pain Pain Assessment: 0-10 Pain Score: 7  Pain Location: Lt knee Pain Descriptors / Indicators: Aching Pain Intervention(s): Limited activity within patient's tolerance;Monitored during session    Home Living Family/patient expects to be discharged to:: Private residence Living Arrangements: Spouse/significant other Available Help at Discharge: Family;Available 24 hours/day Type of Home: House Home Access: Stairs to enter Entrance Stairs-Rails: Right;Left;Can reach both Entrance Stairs-Number of Steps: 2 Home Layout: One level Home Equipment: Walker - 2 wheels;Cane - single point Additional Comments: Pt reports that he will have his children staying with him when he leaves the hospital.     Prior Function Level of Independence: Independent with assistive device(s)         Comments: used cane or rw     Hand Dominance        Extremity/Trunk Assessment   Upper Extremity Assessment: Overall WFL for tasks assessed           Lower Extremity Assessment: LLE deficits/detail   LLE Deficits / Details: requiring assist  to move with bed mobility, unable to perform SLR     Communication   Communication: HOH  Cognition Arousal/Alertness: Lethargic;Suspect due to medications Behavior During Therapy: Flat affect Overall Cognitive Status: Impaired/Different from baseline Area of Impairment:  Following commands               General Comments: Pt with mild confusion, occasionally needing multiple cues and reorientation to task. Family confirms that pt seems more confused than usual    General Comments General comments (skin integrity, edema, etc.): SpO2 89% once sitting after ambulation to chair.     Exercises        Assessment/Plan    PT Assessment Patient needs continued PT services  PT Diagnosis Difficulty walking   PT Problem List Decreased strength;Decreased range of motion;Decreased activity tolerance;Decreased balance;Decreased mobility;Decreased safety awareness  PT Treatment Interventions DME instruction;Gait training;Stair training;Functional mobility training;Therapeutic activities;Therapeutic exercise;Patient/family education   PT Goals (Current goals can be found in the Care Plan section) Acute Rehab PT Goals Patient Stated Goal: go home from the hospital PT Goal Formulation: With patient Time For Goal Achievement: 03/11/16 Potential to Achieve Goals: Good    Frequency 7X/week   Barriers to discharge        Co-evaluation               End of Session Equipment Utilized During Treatment: Gait belt Activity Tolerance: Patient limited by fatigue;Patient limited by lethargy Patient left: in chair;with call bell/phone within reach;with family/visitor present (in knee extension) Nurse Communication: Mobility status;Weight bearing status    Functional Assessment Tool Used: clinical judgment Functional Limitation: Mobility: Walking and moving around Mobility: Walking and Moving Around Current Status [id number]): At least 40 percent but less than 60 percent impaired, limited or restricted Mobility: Walking and Moving Around Goal Status [phone]): At least 20 percent but less than 40 percent impaired, limited or restricted    Time: [id number] PT Time Calculation (min) (ACUTE ONLY): 36 min   Charges:   PT Evaluation $PT Eval Moderate Complexity: 1  Procedure PT Treatments $Therapeutic Activity: 8-22 mins   PT G Codes:   PT G-Codes **NOT FOR INPATIENT CLASS** Functional Assessment Tool Used: clinical judgment Functional Limitation: Mobility: Walking and moving around Mobility: Walking and Moving Around Current Status [id number]): At least 40 percent but less than 60 percent impaired, limited or restricted Mobility: Walking and Moving Around Goal Status [phone]): At least 20 percent but less than 40 percent impaired, limited or restricted    Cassell Clement, PT, [place] Pager [phone] Office [phone]  02/26/2016, 12:04 PM

## 2016-02-26 NOTE — Progress Notes (Signed)
Pt. Wife ate the remainder of his lunch

## 2016-02-26 NOTE — Progress Notes (Signed)
This RN responded to patient's wife call to patient's room since patient was trying to get out of bed to urinate. Patient also removed his left knee immobilizer since it was uncomfortable.  Reoriented patient that he has a Foley Catheter on and do not have to worry to urinate anytime.  Patient noted to be anxious.  Administered PRN medication Valium 10 mg PO for anxiety per order and as per request by patient's wife.  Will monitor closely.Marland Kitchen

## 2016-02-26 NOTE — Progress Notes (Signed)
Physical Therapy Treatment Patient Details Name: Lance [name] MRN: [id number] DOB: 26-Aug-1932 Today's Date: 02/26/2016    History of Present Illness Pt is an 82 y.o. male now s/p LEFT UNICOMPARTMENTAL KNEE ARTHROPLASTY. PMH:  hypertension, osteopenia, anxiety, depression, lumbar decompression, Rt shoulder surgery.     PT Comments    Pt more alert during second PT session. Pt does continue to have mild confusion which family reports as not typical. Ambulation improved to 35 feet with rw. PT to continue to follow in anticipation of D/C to home with family support.   Follow Up Recommendations  Home health PT;Supervision/Assistance - 24 hour     Equipment Recommendations  None recommended by PT    Recommendations for Other Services       Precautions / Restrictions Precautions Precautions: Knee Precaution Comments: reviewed knee extension Restrictions Weight Bearing Restrictions: Yes LLE Weight Bearing: Weight bearing as tolerated    Mobility  Bed Mobility Overal bed mobility: Needs Assistance Bed Mobility: Supine to Sit     Supine to sit: Mod assist     General bed mobility comments: mod assist with LLE  Transfers Overall transfer level: Needs assistance Equipment used: Rolling walker (2 wheeled) Transfers: Sit to/from Stand Sit to Stand: From elevated surface;Min assist         General transfer comment: multiple cues needed for hand placement  Ambulation/Gait Ambulation/Gait assistance: Min guard Ambulation Distance (Feet): 35 Feet Assistive device: Rolling walker (2 wheeled) Gait Pattern/deviations: Step-through pattern;Decreased weight shift to left Gait velocity: decreased   General Gait Details: slow pattern, cues for walker placement during ambulation.    Stairs            Wheelchair Mobility    Modified Rankin (Stroke Patients Only)       Balance Overall balance assessment: Needs assistance Sitting-balance support: No upper  extremity supported Sitting balance-Leahy Scale: Fair     Standing balance support: Bilateral upper extremity supported Standing balance-Leahy Scale: Poor Standing balance comment: using rw                    Cognition Arousal/Alertness: Awake/alert Behavior During Therapy: WFL for tasks assessed/performed Overall Cognitive Status: Impaired/Different from baseline Area of Impairment: Following commands               General Comments: Pt continues with confusion, asking same question multiple times. Reorienting to task.     Exercises Total Joint Exercises Ankle Circles/Pumps: AROM;Both;15 reps Quad Sets: Strengthening;Left (poor activation despite multiple cues. ) Short Arc Quad: Strengthening;Left;10 reps (min assist) Heel Slides: AAROM;Left;10 reps Goniometric ROM: approx 90 degrees Lt knee flexion    General Comments General comments (skin integrity, edema, etc.): SpO2 91% upon return to sitting, ambulating on RA.      Pertinent Vitals/Pain Pain Assessment: 0-10 Pain Score: 7  Pain Location: Lt knee Pain Descriptors / Indicators: Sore Pain Intervention(s): Limited activity within patient's tolerance;Monitored during session    Home Living                      Prior Function            PT Goals (current goals can now be found in the care plan section) Acute Rehab PT Goals Patient Stated Goal: get better PT Goal Formulation: With patient Time For Goal Achievement: 03/11/16 Potential to Achieve Goals: Good Progress towards PT goals: Progressing toward goals    Frequency  7X/week    PT Plan Current plan remains appropriate  Co-evaluation             End of Session Equipment Utilized During Treatment: Gait belt Activity Tolerance: Patient tolerated treatment well Patient left: in chair;with call bell/phone within reach;with family/visitor present (in knee extension)     Time: [id number] PT Time Calculation (min) (ACUTE ONLY):  28 min  Charges:  $Gait Training: 8-22 mins $Therapeutic Exercise: 8-22 mins                    G Codes:      Cassell Clement, PT, CSCS Pager [phone] Office [phone]  02/26/2016, 3:52 PM

## 2016-02-27 ENCOUNTER — Encounter (HOSPITAL_COMMUNITY): Payer: Self-pay | Admitting: Orthopaedic Surgery

## 2016-02-27 DIAGNOSIS — M1712 Unilateral primary osteoarthritis, left knee: Secondary | ICD-10-CM | POA: Diagnosis not present

## 2016-02-27 NOTE — Progress Notes (Signed)
Physical Therapy Treatment Patient Details Name: Lance [name] MRN: [id number] DOB: November 15, 1931 Today's Date: 02/27/2016    History of Present Illness Pt is an 82 y.o. male now s/p LEFT UNICOMPARTMENTAL KNEE ARTHROPLASTY. PMH:  hypertension, osteopenia, anxiety, depression, lumbar decompression, Rt shoulder surgery.     PT Comments    Pt performed increased mobility and ROM this treatment.  Gait remains antalgic and pt required cues for symmetry.  Pt motivated to move and follow directions.   Follow Up Recommendations  Home health PT;Supervision/Assistance - 24 hour     Equipment Recommendations  None recommended by PT    Recommendations for Other Services       Precautions / Restrictions Precautions Precautions: Knee Precaution Booklet Issued: Yes (comment) Precaution Comments: reviewed knee extension Restrictions Weight Bearing Restrictions: Yes LLE Weight Bearing: Weight bearing as tolerated    Mobility  Bed Mobility Overal bed mobility: Needs Assistance Bed Mobility: Sit to Supine       Sit to supine: Mod assist   General bed mobility comments: Pt require mod assist with LLE and movement of upper trunk to position in bed posy gait training.   Transfers Overall transfer level: Needs assistance Equipment used: Rolling walker (2 wheeled) Transfers: Sit to/from Stand (stand to sit observed only as pt was recieved walking in halls with his nurse.  ) Sit to Stand: Min assist (pt required cues for hand placement and eccentric loading.  )         General transfer comment: multiple cues needed for hand placement  Ambulation/Gait Ambulation/Gait assistance: Min guard Ambulation Distance (Feet): 300 Feet (total, PTA started gait training after 1 st 100 feet.  ) Assistive device: Rolling walker (2 wheeled) Gait Pattern/deviations: Step-through pattern;Decreased stride length;Antalgic;Trunk flexed;Decreased step length - right;Decreased stance time - left Gait  velocity: decreased   General Gait Details: slow pattern, cues for walker placement during ambulation. Cues for L heel strike and knee extension in stance phase.     Stairs            Wheelchair Mobility    Modified Rankin (Stroke Patients Only)       Balance Overall balance assessment: Needs assistance Sitting-balance support: No upper extremity supported Sitting balance-Leahy Scale: Fair       Standing balance-Leahy Scale: Poor                      Cognition Arousal/Alertness: Awake/alert Behavior During Therapy: WFL for tasks assessed/performed Overall Cognitive Status: Impaired/Different from baseline Area of Impairment: Following commands                    Exercises Total Joint Exercises Ankle Circles/Pumps: AROM;Both;20 reps;Supine Quad Sets: AROM;Left;10 reps;Supine Heel Slides: AROM;Left;10 reps;Supine Hip ABduction/ADduction: AROM;Left;10 reps;Supine Straight Leg Raises: AROM;Left;10 reps;Supine Long Arc Quad: AROM;Left;10 reps;Seated Goniometric ROM: 11-86 degrees.      General Comments        Pertinent Vitals/Pain Pain Assessment: 0-10 Pain Score: 7  Pain Location: L knee with mobility Pain Descriptors / Indicators: Sore Pain Intervention(s): Limited activity within patient's tolerance;Monitored during session    Home Living                      Prior Function            PT Goals (current goals can now be found in the care plan section) Acute Rehab PT Goals Patient Stated Goal: get better Potential to Achieve Goals: Good Progress towards  PT goals: Progressing toward goals    Frequency  7X/week    PT Plan Current plan remains appropriate    Co-evaluation             End of Session Equipment Utilized During Treatment: Gait belt Activity Tolerance: Patient tolerated treatment well Patient left: with call bell/phone within reach;with family/visitor present;in bed     Time: [id number] PT Time  Calculation (min) (ACUTE ONLY): 31 min  Charges:  $Gait Training: 8-22 mins $Therapeutic Exercise: 8-22 mins                    G Codes:      Cristela Blue 03-14-2016, 3:03 PM  Governor Rooks, PTA pager [phone]

## 2016-02-27 NOTE — Progress Notes (Signed)
While i bathed the pt. I perform foley care

## 2016-02-27 NOTE — Progress Notes (Signed)
Pt. Pulled the Iv out and blood was all over pt.  gown and also on the  floor. Cleaned him up wash and change pts. Gown and bed pad in the  Chair. Linens on the bed pt. Stated that bed did not needed to be changed

## 2016-02-27 NOTE — Progress Notes (Signed)
Subjective: 2 Days Post-Op Procedure(s) (LRB): LEFT UNICOMPARTMENTAL KNEE ARTHROPLASTY (Left) Patient reports pain as moderate to severe.  Nursing stating giving pain meds every hour. Now on 3 liters of oxygen via nasal cannula. Also with some mild confusion per nursing.  Objective: Vital signs in last 24 hours: Temp:  [98.2 F (36.8 C)-100 F (37.8 C)] 98.6 F (37 C) (06/01 0541) Pulse Rate:  [96-106] 97 (06/01 0541) Resp:  [16-20] 18 (06/01 0541) BP: (150-165)/(69-78) 150/69 mmHg (06/01 0541) SpO2:  [95 %-96 %] 96 % (06/01 0541)  Intake/Output from previous day: 05/31 0701 - 06/01 0700 In: 780 [P.O.:780] Out: 2450 [Urine:2450] Intake/Output this shift: Total I/O In: 240 [P.O.:240] Out: 250 [Urine:250]  No results for input(s): HGB in the last 72 hours. No results for input(s): WBC, RBC, HCT, PLT in the last 72 hours. No results for input(s): NA, K, CL, CO2, BUN, CREATININE, GLUCOSE, CALCIUM in the last 72 hours. No results for input(s): LABPT, INR in the last 72 hours.   Awake and alert Sensation intact distally Intact pulses distally Dorsiflexion/Plantar flexion intact Incision: moderate drainage Compartment soft  Assessment/Plan: 2 Days Post-Op Procedure(s) (LRB): LEFT UNICOMPARTMENTAL KNEE ARTHROPLASTY (Left) Up with therapy to chair Change to in patient status Dressing changed  Encourage incentive spirometry,respiratory depression likely due to pain meds.   Syed Zukas 02/27/2016, 12:10 PM

## 2016-02-27 NOTE — Progress Notes (Signed)
Patient ID: Lance [name], male   DOB: [birth year], 82 y.o.   MRN: [id number] Making minimal progress with therapy.  Family not comfortable with taking him home thus far and I agree.  Will likely need short-term skilled nursing.  Will consult Social Work.

## 2016-02-28 DIAGNOSIS — M1712 Unilateral primary osteoarthritis, left knee: Secondary | ICD-10-CM | POA: Diagnosis not present

## 2016-02-28 MED ORDER — WHITE PETROLATUM GEL
Status: AC
  Administered 2016-02-28: 22:00:00
  Filled 2016-02-28: qty 1

## 2016-02-28 NOTE — Clinical Social Work Note (Signed)
CSW spoke to patient, his son Gerald Stabs, and his wife Marcie Bal, they would like to have patient return home with home health.  CSW informed the patient and his family that once he is receiving home health therapy, if they feel patient needs more care at a SNF, the home health agency can arrange to have a social worker help with finding placement from their home.  Patient's wife stated their son will be staying with patient for a couple of weeks after discharge to help out.  CSW also informed patient and his family that based on the distance he is walking insurance company will most likely not approve patient for SNF.  CSW offered to fax patient's info to SNFs, and patient's family said they still are planning to go home with home health.  CSW updated case manager about patient's request.  CSW to sign off please reconsult if other social work needs arise.  Jones Broom. [place], MSW, [place] 02/28/2016 11:31 AM

## 2016-02-28 NOTE — Progress Notes (Signed)
Patient ID: Lance [name], male   DOB: 1932-03-21, 82 y.o.   MRN: [id number] Still with very limited mobility and some confusion.  Left knee is stable.  Vitals are stable.  Will likely need short-term SNF.  Social Work consulted.  Family would like to take him home to hopefully get him into his home environment to improve his memory, but mobility an issue.

## 2016-02-28 NOTE — Progress Notes (Signed)
Physical Therapy Treatment Patient Details Name: Lance [name] MRN: [id number] DOB: [birth year] Today's Date: 02/28/2016    History of Present Illness Pt is an 82 y.o. male now s/p LEFT UNICOMPARTMENTAL KNEE ARTHROPLASTY. PMH:  hypertension, osteopenia, anxiety, depression, lumbar decompression, Rt shoulder surgery.     PT Comments    Pt progressing with therapy will f/u this pm for stair training and progression in HEP to seated therapeutic exercises.    Follow Up Recommendations  Home health PT;Supervision/Assistance - 24 hour     Equipment Recommendations  None recommended by PT    Recommendations for Other Services       Precautions / Restrictions Precautions Precautions: Knee Precaution Booklet Issued: Yes (comment) Precaution Comments: reviewed knee extension Restrictions Weight Bearing Restrictions: Yes LLE Weight Bearing: Weight bearing as tolerated    Mobility  Bed Mobility Overal bed mobility: Needs Assistance Bed Mobility: Sit to Supine     Supine to sit: Min guard     General bed mobility comments: Pt performed with head of bed elevated and required cues to advance to edge of bed.  With increased time pt able to achieve  Transfers Overall transfer level: Needs assistance Equipment used: Rolling walker (2 wheeled) Transfers: Sit to/from Stand Sit to Stand: Min guard         General transfer comment: Cues for sequencing and hand placement and advancement of R LE forward.    Ambulation/Gait Ambulation/Gait assistance: Min guard Ambulation Distance (Feet): 210 Feet Assistive device: Rolling walker (2 wheeled) Gait Pattern/deviations: Step-through pattern;Antalgic;Trunk flexed;Shuffle;Decreased stride length Gait velocity: decreased   General Gait Details: slow pattern, cues for walker placement during ambulation. Cues for L heel strike and knee extension in stance phase.  Cues for upper trunk control.     Stairs            Wheelchair  Mobility    Modified Rankin (Stroke Patients Only)       Balance Overall balance assessment: Needs assistance   Sitting balance-Leahy Scale: Fair       Standing balance-Leahy Scale: Poor                      Cognition Arousal/Alertness: Awake/alert Behavior During Therapy: WFL for tasks assessed/performed Overall Cognitive Status: Impaired/Different from baseline Area of Impairment: Following commands                    Exercises Total Joint Exercises Ankle Circles/Pumps: AROM;Both;20 reps;Supine Quad Sets: AROM;Left;10 reps;Supine Short Arc Quad: Strengthening;Left;10 reps Heel Slides: AROM;Left;10 reps;Supine Hip ABduction/ADduction: AROM;Left;10 reps;Supine Straight Leg Raises: AROM;Left;10 reps;Supine Long Arc Quad: AROM;Left;10 reps;Seated    General Comments        Pertinent Vitals/Pain Pain Assessment: 0-10 Pain Score: 7  Pain Location: L knee with mobility.   Pain Descriptors / Indicators: Sore;Grimacing;Guarding Pain Intervention(s): Limited activity within patient's tolerance;Repositioned;Monitored during session    Home Living                      Prior Function            PT Goals (current goals can now be found in the care plan section) Acute Rehab PT Goals Patient Stated Goal: get better Potential to Achieve Goals: Good Progress towards PT goals: Progressing toward goals    Frequency  7X/week    PT Plan Current plan remains appropriate    Co-evaluation             End of  Session Equipment Utilized During Treatment: Gait belt Activity Tolerance: Patient tolerated treatment well Patient left: with call bell/phone within reach;with family/visitor present;in bed     Time: 1112-1140 PT Time Calculation (min) (ACUTE ONLY): 28 min  Charges:  $Gait Training: 8-22 mins $Therapeutic Exercise: 8-22 mins                    G Codes:      Cristela Blue 03-27-16, 1:56 PM  Governor Rooks, PTA pager  [phone]

## 2016-02-28 NOTE — Progress Notes (Signed)
Physical Therapy Treatment Patient Details Name: Lance [name] MRN: [id number] DOB: Feb 17, 1932 Today's Date: 02/28/2016    History of Present Illness Pt is an 82 y.o. male now s/p LEFT UNICOMPARTMENTAL KNEE ARTHROPLASTY. PMH:  hypertension, osteopenia, anxiety, depression, lumbar decompression, Rt shoulder surgery.     PT Comments    Pt performed PM treatment, more fatigue in pm.  Pt presents with improved ROM.  Will f/u in am for stair training before d/c.    Follow Up Recommendations  Home health PT;Supervision/Assistance - 24 hour     Equipment Recommendations  None recommended by PT    Recommendations for Other Services       Precautions / Restrictions Precautions Precautions: Knee Precaution Booklet Issued: Yes (comment) Precaution Comments: reviewed knee extension Restrictions Weight Bearing Restrictions: Yes LLE Weight Bearing: Weight bearing as tolerated    Mobility  Bed Mobility Overal bed mobility: Needs Assistance Bed Mobility: Sit to Supine     Supine to sit: Min guard     General bed mobility comments: Pt OOB in recliner on arrival.   Transfers Overall transfer level: Needs assistance Equipment used: Rolling walker (2 wheeled) Transfers: Sit to/from Stand Sit to Stand: Supervision         General transfer comment: Cues for sequencing and hand placement and advancement of R LE forward.    Ambulation/Gait Ambulation/Gait assistance: Min assist ( required increased assistance due to fatigue.  ) Ambulation Distance (Feet): 210 Feet Assistive device: Rolling walker (2 wheeled) Gait Pattern/deviations: Step-through pattern;Antalgic;Decreased stride length;Trunk flexed Gait velocity: decreased   General Gait Details: slow pattern, cues for walker placement during ambulation. Cues for L heel strike and knee extension in stance phase.  Cues for upper trunk control.  Cues to step closer in RW.     Stairs            Wheelchair Mobility     Modified Rankin (Stroke Patients Only)       Balance Overall balance assessment: Needs assistance   Sitting balance-Leahy Scale: Fair       Standing balance-Leahy Scale: Poor                      Cognition Arousal/Alertness: Awake/alert Behavior During Therapy: WFL for tasks assessed/performed Overall Cognitive Status: Within Functional Limits for tasks assessed Area of Impairment: Following commands                    Exercises Total Joint Exercises Ankle Circles/Pumps: AROM;Both;20 reps;Supine Quad Sets: AROM;Left;10 reps;Supine Short Arc Quad: Strengthening;Left;10 reps;AAROM Heel Slides: Left;10 reps;Supine;AAROM Hip ABduction/ADduction: Left;10 reps;Supine;AAROM Straight Leg Raises: Left;10 reps;Supine;AAROM Long Arc Quad: AROM;Left;10 reps;Seated Goniometric ROM: 8-91 degrees.     General Comments        Pertinent Vitals/Pain Pain Assessment: 0-10 Pain Score: 8  Pain Location: L knee with mobility. Pain Descriptors / Indicators: Sore;Grimacing;Guarding Pain Intervention(s): Monitored during session;Repositioned    Home Living                      Prior Function            PT Goals (current goals can now be found in the care plan section) Acute Rehab PT Goals Patient Stated Goal: get better Potential to Achieve Goals: Good Progress towards PT goals: Progressing toward goals    Frequency  7X/week    PT Plan Current plan remains appropriate    Co-evaluation  End of Session Equipment Utilized During Treatment: Gait belt Activity Tolerance: Patient tolerated treatment well Patient left: with call bell/phone within reach;with family/visitor present;in bed     Time: [id number] PT Time Calculation (min) (ACUTE ONLY): 28 min  Charges:  $Gait Training: 8-22 mins $Therapeutic Exercise: 8-22 mins                    G Codes:      Cristela Blue 03/04/2016, 5:02 PM  Governor Rooks, PTA pager  [phone]

## 2016-02-29 DIAGNOSIS — M1712 Unilateral primary osteoarthritis, left knee: Secondary | ICD-10-CM | POA: Diagnosis not present

## 2016-02-29 MED ORDER — OXYCODONE-ACETAMINOPHEN 5-325 MG PO TABS: 1.0000 | ORAL_TABLET | Freq: Four times a day (QID) | ORAL | Status: DC | PRN

## 2016-02-29 NOTE — Progress Notes (Signed)
Physical Therapy Treatment Patient Details Name: Lance [name] MRN: [id number] DOB: 02-27-1932 Today's Date: 02/29/2016    History of Present Illness Pt is an 82 y.o. male now s/p LEFT UNICOMPARTMENTAL KNEE ARTHROPLASTY. PMH:  hypertension, osteopenia, anxiety, depression, lumbar decompression, Rt shoulder surgery.     PT Comments    Pt performed with increased mobility and successful completion of stair training.  Pt remains to require step by step instruction to ensure safety.  Pt educated on frequency of HEP.  Pt to d/c this am.    Follow Up Recommendations  Home health PT;Supervision/Assistance - 24 hour     Equipment Recommendations  None recommended by PT    Recommendations for Other Services       Precautions / Restrictions Precautions Precautions: Knee Precaution Booklet Issued: Yes (comment) Precaution Comments: reviewed knee extension Restrictions Weight Bearing Restrictions: Yes LLE Weight Bearing: Weight bearing as tolerated    Mobility  Bed Mobility               General bed mobility comments: Pt OOB in recliner on arrival.   Transfers Overall transfer level: Needs assistance Equipment used: Rolling walker (2 wheeled) Transfers: Sit to/from Stand Sit to Stand: Supervision;Mod assist (mod assist from low seated bench in hall without arm rests otherwise Supervision.  )         General transfer comment: Cues for sequencing and hand placement and advancement of R LE forward.    Ambulation/Gait Ambulation/Gait assistance: Min guard Ambulation Distance (Feet): 160 Feet (x2 trials.  ) Assistive device: Rolling walker (2 wheeled) Gait Pattern/deviations: Step-through pattern;Antalgic;Trunk flexed;Decreased stance time - left Gait velocity: decreased   General Gait Details: slow pattern, cues for walker placement during ambulation. Cues for L heel strike and knee extension in stance phase.  Cues for upper trunk control.  Cues to step closer in RW.      Stairs Stairs: Yes Stairs assistance: Min assist Stair Management: No rails;With walker Number of Stairs: 4 General stair comments: Cues for sequencing and RW placement,  pt performed x1 trial with PTA, on 2nd trial PTA demonstrated technique to wife and on 2rd trial pt performed with wife and cueing from PTA.  Pt and wife have trouble with recall of sequencing so hand out issued and wife reports son will be present for entry into home.    Wheelchair Mobility    Modified Rankin (Stroke Patients Only)       Balance Overall balance assessment: Needs assistance   Sitting balance-Leahy Scale: Fair       Standing balance-Leahy Scale: Poor                      Cognition Arousal/Alertness: Awake/alert Behavior During Therapy: WFL for tasks assessed/performed Overall Cognitive Status: Within Functional Limits for tasks assessed                      Exercises Total Joint Exercises Ankle Circles/Pumps: AROM;Both;20 reps;Supine Quad Sets: AROM;Left;10 reps;Supine Heel Slides: Left;10 reps;Supine;AAROM Hip ABduction/ADduction: Left;10 reps;Supine;AAROM Straight Leg Raises: Left;10 reps;Supine;AAROM Goniometric ROM: 12-93 degrees.      General Comments        Pertinent Vitals/Pain Pain Assessment: 0-10 Pain Score: 8  Pain Location: L knee with mobility Pain Descriptors / Indicators: Sore;Aching;Discomfort;Grimacing;Guarding Pain Intervention(s): Monitored during session;Repositioned    Home Living                      Prior Function  PT Goals (current goals can now be found in the care plan section) Acute Rehab PT Goals Patient Stated Goal: get better Potential to Achieve Goals: Good Progress towards PT goals: Progressing toward goals    Frequency  7X/week    PT Plan Current plan remains appropriate    Co-evaluation             End of Session Equipment Utilized During Treatment: Gait belt Activity Tolerance:  Patient tolerated treatment well Patient left: with call bell/phone within reach;with family/visitor present;in bed     Time: [id number] PT Time Calculation (min) (ACUTE ONLY): 27 min  Charges:  $Gait Training: 8-22 mins $Therapeutic Exercise: 8-22 mins                    G Codes:      Cristela Blue 2016-03-02, 10:18 AM Governor Rooks, PTA pager [phone]

## 2016-02-29 NOTE — Progress Notes (Signed)
Pt discharged to home.  Discharge instructions explained to pt.  Pt has no questions at the time of discharge.  IV removed.  Pt states he has all belongings.  Pt taken off unit in wheelchair by staff.

## 2016-02-29 NOTE — Progress Notes (Signed)
CM confirmed with pt and spouse HHPT arrangements and called [facility] to notify of discharge.  CM called [facility] DME rep, Merry Proud to please deliver the 3n1 so pt can be discharged.  No other CM needs were communicated.

## 2016-02-29 NOTE — Discharge Summary (Signed)
Patient ID: Lance [name] MRN: [id number] DOB/AGE: 1932-07-07 82 y.o.  Admit date: 02/25/2016 Discharge date: 02/29/2016  Admission Diagnoses:  Principal Problem:   Osteoarthritis of left knee Active Problems:   Status post left partial knee replacement   Discharge Diagnoses:  Same  Past Medical History  Diagnosis Date  . Osteoarthritis   . Hypogonadism male   . BPH (benign prostatic hypertrophy) with urinary obstruction   . Hypertension   . Osteopenia   . Vitamin D deficiency   . Anxiety   . Depression   . Diverticulosis   . Colon polyps   . Hypercholesterolemia   . Glaucoma   . Sleep apnea   . Alcohol abuse     Remote hx, stopped in 1970  . OSA on CPAP   . Headache     sinus headaches    Surgeries: Procedure(s): LEFT UNICOMPARTMENTAL KNEE ARTHROPLASTY on 02/25/2016   Consultants:    Discharged Condition: Improved  Hospital Course: Lance [name] is an 82 y.o. male who was admitted 02/25/2016 for operative treatment ofOsteoarthritis of left knee. Patient has severe unremitting pain that affects sleep, daily activities, and work/hobbies. After pre-op clearance the patient was taken to the operating room on 02/25/2016 and underwent  Procedure(s): LEFT UNICOMPARTMENTAL KNEE ARTHROPLASTY.    Patient was given perioperative antibiotics: Anti-infectives    Start     Dose/Rate Route Frequency Ordered Stop   02/26/16 0600  clindamycin (CLEOCIN) IVPB 900 mg     900 mg 100 mL/hr over 30 Minutes Intravenous On call to O.R. 02/25/16 1233 02/25/16 1446   02/25/16 2200  clindamycin (CLEOCIN) IVPB 600 mg     600 mg 100 mL/hr over 30 Minutes Intravenous Every 6 hours 02/25/16 1830 02/26/16 0409       Patient was given sequential compression devices, early ambulation, and chemoprophylaxis to prevent DVT.  Patient benefited maximally from hospital stay and there were no complications.  He needed to stay longer as an inpatient prior to discharge to home to help  with his mobility and due to needing to have improved mental status.  Was back to baseline at discharge.  Recent vital signs: Patient Vitals for the past 24 hrs:  BP Temp Temp src Pulse Resp SpO2  02/29/16 0613 137/69 mmHg 98.2 F (36.8 C) Oral 92 17 93 %  02/28/16 2225 - - - 98 18 95 %  02/28/16 2100 128/64 mmHg (!) 100.5 F (38.1 C) Oral (!) 101 18 95 %  02/28/16 1723 - - - - - 94 %  02/28/16 1411 120/61 mmHg 98.1 F (36.7 C) Oral 91 18 96 %     Recent laboratory studies: No results for input(s): WBC, HGB, HCT, PLT, NA, K, CL, CO2, BUN, CREATININE, GLUCOSE, INR, CALCIUM in the last 72 hours.  Invalid input(s): PT, 2   Discharge Medications:     Medication List    TAKE these medications        acetaminophen 500 MG tablet  Commonly known as:  TYLENOL  Take 500 mg by mouth every 6 (six) hours as needed (For pain.).     amLODipine 5 MG tablet  Commonly known as:  NORVASC  Take 5 mg by mouth daily.     aspirin 325 MG tablet  Take 650 mg by mouth every evening.     buPROPion 150 MG 12 hr tablet  Commonly known as:  WELLBUTRIN SR  Take 150 mg by mouth 2 (two) times daily.     diazepam 10 MG  tablet  Commonly known as:  VALIUM  Take 10 mg by mouth every 8 (eight) hours as needed for anxiety.     doxepin 75 MG capsule  Commonly known as:  SINEQUAN  TAKE ONE CAPSULE BY MOUTH AT BEDTIME     ergocalciferol 50000 units capsule  Commonly known as:  VITAMIN D2  Take 50,000 Units by mouth every Monday.     finasteride 5 MG tablet  Commonly known as:  PROSCAR  Take 5 mg by mouth every morning.     FLOMAX 0.4 MG Caps capsule  Generic drug:  tamsulosin  Take 0.8 mg by mouth every morning.     multivitamin capsule  Take 1 capsule by mouth daily.     MYRBETRIQ 25 MG Tb24 tablet  Generic drug:  mirabegron ER  Take 25 mg by mouth daily.     Omega 3 1000 MG Caps  Take 1,000 mg by mouth 3 (three) times daily.     OVER THE COUNTER MEDICATION  Take 1 tablet by mouth  daily. Niacin Flush Free 300-500mg      oxyCODONE-acetaminophen 5-325 MG tablet  Commonly known as:  PERCOCET/ROXICET  Take 1-2 tablets by mouth every 6 (six) hours as needed for severe pain.     Timolol Maleate PF 0.5 % Soln  Place 1 drop into both eyes every morning.     valsartan 320 MG tablet  Commonly known as:  DIOVAN  Take 320 mg by mouth daily.     vitamin C 500 MG tablet  Commonly known as:  ASCORBIC ACID  Take 500 mg by mouth daily.        Diagnostic Studies: Dg Knee Left Port  02/25/2016  CLINICAL DATA:  Status post left unicompartmental knee arthroplasty EXAM: PORTABLE LEFT KNEE - 1-2 VIEW COMPARISON:  01/29/2016 FINDINGS: Medial unicompartmental arthroplasty without periprosthetic fracture or subluxation. Joint effusion and gas, expected. IMPRESSION: No adverse finding after unicompartmental arthroplasty. Electronically Signed   By: Monte Fantasia M.D.   On: 02/25/2016 17:28    Disposition: 01-Home or Self Care      Discharge Instructions    Discharge patient    Complete by:  As directed            Follow-up Information    Follow up with Mcarthur Rossetti, MD In 2 weeks.   Specialty:  Orthopedic Surgery   Contact information:   [place] [address] [phone]        Signed: TRACIE, SUCHECKI 02/29/2016, 9:48 AM

## 2016-02-29 NOTE — Discharge Instructions (Signed)

## 2016-02-29 NOTE — Progress Notes (Signed)
Subjective: 4 Days Post-Op Procedure(s) (LRB): LEFT UNICOMPARTMENTAL KNEE ARTHROPLASTY (Left) Patient reports pain as moderate.  Doing better overall with therapy and improved mental status.  Family prefers to have him at home.  Can be discahrged today.  Objective: Vital signs in last 24 hours: Temp:  [98.1 F (36.7 C)-100.5 F (38.1 C)] 98.2 F (36.8 C) (06/03 [id number]) Pulse Rate:  [91-101] 92 (06/03 0613) Resp:  [17-18] 17 (06/03 0613) BP: (120-137)/(61-69) 137/69 mmHg (06/03 0613) SpO2:  [93 %-96 %] 93 % (06/03 0613)  Intake/Output from previous day: 06/02 0701 - 06/03 0700 In: 480 [P.O.:480] Out: -  Intake/Output this shift: Total I/O In: 240 [P.O.:240] Out: -   No results for input(s): HGB in the last 72 hours. No results for input(s): WBC, RBC, HCT, PLT in the last 72 hours. No results for input(s): NA, K, CL, CO2, BUN, CREATININE, GLUCOSE, CALCIUM in the last 72 hours. No results for input(s): LABPT, INR in the last 72 hours.  Sensation intact distally Intact pulses distally Dorsiflexion/Plantar flexion intact Incision: scant drainage  Assessment/Plan: 4 Days Post-Op Procedure(s) (LRB): LEFT UNICOMPARTMENTAL KNEE ARTHROPLASTY (Left) Up with therapy Discharge home with home health today.  Lance [name],Dugan Y 02/29/2016, 9:41 AM

## 2016-03-02 ENCOUNTER — Other Ambulatory Visit: Payer: Self-pay | Admitting: Physician Assistant

## 2016-03-12 ENCOUNTER — Telehealth: Payer: Self-pay | Admitting: Neurology

## 2016-03-12 NOTE — Telephone Encounter (Signed)
Patient's wife is calling. The patient has a BIPAP and has an appointment in our office on 04-20-16. She would like to discuss the BIPAP machine because of it being so powerful for the patient.

## 2016-03-12 NOTE — Telephone Encounter (Signed)
I spoke to pt's wife. Pt's wife reports that pt is frustrated with his bipap machine because it is "too powerful". He was in the hospital recently for a surgery and the hospital bipap worked fine for him. I advised pt's wife that I would send this message to Shirlean Mylar, sleep lab manager to review. Pt's wife reports that she has called [facility] several times but they won't give pt another appt because they "are doing everything right so an appt is not needed.".

## 2016-03-13 ENCOUNTER — Telehealth: Payer: Self-pay

## 2016-03-13 DIAGNOSIS — G4733 Obstructive sleep apnea (adult) (pediatric): Secondary | ICD-10-CM

## 2016-03-13 DIAGNOSIS — Z9989 Dependence on other enabling machines and devices: Principal | ICD-10-CM

## 2016-03-13 NOTE — Telephone Encounter (Signed)
Patient came for mask fitting. He has high leak on Bipap 23/18. I attempted several full face mask and all of them had high leak. Turned machine down to 20/16. Attempted again with several. The Res Med Quattro Fx medium works great. 2 leak. Patient said he used this mask 1st time and did fine with it but was changed over to AIR FIT 20. He has a wide mouth and the mask needs a wider fit. The Quattro has that wider fit. It was a higher leak on higher settings but with the 20/16 it was much better. Can we update [facility] on mask choice. Dr. Brett Fairy do you think we could go down on pressure and check download? Thanks

## 2016-03-13 NOTE — Telephone Encounter (Signed)
All in favor of pressure reduction if tolerated, 20/16 with a FFM of wide fit. REsMEd quattro in medium FFM , or one of the new foam padded ones? CD

## 2016-03-16 NOTE — Telephone Encounter (Signed)
Order sent to [facility] °

## 2016-04-02 ENCOUNTER — Telehealth: Payer: Self-pay | Admitting: Neurology

## 2016-04-02 NOTE — Telephone Encounter (Signed)
Spoke to pt's wife. She wants Robin, sleep lab manager to look at pt's therapy report because pt is "still having problems with the leak." I advised pt's wife that Shirlean Mylar will return on Monday and I will ask her to look at it then. Pt's wife verbalized understanding.

## 2016-04-02 NOTE — Telephone Encounter (Signed)
Pt's wife called in and says they are not getting consistent readings while on Bipap. She is concerned and would like for him to be looked at again.  Please call and advise [phone]

## 2016-04-06 ENCOUNTER — Telehealth: Payer: Self-pay

## 2016-04-06 DIAGNOSIS — G4733 Obstructive sleep apnea (adult) (pediatric): Secondary | ICD-10-CM

## 2016-04-06 NOTE — Telephone Encounter (Signed)
Order sent to [facility] °

## 2016-04-06 NOTE — Telephone Encounter (Signed)
Talked with patients wife regarding her husbands Bipap. He is still struggling with the pressure. He has a good seal with mask when he goes to bed but ends up with an 80 leak. Once the pressure goes up from ramp he is adjusting mask and causing leak. His download shows an AHI of 2.8 on Bipap 20/16. Can we go down to Bipap 16/12 and see if this will help patient. We can repeat a download afterwards.

## 2016-04-06 NOTE — Telephone Encounter (Addendum)
Pt called in because she has not heard from the sleep lab. Pt is still having problems with machine. Please call and advise [phone]

## 2016-04-20 NOTE — Addendum Note (Signed)
Addended by: Lester [place] A on: 04/20/2016 10:03 AM   Modules accepted: Orders

## 2016-04-27 ENCOUNTER — Encounter: Payer: Self-pay | Admitting: Neurology

## 2016-04-27 ENCOUNTER — Ambulatory Visit (INDEPENDENT_AMBULATORY_CARE_PROVIDER_SITE_OTHER): Payer: Medicare Other | Admitting: Neurology

## 2016-04-27 VITALS — BP 138/78 | HR 96 | Resp 20 | Ht 72.0 in | Wt 213.0 lb

## 2016-04-27 DIAGNOSIS — G3184 Mild cognitive impairment, so stated: Secondary | ICD-10-CM | POA: Diagnosis not present

## 2016-04-27 DIAGNOSIS — G4733 Obstructive sleep apnea (adult) (pediatric): Secondary | ICD-10-CM | POA: Diagnosis not present

## 2016-04-27 NOTE — Progress Notes (Signed)
SLEEP MEDICINE CLINIC   Provider:  Larey Seat, M D  Referring Provider: Crist Infante, MD Primary Care Physician:  Jerlyn Ly, MD   Patient arrived 20 minutes after requested arrival time of 12.30 . Paper work not completed by 13.30 hours.    Chief Complaint  Patient presents with  . Follow-up    new cpap, memory    HPI:  Lance [name] is a 82 y.o. male , seen here as a referral  from Dr. 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 I saw him last 5 years ago when he underwent a sleep study and he was placed on CPAP. His nocturnal polysomnography took place on 11/03/2010 upon referral of Dr. Crist Infante. He slept 381 minutes with an AHI of 31.300% of the night in supine sleep position. His REM sleep AHI was 48.9. Oxygen nadir was 76% with 143 minutes of desaturation time in total. For this reason a CPAP titration was recommended. The patient was treated for pneumonia as of December 2016 in [place]. Upon return Dr. Joylene Draft ordered an overnight pulse oximetry. This was performed by adult and pediatric specialists of [facility] and performed without CPAP use. On room air only the total oxygen desaturation time was 60 minutes and 8 seconds the largest the longest continuous.  Was 14 minutes and 24 seconds and seems to be artifact ( based on the shape of the curve, he was moving ). Oxygen desaturation index was 41.68. The patient had tachycardia and bradycardia at night his heart rate becoming slower as the sleep progressed.  Mrs. [name] reports that when her husband does not use CPAP he is sleepy throughout the coming day.The patient himself feels that he sleep sounder without the CPAP.   Unfortunately there are no data to be retrieved from his machine today there is no memory chip placed. He didn't qualify for oxygen supplement based on the overnight pulse oximetry of CPAP. Since he did have a pneumonia just weeks before this  test was performed is also not clear if this is a constant condition. What I would like to do is to reevaluate him for his current CPAP need and possibly re-titrate.  He would also be able to obtain an newer CPAP machine that could be WiFi  connected this way he would not have to repeat sleep studies in the future and we can access data of compliance through the Internet   Chief complaint according to patient : Lance [name] physically fatigued and weak now in the form of muscular heaviness and inability to ambulate and participate in social functions as he would like to. He is using a walker now. An MRI of his spine is pending scheduled for tomorrow. It will be important to see if he has spinal stenosis- the patient is treated for leg pain, he does not suffer any  back pain. He received steroid injections into both knee joints but only found temporary relief. But he indicated that first he had some knee base pain he now feels that the lateral knee Is affected and that the pain area has ascended into the thigh. Hyaluronic acid injections were not successful. He has been unable to undergo knee surgery  ( Dr. Rush Farmer)  as the true reason for his weakness has not been established. When he travelled to [place] , he used a Radio producer, now he needs a walker. He has not fallen.  He denies periphaeral neuropathic symptoms.  Interval history from 04/27/2016. As the pleasure of meeting the [name] today again Lance [name] underwent a Pap titration on 01/23/2016 which ended up to require BiPAP. His baseline AHI was 45.6 per hour of sleep with 182 minutes of oxygen desaturation. Sleep efficiency was 81.6%. BiPAP at 23/18 was the most effective but difficult to tolerate the AHI was 0.0 with an oxygen nadir of 91. In the meantime we have reduced the pressure based on the patient's report 217/13 and his residual AHI is 3.9, certainly not a bad result. However he has a lot of ear leaks that wake him and his wife at  night. I exchanged his interface today from a mirage quattro to a [employer]. He only sleeps 4 hours now !  I will bring the patient over to the sleep lab for that we can train him how to put the mask on when in bed. This may be accounted for gravity changes. The patient also underwent today a [place] cognitive assessment and he scored 23 out of 30 points. He was able to recall 4 out of 5 words. He did have some problems today with the serial 7 subtraction he was able to draw a cube, a clock face and name all 3 animals.   Sleep habits are as follows:  The patient reports that he usually goes to bed between 11 and midnight, he falls asleep promptly. He will stay asleep for about 6 or 7 hours he may have one bathroom break in the early morning hours and goes easily to sleep again. Nocturnal sleep time is usually 7-8 hours He naps in daytime also he does not necessarly schedule his naps. His naps last one hour.   Social history: married, 2 children.   Review of Systems: Out of a complete 14 system review, the patient complains of only the following symptoms, and all other reviewed systems are negative. Walker dependent ambulation, difficulties to walk just from one room to another.  Walking became difficult at the time of the pneumonia.   Epworth score  9 , Fatigue severity score 39  , depression score 4   Social History   Social History  . Marital status: Married    Spouse name: N/A  . Number of children: 3  . Years of education: N/A   Occupational History  . retired Retired   Social History Main Topics  . Smoking status: Former Smoker    Quit date: 09/28/1962  . Smokeless tobacco: Never Used  . Alcohol use No  . Drug use: No  . Sexual activity: Not on file   Other Topics Concern  . Not on file   Social History Narrative  . No narrative on file    No family history on file.  Past Medical History:  Diagnosis Date  . Alcohol abuse    Remote hx, stopped in 1970    . Anxiety   . BPH (benign prostatic hypertrophy) with urinary obstruction   . Colon polyps   . Depression   . Diverticulosis   . Glaucoma   . Headache    sinus headaches  . Hypercholesterolemia   . Hypertension   . Hypogonadism male   . OSA on CPAP   . Osteoarthritis   . Osteopenia   . Sleep apnea   . Vitamin D deficiency     Past Surgical History:  Procedure Laterality Date  . APPENDECTOMY  1945  . BACK SURGERY    . EYE SURGERY     bilateral  cataracts  . LUMBAR LAMINECTOMY/DECOMPRESSION MICRODISCECTOMY Bilateral 11/11/2015   Procedure: Bilateral L4-5 Laminectomy;  Surgeon: Kary Kos, MD;  Location: [facility];  Service: Neurosurgery;  Laterality: Bilateral;  Bilateral L4-5 Laminectomy  . PARTIAL KNEE ARTHROPLASTY Left 02/25/2016   Procedure: LEFT UNICOMPARTMENTAL KNEE ARTHROPLASTY;  Surgeon: Mcarthur Rossetti, MD;  Location: [facility];  Service: Orthopedics;  Laterality: Left;  . REPLACEMENT UNICONDYLAR JOINT KNEE Left 02/25/2016  . SHOULDER SURGERY Right   . TONSILLECTOMY      Current Outpatient Prescriptions  Medication Sig Dispense Refill  . acetaminophen (TYLENOL) 500 MG tablet Take 500 mg by mouth every 6 (six) hours as needed (For pain.).    Marland Kitchen amLODipine (NORVASC) 5 MG tablet Take 5 mg by mouth daily.  8  . aspirin 325 MG tablet Take 650 mg by mouth every evening.     Marland Kitchen buPROPion (WELLBUTRIN SR) 150 MG 12 hr tablet Take 150 mg by mouth 2 (two) times daily.    . diazepam (VALIUM) 10 MG tablet Take 10 mg by mouth every 8 (eight) hours as needed for anxiety.     Marland Kitchen doxepin (SINEQUAN) 75 MG capsule TAKE ONE CAPSULE BY MOUTH AT BEDTIME 30 capsule 0  . ergocalciferol (VITAMIN D2) 50000 UNITS capsule Take 50,000 Units by mouth every Monday.     . finasteride (PROSCAR) 5 MG tablet Take 5 mg by mouth every morning.     . mirabegron ER (MYRBETRIQ) 25 MG TB24 tablet Take 25 mg by mouth daily.    . Multiple Vitamin (MULTIVITAMIN) capsule Take 1 capsule by mouth daily.     .  OMEGA 3 1000 MG CAPS Take 1,000 mg by mouth 3 (three) times daily.     Marland Kitchen OVER THE COUNTER MEDICATION Take 1 tablet by mouth daily. Niacin Flush Free 300-500mg     . Tamsulosin HCl (FLOMAX) 0.4 MG CAPS Take 0.8 mg by mouth every morning.    . Timolol Maleate PF 0.5 % SOLN Place 1 drop into both eyes every morning.    . traMADol (ULTRAM) 50 MG tablet Take by mouth every 12 (twelve) hours as needed.    . valsartan (DIOVAN) 320 MG tablet Take 320 mg by mouth daily.  11  . vitamin C (ASCORBIC ACID) 500 MG tablet Take 500 mg by mouth daily.    Marland Kitchen oxyCODONE-acetaminophen (PERCOCET/ROXICET) 5-325 MG tablet Take 1-2 tablets by mouth every 6 (six) hours as needed for severe pain. (Patient not taking: Reported on 04/27/2016) 60 tablet 0   No current facility-administered medications for this visit.     Allergies as of 04/27/2016 - Review Complete 04/27/2016  Allergen Reaction Noted  . Penicillins Anaphylaxis, Hives, and Other (See Comments) 10/26/2011  . Simvastatin Other (See Comments) 10/22/2015    Vitals: BP 138/78   Pulse 96   Resp 20   Ht 6' (1.829 m)   Wt 213 lb (96.6 kg)   BMI 28.89 kg/m  Last Weight:  Wt Readings from Last 1 Encounters:  04/27/16 213 lb (96.6 kg)   [id number] mass index is 28.89 kg/m.     Last Height:   Ht Readings from Last 1 Encounters:  04/27/16 6' (1.829 m)    Physical exam:  General: The patient is awake, alert and appears not in acute distress. The patient is well groomed. Head: Normocephalic, atraumatic. Neck is supple. Mallampati 4 ,  neck circumference:17.5 . Nasal airflow unrestricted , TMJ click is noted ,no Retrognathia is seen.  Cardiovascular:  Regular rate and rhythm,  without  murmurs or carotid bruit, and without distended neck veins. Respiratory: Lungs are clear to auscultation. Skin:  Without evidence of edema, or rash    Neurologic exam : The patient is awake and alert, oriented to place and time.   Memory subjective  described as intact.    Memory testing deferred.  MOCA: Montreal Cognitive Assessment  04/27/2016  Visuospatial/ Executive (0/5) 4  Naming (0/3) 3  Attention: Read list of digits (0/2) 1  Attention: Read list of letters (0/1) 1  Attention: Serial 7 subtraction starting at 100 (0/3) 1  Language: Repeat phrase (0/2) 2  Language : Fluency (0/1) 0  Abstraction (0/2) 2  Delayed Recall (0/5) 4  Orientation (0/6) 5  Total 23       Attention span & concentration ability appears normal.  Speech is fluent,  with  dysphonia , hoarseness. Mood and affect are appropriate.  Cranial nerves: Pupils are equal and briskly reactive to light. Funduscopic exam without evidence of pallor or edema.  Extraocular movements  in vertical and horizontal planes are smoth and  intact and without nystagmus.  Visual [name] by finger perimetry are intact. Hearing to finger rub intact.- with hearing aids.   Facial sensation intact to fine touch. Facial motor strength is symmetric and tongue and uvula move midline. Shoulder shrug was symmetrical.     Assessment:  After physical and neurologic examination, review of laboratory studies,  Personal review of imaging studies, reports of other /same  Imaging studies ,  Results of polysomnography/ neurophysiology testing and pre-existing records as far as provided in visit., my assessment is   1) OSA - on CPAP- had no therapeutic data available. DME advanced home care. Will change to FFM on BiPAP 17 over 13 cm water. Good AHI. I believe the compliance will improve once the patient does not have to suffer as many air leaks.  Plan:  Treatment plan and additional workup :  Rv in 6 moth with Np.     cc Dr Joylene Draft, MD     Larey Seat MD  04/27/2016   CC: Crist Infante, [place],  [id number]

## 2016-04-27 NOTE — Patient Instructions (Signed)
Mild Neurocognitive Disorder Mild neurocognitive disorder (formerly known as mild cognitive impairment) is a mental disorder. It is a slight abnormal decrease in mental function. The areas of mental function affected may include memory, thought, communication, behavior, and completion of tasks. The decrease is noticeable and measurable but for the most part does not interfere with your daily activities. Mild neurocognitive disorder typically occurs in people older than 60 years but can occur earlier. It is not as serious as major neurocognitive disorder (formerly known as dementia) but may lead to a more serious neurocognitive disorder. However, in some cases the condition does not get worse. A few people with this disorder even improve. CAUSES  There are a number of different causes of mild neurocognitive disorder:   Brain disorders associated with abnormal protein deposits, such as Alzheimer's disease, Pick's disease, and Lewy body disease.  Brain disorders associated with abnormal movement, such as Parkinson's disease and Huntington's disease.  Diseases affecting blood vessels in the brain and resulting in mini-strokes.  Certain infections, such as human immunodeficiency virus (HIV) infection.  Traumatic brain injury.  Other medical conditions such as brain tumors, underactive thyroid (hypothyroidism), and vitamin B12 deficiency.  Use of certain prescription medicine and "recreational" drugs. SYMPTOMS  Symptoms of mild neurocognitive disorder include:  Difficulty remembering. You may forget details of recent events, names, or phone numbers. You may forget important social events and appointments or repeatedly forget where you put your car keys.  Difficulty thinking and solving problems. You may have trouble with complex tasks such as paying bills or driving in unfamiliar locations.  Difficulty communicating. You may have trouble finding the right word, naming an object, forming a  sentence that makes sense, or understanding what you read or hear.  Changes in your behavior or personality. You may lose interest in the things that you used to enjoy or withdraw from social situations. You may get angry more easily than usual. You may act before thinking. You may do things in public that you would not usually do. You may hear or see things that are not real (hallucinations). You may believe falsely that others are trying to hurt you (paranoia). DIAGNOSIS Mild neurocognitive disorder is diagnosed through an assessment by your health care provider. Your health care provider will ask you and your family, friends, or coworkers questions about your symptoms. He or she will ask how often the symptoms occur, how long they have been occurring, whether they are getting worse, and the effect they are having on your life. Your health care provider may refer you to a neurologist or mental health specialist for a detailed evaluation of your mental functions (neuropsychological testing).  To identify the cause of your mild neurocognitive disorder, your health care provider may:  Obtain a detailed medical history.  Ask about alcohol and drug use, including prescription medicine.  Perform a physical exam.  Order blood tests and brain imaging exams. TREATMENT  Mild neurocognitive disorder caused by infections, use of certain medicines or "recreational" drugs, and certain medical conditions may improve with treatment of the condition that is causing the disorder. Mild neurocognitive disorder resulting from other causes generally does not improve and may worsen. In these cases, the goal of treatment is to slow progression of the disorder and help you cope with the loss of mental function. Treatments in these cases include:   Medicine. Medicine helps mainly with memory loss and behavioral symptoms.   Talk therapy. Talk therapy provides education, emotional support, memory aids, and other   ways of  making up for decreases in mental function.   Lifestyle changes. These include regular exercise, a healthy diet (including essential omega-3 fatty acids), intellectual stimulation, and increased social interaction.   This information is not intended to replace advice given to you by your health care provider. Make sure you discuss any questions you have with your health care provider.   Document Released: 05/17/2013 Document Revised: 10/05/2014 Document Reviewed: 05/17/2013 Elsevier Interactive Patient Education 2016 Elsevier Inc.  

## 2016-05-04 NOTE — Addendum Note (Signed)
Addended by: Lester Glenrock A on: 05/04/2016 12:16 PM   Modules accepted: Orders

## 2016-05-19 ENCOUNTER — Telehealth: Payer: Self-pay | Admitting: Neurology

## 2016-05-19 NOTE — Telephone Encounter (Signed)
Pt's wife called sts [facility] told her that [facility] will not pay if he does not use 4 hrs/night. She said she has been on the phone for 3 hrs with [facility], [facility] and PCP trying to get an extension. She was told by Shirlean Mylar in our sleep lab recommended that she has a mask the pt should try and she will leave for her to pick up. [facility] told her today that the machine will have to be turned in.  Please call to clarify what needs to be done.

## 2016-05-20 NOTE — Telephone Encounter (Signed)
I called pt to discuss. No answer, left a message asking pt to call me back. 

## 2016-05-20 NOTE — Telephone Encounter (Signed)
I spent greater than 11 minutes on the phone with pt's wife discussing this.  Pt's wife reports that [facility] is forcing pt to turn in cpap for non compliance. He is only at 53% compliance today when I viewed his therapy report. Pt's wife reports that pt "really needs the cpap" but he just has had a hard time with the pressures and masks. We have already provided pt with several masks and pressure changes and our sleep lab manager has worked with pt to get him comfortable with the cpap.  Pt has 3 options per [facility]. -turn in cpap machine -private pay for cpap machine -start whole process over, including a new sleep study  I advised pt's wife repeatedly that there is not anything we can do on our end to change [facility]'s mind regarding this. I suggested pt's wife call [facility] and request an extension for pt's compliance. Pt's wife says that she already spent 3 hours on the phone yesterday with [facility] and they will not grant them another extension.  I advised her that I would need to talk to Dr. Brett Fairy and find out what she prefers we do.

## 2016-05-20 NOTE — Telephone Encounter (Signed)
Yes, if he tolerates CPAP and will use it more, by all means get back on it.

## 2016-05-20 NOTE — Telephone Encounter (Signed)
I spoke to Dr. Brett Fairy and she agrees that if [facility] is making pt return his bipap for non-compliance, then he must do so or pay out of pocket for it, but starting the whole process over, including the sleep study, is not feasible.  I spoke to pt's wife and and advised her of this. She says that she will return the pt's bipap and pt will return to using cpap. A follow up appt has been made for pt on 07/06/16.  Dr. Brett Fairy, this is a follow up from our conversation. Since pt has to return his bipap for non-compliance, pt's wife wants him to start using his cpap again, which she says that he does not have a problem using (he has one at home). Is this ok?

## 2016-05-21 NOTE — Telephone Encounter (Signed)
I spoke to pt's wife. She says that they have already turned in pt's bipap and he has resumed cpap. I advised pt's wife that Dr. Brett Fairy is ok with pt resuming his cpap. Pt's wife verbalized understanding.

## 2016-07-06 ENCOUNTER — Encounter: Payer: Self-pay | Admitting: Neurology

## 2016-07-06 ENCOUNTER — Ambulatory Visit (INDEPENDENT_AMBULATORY_CARE_PROVIDER_SITE_OTHER): Payer: Medicare Other | Admitting: Neurology

## 2016-07-06 VITALS — BP 138/82 | HR 80 | Resp 20 | Ht 72.0 in | Wt 215.0 lb

## 2016-07-06 DIAGNOSIS — Z9989 Dependence on other enabling machines and devices: Secondary | ICD-10-CM | POA: Diagnosis not present

## 2016-07-06 DIAGNOSIS — G4733 Obstructive sleep apnea (adult) (pediatric): Secondary | ICD-10-CM

## 2016-07-06 DIAGNOSIS — G308 Other Alzheimer's disease: Secondary | ICD-10-CM | POA: Diagnosis not present

## 2016-07-06 DIAGNOSIS — F0280 Dementia in other diseases classified elsewhere without behavioral disturbance: Secondary | ICD-10-CM

## 2016-07-06 NOTE — Progress Notes (Signed)
SLEEP MEDICINE CLINIC   Provider:  Larey Seat, M D  Referring Provider: Crist Infante, MD Primary Care Physician:  Jerlyn Ly, MD   Patient arrived 20 minutes after requested arrival time of 12.30 . Paper work not completed by 13.30 hours.    Chief Complaint  Patient presents with  . Follow-up    bipap was turned in for non-compliance, pt did not bring cpap chip    HPI:  Lance [name] is a 82 y.o. male , seen here as a referral  from Dr. 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 I saw him last 5 years ago when he underwent a sleep study and he was placed on CPAP. His nocturnal polysomnography took place on 11/03/2010 upon referral of Dr. Crist Infante. He slept 381 minutes with an AHI of 31.300% of the night in supine sleep position. His REM sleep AHI was 48.9. Oxygen nadir was 76% with 143 minutes of desaturation time in total. For this reason a CPAP titration was recommended. The patient was treated for pneumonia as of December 2016 in [place]. Upon return Dr. Joylene Draft ordered an overnight pulse oximetry. This was performed by adult and pediatric specialists of [facility] and performed without CPAP use. On room air only the total oxygen desaturation time was 60 minutes and 8 seconds the largest the longest continuous.  Was 14 minutes and 24 seconds and seems to be artifact ( based on the shape of the curve, he was moving ). Oxygen desaturation index was 41.68. The patient had tachycardia and bradycardia at night his heart rate becoming slower as the sleep progressed.  Lance [name] reports that when her husband does not use CPAP he is sleepy throughout the coming day.The patient himself feels that he sleep sounder without the CPAP.   Unfortunately there are no data to be retrieved from his machine today there is no memory chip placed. He didn't qualify for oxygen supplement based on the overnight pulse oximetry of CPAP. Since  he did have a pneumonia just weeks before this test was performed is also not clear if this is a constant condition. What I would like to do is to reevaluate him for his current CPAP need and possibly re-titrate.  He would also be able to obtain an newer CPAP machine that could be WiFi  connected this way he would not have to repeat sleep studies in the future and we can access data of compliance through the Internet   Chief complaint according to patient : Lance [name] fields physically fatigued and weak now in the form of muscular heaviness and inability to ambulate and participate in social functions as he would like to. He is using a walker now. An MRI of his spine is pending scheduled for tomorrow. It will be important to see if he has spinal stenosis- the patient is treated for leg pain, he does not suffer any  back pain. He received steroid injections into both knee joints but only found temporary relief. But he indicated that first he had some knee base pain he now feels that the lateral knee Is affected and that the pain area has ascended into the thigh. Hyaluronic acid injections were not successful. He has been unable to undergo knee surgery  ( Dr. Rush Farmer)  as the true reason for his weakness has not been established. When he travelled to [place] , he used a Radio producer, now he needs a walker. He  has not fallen.  He denies periphaeral neuropathic symptoms.   Interval history from 04/27/2016. As the pleasure of meeting the [facility] today again Mr. Lance [name] underwent a Pap titration on 01/23/2016 which ended up to require BiPAP. His baseline AHI was 45.6 per hour of sleep with 182 minutes of oxygen desaturation. Sleep efficiency was 81.6%. BiPAP at 23/18 was the most effective but difficult to tolerate the AHI was 0.0 with an oxygen nadir of 91. In the meantime we have reduced the pressure based on the patient's report 217/13 and his residual AHI is 3.9, certainly not a bad result. However he has a  lot of ear leaks that wake him and his wife at night. I exchanged his interface today from a mirage quattro to a [employer]. He only sleeps 4 hours now !  I will bring the patient over to the sleep lab for that we can train him how to put the mask on when in bed. This may be accounted for gravity changes. The patient also underwent today a [place] cognitive assessment and he scored 23 out of 30 points. He was able to recall 4 out of 5 words. He did have some problems today with the serial 7 subtraction he was able to draw a cube, a clock face and name all 3 animals.   Sleep habits are as follows:  The patient reports that he usually goes to bed between 11 and midnight, he falls asleep promptly. He will stay asleep for about 6 or 7 hours he may have one bathroom break in the early morning hours and goes easily to sleep again. Nocturnal sleep time is usually 7-8 hours He naps in daytime also he does not necessarly schedule his naps. His naps last one hour.   Social history: married, 2 children.   Review of Systems: Out of a complete 14 system review, the patient complains of only the following symptoms, and all other reviewed systems are negative. Walker dependent ambulation, difficulties to walk just from one room to another.  Walking became difficult at the time of the pneumonia.   Epworth score  9 , Fatigue severity score 39  , depression score 4   Social History   Social History  . Marital status: Married    Spouse name: N/A  . Number of children: 3  . Years of education: N/A   Occupational History  . retired Retired   Social History Main Topics  . Smoking status: Former Smoker    Quit date: 09/28/1962  . Smokeless tobacco: Never Used  . Alcohol use No  . Drug use: No  . Sexual activity: Not on file   Other Topics Concern  . Not on file   Social History Narrative  . No narrative on file    No family history on file.  Past Medical History:  Diagnosis Date    . Alcohol abuse    Remote hx, stopped in 1970  . Anxiety   . BPH (benign prostatic hypertrophy) with urinary obstruction   . Colon polyps   . Depression   . Diverticulosis   . Glaucoma   . Headache    sinus headaches  . Hypercholesterolemia   . Hypertension   . Hypogonadism male   . OSA on CPAP   . Osteoarthritis   . Osteopenia   . Sleep apnea   . Vitamin D deficiency     Past Surgical History:  Procedure Laterality Date  . APPENDECTOMY  1945  . BACK SURGERY    .  EYE SURGERY     bilateral cataracts  . LUMBAR LAMINECTOMY/DECOMPRESSION MICRODISCECTOMY Bilateral 11/11/2015   Procedure: Bilateral L4-5 Laminectomy;  Surgeon: Kary Kos, MD;  Location: [facility];  Service: Neurosurgery;  Laterality: Bilateral;  Bilateral L4-5 Laminectomy  . PARTIAL KNEE ARTHROPLASTY Left 02/25/2016   Procedure: LEFT UNICOMPARTMENTAL KNEE ARTHROPLASTY;  Surgeon: Mcarthur Rossetti, MD;  Location: [facility];  Service: Orthopedics;  Laterality: Left;  . REPLACEMENT UNICONDYLAR JOINT KNEE Left 02/25/2016  . SHOULDER SURGERY Right   . TONSILLECTOMY      Current Outpatient Prescriptions  Medication Sig Dispense Refill  . acetaminophen (TYLENOL) 500 MG tablet Take 500 mg by mouth every 6 (six) hours as needed (For pain.).    Marland Kitchen amLODipine (NORVASC) 5 MG tablet Take 5 mg by mouth daily.  8  . aspirin 325 MG tablet Take 650 mg by mouth every evening.     Marland Kitchen buPROPion (WELLBUTRIN SR) 150 MG 12 hr tablet Take 150 mg by mouth 2 (two) times daily.    . diazepam (VALIUM) 10 MG tablet Take 10 mg by mouth every 8 (eight) hours as needed for anxiety.     Marland Kitchen doxepin (SINEQUAN) 75 MG capsule TAKE ONE CAPSULE BY MOUTH AT BEDTIME 30 capsule 0  . ergocalciferol (VITAMIN D2) 50000 UNITS capsule Take 50,000 Units by mouth every Monday.     . finasteride (PROSCAR) 5 MG tablet Take 5 mg by mouth every morning.     . mirabegron ER (MYRBETRIQ) 25 MG TB24 tablet Take 25 mg by mouth daily.    . Multiple Vitamin  (MULTIVITAMIN) capsule Take 1 capsule by mouth daily.     . OMEGA 3 1000 MG CAPS Take 1,000 mg by mouth 3 (three) times daily.     Marland Kitchen OVER THE COUNTER MEDICATION Take 1 tablet by mouth daily. Niacin Flush Free 300-500mg     . Tamsulosin HCl (FLOMAX) 0.4 MG CAPS Take 0.8 mg by mouth every morning.    . Timolol Maleate PF 0.5 % SOLN Place 1 drop into both eyes every morning.    . traMADol (ULTRAM) 50 MG tablet Take by mouth every 12 (twelve) hours as needed.    . valsartan (DIOVAN) 320 MG tablet Take 320 mg by mouth daily.  11  . vitamin C (ASCORBIC ACID) 500 MG tablet Take 500 mg by mouth daily.     No current facility-administered medications for this visit.     Allergies as of 07/06/2016 - Review Complete 07/06/2016  Allergen Reaction Noted  . Penicillins Anaphylaxis, Hives, and Other (See Comments) 10/26/2011  . Simvastatin Other (See Comments) 10/22/2015    Vitals: BP 138/82   Pulse 80   Resp 20   Ht 6' (1.829 m)   Wt 215 lb (97.5 kg)   BMI 29.16 kg/m  Last Weight:  Wt Readings from Last 1 Encounters:  07/06/16 215 lb (97.5 kg)   [id number] mass index is 29.16 kg/m.     Last Height:   Ht Readings from Last 1 Encounters:  07/06/16 6' (1.829 m)    Physical exam:  General: The patient is awake, alert and appears not in acute distress. The patient is well groomed. Head: Normocephalic, atraumatic. Neck is supple. Mallampati 4 ,  neck circumference:17.5 . Nasal airflow unrestricted , TMJ click is noted ,no Retrognathia is seen.  Cardiovascular:  Regular rate and rhythm, without  murmurs or carotid bruit, and without distended neck veins. Respiratory: Lungs are clear to auscultation. Skin:  Without evidence of edema,  or rash    Neurologic exam : The patient is awake and alert, oriented to place and time.   Memory subjective  described as intact.  Memory testing deferred.  MOCA: Montreal Cognitive Assessment  04/27/2016  Visuospatial/ Executive (0/5) 4  Naming (0/3) 3   Attention: Read list of digits (0/2) 1  Attention: Read list of letters (0/1) 1  Attention: Serial 7 subtraction starting at 100 (0/3) 1  Language: Repeat phrase (0/2) 2  Language : Fluency (0/1) 0  Abstraction (0/2) 2  Delayed Recall (0/5) 4  Orientation (0/6) 5  Total 23   MMSE - Mini Mental State Exam 07/06/2016  Orientation to time 4  Orientation to Place 4  Registration 3  Attention/ Calculation 3  Recall 2  Language- name 2 objects 2  Language- repeat 1  Language- follow 3 step command 3  Language- read & follow direction 1  Write a sentence 1  Copy design 0  Total score 24      Attention span & concentration ability appears impaired, voice is hoarse, speech poorly modulated.  Speech is fluent, with  Dysphonia. Mood and affect are appropriate.  Cranial nerves: Pupils are equal and briskly reactive to light. Funduscopic exam without evidence of pallor or edema. Extraocular movements  in vertical and horizontal planes are smoth and  intact and without nystagmus.  Visual fields by finger perimetry are intact. Hearing to finger rub intact.- with hearing aids. Facial sensation intact to fine touch. Facial motor strength is symmetric and tongue and uvula move midline. Shoulder shrug was symmetrical.   Assessment:  After physical and neurologic examination, review of laboratory studies,  Personal review of imaging studies, reports of other /same  Imaging studies ,  Results of polysomnography/ neurophysiology testing and pre-existing records as far as provided in visit., my assessment is   1) OSA - not longer on BiPAP due to non compliance. DME advanced home care. Will change to FFM on BiPAP 17 over 13 cm water. Had a good AHI.  I believe the compliance will improve once the patient does not have to suffer as many air leaks. He couldn't do it for 4 hours and the DME recalled the machine, he is now on CPAP> . The patient endorsed the Epworth sleepiness score at 15 points, which  is a very high sleepiness score the fatigue severity at 56 points, the geriatric depression score at 5 out of 15 points. I have no access to his compliance data today- will defer to next visit.   2) patient participated in a memory test and scored 24 out of 30 on the Mini-Mental Status Examination. He could not name the clinic but was well aware that he was in a doctor's office. He wasn't sure about the exact date, and had problems with his serial 7. He was able to recall 2 out of 3 words, and was able to perform a clock draw, animal fluency test was 9 points. Motor sentence and copy to an image perfectly.  Plan:  Treatment plan and additional workup :  Rv in 6 month with NP Ward Givens.CPAP data and MMSE !     Asencion Partridge Treyson Axel MD  07/06/2016   CC: Crist Infante, [address] [place], [place] [address]

## 2016-08-17 ENCOUNTER — Ambulatory Visit (INDEPENDENT_AMBULATORY_CARE_PROVIDER_SITE_OTHER): Payer: Medicare Other

## 2016-08-17 ENCOUNTER — Encounter (INDEPENDENT_AMBULATORY_CARE_PROVIDER_SITE_OTHER): Payer: Self-pay | Admitting: Physician Assistant

## 2016-08-17 ENCOUNTER — Ambulatory Visit (INDEPENDENT_AMBULATORY_CARE_PROVIDER_SITE_OTHER): Payer: Medicare Other | Admitting: Physician Assistant

## 2016-08-17 DIAGNOSIS — Z96651 Presence of right artificial knee joint: Secondary | ICD-10-CM

## 2016-08-17 NOTE — Progress Notes (Signed)
Office Visit Note   Patient: Lance [name]           Date of Birth: 10-19-1931           MRN: [id number] Visit Date: 08/17/2016              Requested by: Lance Infante, MD [address] [place], [place] [address] PCP: Lance Ly, MD   Assessment & Plan: Visit Diagnoses:  1. Presence of right artificial knee joint     Plan:Follow-up in May 2018 for AP and lateral views of the right knee. Lance [name] to work on range of motion strengthening knee. Patient did fall in the x-ray room today he denies any injury. He feels that he just became weak due to dehydration. His wife is contacted Dr. Joylene [name] plans to follow up there. Had no loss of consciousness chest pain or shortness of breath while at our office today.   Follow-Up Instructions: Return in about 6 months (around 02/14/2017).   Orders:  Orders Placed This Encounter  Procedures  . XR Knee 1-2 Views Left   No orders of the defined types were placed in this encounter.     Procedures: No procedures performed   Clinical Data: No additional findings.   Subjective: Chief Complaint  Patient presents with  . Left Knee - Routine Post Op    HPI Mr. Lance [name] continues work on range of motion strengthening of the left uni-knee. States he was doing well until he developed diverticulitis and this is slowed his therapy. Left knee is doing very well otherwise and he is happy with the result. Review of Systems   Objective: Vital Signs: There were no vitals taken for this visit.  Physical Exam  Ortho Exam  Specialty Comments:  No specialty comments available.  Imaging: Xr Knee 1-2 Views Left  Result Date: 08/17/2016 AP and lateral view left knee: No acute fracture. Overall good position alignment status post left uni-knee arthroplasty.    PMFS History: Patient Active Problem List   Diagnosis Date Noted  . 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 Osteoarthritis of left knee  02/25/2016  . Status post left partial knee replacement 02/25/2016  . Spinal stenosis at L4-L5 level 11/11/2015  . Aspiration pneumonia of left lower lobe due to gastric secretions ([facility]) 10/22/2015  . Bilateral leg weakness 10/22/2015  . 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  . OSA treated with BiPAP 10/22/2015  . Ambulatory dysfunction 10/22/2015  . CVA (cerebral infarction) 06/03/2013  . Syncope and collapse 06/03/2013  . Facial laceration 06/03/2013  . Acute bronchitis 06/03/2013  . Hypertension    Past Medical History:  Diagnosis Date  . Alcohol abuse    Remote hx, stopped in 1970  . Anxiety   . BPH (benign prostatic hypertrophy) with urinary obstruction   . Colon polyps   . Depression   . Diverticulosis   . Glaucoma   . Headache    sinus headaches  . Hypercholesterolemia   . Hypertension   . Hypogonadism male   . OSA on CPAP   . Osteoarthritis   . Osteopenia   . Sleep apnea   . Vitamin D deficiency     No family history on file.  Past Surgical History:  Procedure Laterality Date  . APPENDECTOMY  1945  . BACK SURGERY    . EYE SURGERY     bilateral cataracts  . LUMBAR LAMINECTOMY/DECOMPRESSION MICRODISCECTOMY Bilateral 11/11/2015   Procedure: Bilateral L4-5 Laminectomy;  Surgeon: Kary Kos, MD;  Location: [facility];  Service: Neurosurgery;  Laterality: Bilateral;  Bilateral L4-5 Laminectomy  . PARTIAL KNEE ARTHROPLASTY Left 02/25/2016   Procedure: LEFT UNICOMPARTMENTAL KNEE ARTHROPLASTY;  Surgeon: Mcarthur Rossetti, MD;  Location: [facility];  Service: Orthopedics;  Laterality: Left;  . REPLACEMENT UNICONDYLAR JOINT KNEE Left 02/25/2016  . SHOULDER SURGERY Right   . TONSILLECTOMY     Social History   Occupational History  . retired Retired   Social History Main Topics  . Smoking status: Former Smoker    Quit date: 09/28/1962  . Smokeless tobacco: Never Used  . Alcohol use No  . Drug use: No  . Sexual activity: Not on file

## 2016-10-28 ENCOUNTER — Observation Stay (HOSPITAL_COMMUNITY)
Admission: EM | Admit: 2016-10-28 | Discharge: 2016-10-31 | Disposition: A | Discharge status: Home / Self Care | Payer: Medicare Other | Attending: Internal Medicine | Admitting: Internal Medicine

## 2016-10-28 ENCOUNTER — Emergency Department (HOSPITAL_COMMUNITY): Payer: Medicare Other

## 2016-10-28 ENCOUNTER — Encounter (HOSPITAL_COMMUNITY): Payer: Self-pay

## 2016-10-28 ENCOUNTER — Ambulatory Visit: Payer: Medicare Other | Admitting: Adult Health

## 2016-10-28 DIAGNOSIS — Z888 Allergy status to other drugs, medicaments and biological substances status: Secondary | ICD-10-CM | POA: Insufficient documentation

## 2016-10-28 DIAGNOSIS — G9389 Other specified disorders of brain: Secondary | ICD-10-CM | POA: Diagnosis not present

## 2016-10-28 DIAGNOSIS — R0902 Hypoxemia: Secondary | ICD-10-CM | POA: Insufficient documentation

## 2016-10-28 DIAGNOSIS — M8580 Other specified disorders of bone density and structure, unspecified site: Secondary | ICD-10-CM | POA: Diagnosis not present

## 2016-10-28 DIAGNOSIS — M1990 Unspecified osteoarthritis, unspecified site: Secondary | ICD-10-CM | POA: Insufficient documentation

## 2016-10-28 DIAGNOSIS — M47812 Spondylosis without myelopathy or radiculopathy, cervical region: Secondary | ICD-10-CM | POA: Diagnosis not present

## 2016-10-28 DIAGNOSIS — M6281 Muscle weakness (generalized): Secondary | ICD-10-CM

## 2016-10-28 DIAGNOSIS — R42 Dizziness and giddiness: Secondary | ICD-10-CM | POA: Diagnosis not present

## 2016-10-28 DIAGNOSIS — N401 Enlarged prostate with lower urinary tract symptoms: Secondary | ICD-10-CM | POA: Insufficient documentation

## 2016-10-28 DIAGNOSIS — H409 Unspecified glaucoma: Secondary | ICD-10-CM | POA: Insufficient documentation

## 2016-10-28 DIAGNOSIS — F329 Major depressive disorder, single episode, unspecified: Secondary | ICD-10-CM | POA: Diagnosis not present

## 2016-10-28 DIAGNOSIS — Z880 Allergy status to penicillin: Secondary | ICD-10-CM | POA: Diagnosis not present

## 2016-10-28 DIAGNOSIS — Z7982 Long term (current) use of aspirin: Secondary | ICD-10-CM | POA: Diagnosis not present

## 2016-10-28 DIAGNOSIS — F419 Anxiety disorder, unspecified: Secondary | ICD-10-CM | POA: Insufficient documentation

## 2016-10-28 DIAGNOSIS — I700 Atherosclerosis of aorta: Secondary | ICD-10-CM | POA: Insufficient documentation

## 2016-10-28 DIAGNOSIS — G4733 Obstructive sleep apnea (adult) (pediatric): Secondary | ICD-10-CM | POA: Diagnosis not present

## 2016-10-28 DIAGNOSIS — Z87891 Personal history of nicotine dependence: Secondary | ICD-10-CM | POA: Insufficient documentation

## 2016-10-28 DIAGNOSIS — Z8673 Personal history of transient ischemic attack (TIA), and cerebral infarction without residual deficits: Secondary | ICD-10-CM | POA: Diagnosis not present

## 2016-10-28 DIAGNOSIS — E7800 Pure hypercholesterolemia, unspecified: Secondary | ICD-10-CM | POA: Diagnosis not present

## 2016-10-28 DIAGNOSIS — R55 Syncope and collapse: Secondary | ICD-10-CM | POA: Diagnosis present

## 2016-10-28 DIAGNOSIS — I10 Essential (primary) hypertension: Secondary | ICD-10-CM | POA: Diagnosis present

## 2016-10-28 DIAGNOSIS — W1800XA Striking against unspecified object with subsequent fall, initial encounter: Secondary | ICD-10-CM | POA: Diagnosis not present

## 2016-10-28 DIAGNOSIS — N138 Other obstructive and reflux uropathy: Secondary | ICD-10-CM | POA: Diagnosis not present

## 2016-10-28 DIAGNOSIS — S0219XA Other fracture of base of skull, initial encounter for closed fracture: Secondary | ICD-10-CM | POA: Diagnosis not present

## 2016-10-28 DIAGNOSIS — E559 Vitamin D deficiency, unspecified: Secondary | ICD-10-CM | POA: Insufficient documentation

## 2016-10-28 DIAGNOSIS — I672 Cerebral atherosclerosis: Secondary | ICD-10-CM | POA: Diagnosis not present

## 2016-10-28 LAB — BASIC METABOLIC PANEL
Anion gap: 9 (ref 5–15)
BUN: 27 mg/dL — ABNORMAL HIGH (ref 6–20)
CALCIUM: 8.6 mg/dL — AB (ref 8.9–10.3)
CHLORIDE: 102 mmol/L (ref 101–111)
CO2: 26 mmol/L (ref 22–32)
CREATININE: 1.3 mg/dL — AB (ref 0.61–1.24)
GFR calc Af Amer: 57 mL/min — ABNORMAL LOW (ref >60)
GFR calc non Af Amer: 49 mL/min — ABNORMAL LOW (ref >60)
GLUCOSE: 106 mg/dL — AB (ref 65–99)
Potassium: 3.8 mmol/L (ref 3.5–5.1)
Sodium: 137 mmol/L (ref 135–145)

## 2016-10-28 LAB — CBC
HCT: 40.5 % (ref 39.0–52.0)
HEMOGLOBIN: 13.7 g/dL (ref 13.0–17.0)
MCH: 30.9 pg (ref 26.0–34.0)
MCHC: 33.8 g/dL (ref 30.0–36.0)
MCV: 91.4 fL (ref 78.0–100.0)
Platelets: 224 10*3/uL (ref 150–400)
RBC: 4.43 MIL/uL (ref 4.22–5.81)
RDW: 13.5 % (ref 11.5–15.5)
WBC: 12.8 10*3/uL — ABNORMAL HIGH (ref 4.0–10.5)

## 2016-10-28 LAB — I-STAT TROPONIN, ED: Troponin i, poc: 0 ng/mL (ref 0.00–0.08)

## 2016-10-28 LAB — CBG MONITORING, ED: GLUCOSE-CAPILLARY: 110 mg/dL — AB (ref 65–99)

## 2016-10-28 MED ORDER — SODIUM CHLORIDE 0.9 % IV BOLUS (SEPSIS)
1000.0000 mL | Freq: Once | INTRAVENOUS | Status: AC
  Administered 2016-10-28: 1000 mL via INTRAVENOUS

## 2016-10-28 MED ORDER — IOPAMIDOL (ISOVUE-370) INJECTION 76%
INTRAVENOUS | Status: AC
  Filled 2016-10-28: qty 100

## 2016-10-28 MED ORDER — IOPAMIDOL (ISOVUE-370) INJECTION 76%
100.0000 mL | Freq: Once | INTRAVENOUS | Status: AC | PRN
  Administered 2016-10-28: 100 mL via INTRAVENOUS

## 2016-10-28 MED ORDER — SODIUM CHLORIDE 0.9 % IJ SOLN
INTRAMUSCULAR | Status: AC
  Filled 2016-10-28: qty 50

## 2016-10-28 NOTE — ED Notes (Signed)
Just returned from x-ray and CT

## 2016-10-28 NOTE — ED Notes (Signed)
ED Provider at bedside. 

## 2016-10-28 NOTE — ED Notes (Signed)
Patient transported to X-ray 

## 2016-10-28 NOTE — ED Provider Notes (Signed)
[place] DEPT Provider Note   CSN: [id number] Arrival date & time: 10/28/16  1652     History   Chief Complaint Chief Complaint  Patient presents with  . Fall  . Dizziness  . Head Laceration  . Loss of Consciousness    HPI Brennan Frattini is a 83 y.o. male.  HPI  83 year old male who presents with near syncope and fall. History of HTN and no anticoagulation. States he has been having episodic dizziness over past few days. His wife thought it may be related to blood pressure medications and called the PCP office yesterday. It was recommended that he drink more water for this. This morning he documented his BP to be 78 SBP. He has not had medication changes. Took his BP medications last night including lasix. Today, was seated and got up to walk when he felt dizzy and fell backwards on his head. No reported LOC or syncope, but later states he thinks he may have passed out. No N/V/D, back pain, abdominal pain, fever, cough, dyspnea, or chest pain. No melena or hematochezia. No LE edema or leg pain.   Past Medical History:  Diagnosis Date  . Alcohol abuse    Remote hx, stopped in 1970  . Anxiety   . BPH (benign prostatic hypertrophy) with urinary obstruction   . Colon polyps   . Depression   . Diverticulosis   . Glaucoma   . Headache    sinus headaches  . Hypercholesterolemia   . Hypertension   . Hypogonadism male   . OSA on CPAP   . Osteoarthritis   . Osteopenia   . Sleep apnea   . Vitamin D deficiency     Patient Active Problem List   Diagnosis Date Noted  . Dizziness 10/28/2016  . 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 Osteoarthritis of left knee 02/25/2016  . Status post left partial knee replacement 02/25/2016  . Spinal stenosis at L4-L5 level 11/11/2015  . Aspiration pneumonia of left lower lobe due to gastric secretions ([place]) 10/22/2015  . Bilateral leg weakness 10/22/2015  . 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  . OSA  treated with BiPAP 10/22/2015  . Ambulatory dysfunction 10/22/2015  . CVA (cerebral infarction) 06/03/2013  . Syncope and collapse 06/03/2013  . Facial laceration 06/03/2013  . Acute bronchitis 06/03/2013  . Hypertension     Past Surgical History:  Procedure Laterality Date  . APPENDECTOMY  1945  . BACK SURGERY    . EYE SURGERY     bilateral cataracts  . LUMBAR LAMINECTOMY/DECOMPRESSION MICRODISCECTOMY Bilateral 11/11/2015   Procedure: Bilateral L4-5 Laminectomy;  Surgeon: Kary Kos, MD;  Location: [facility];  Service: Neurosurgery;  Laterality: Bilateral;  Bilateral L4-5 Laminectomy  . PARTIAL KNEE ARTHROPLASTY Left 02/25/2016   Procedure: LEFT UNICOMPARTMENTAL KNEE ARTHROPLASTY;  Surgeon: Mcarthur Rossetti, MD;  Location: [facility];  Service: Orthopedics;  Laterality: Left;  . REPLACEMENT UNICONDYLAR JOINT KNEE Left 02/25/2016  . SHOULDER SURGERY Right   . TONSILLECTOMY         Home Medications    Prior to Admission medications   Medication Sig Start Date End Date Taking? Authorizing Provider  amLODipine (NORVASC) 5 MG tablet Take 5 mg by mouth every evening.  09/17/15  Yes Historical Provider, MD  aspirin 325 MG tablet Take 650 mg by mouth every evening.    Yes Historical Provider, MD  buPROPion (WELLBUTRIN SR) 150 MG 12 hr tablet Take 150 mg by mouth 2 (two) times  daily.   Yes Historical Provider, MD  diazepam (VALIUM) 10 MG tablet Take 10 mg by mouth every 8 (eight) hours as needed for anxiety.    Yes Historical Provider, MD  doxepin (SINEQUAN) 75 MG capsule TAKE ONE CAPSULE BY MOUTH AT BEDTIME 01/22/16  Yes Larey Seat, MD  ergocalciferol (VITAMIN D2) 50000 UNITS capsule Take 50,000 Units by mouth every Monday.    Yes Historical Provider, MD  ezetimibe (ZETIA) 10 MG tablet Take 10 mg by mouth daily. 09/29/16  Yes Historical Provider, MD  finasteride (PROSCAR) 5 MG tablet Take 5 mg by mouth every morning.    Yes Historical Provider, MD  mirabegron ER (MYRBETRIQ) 25 MG  TB24 tablet Take 25 mg by mouth daily.   Yes Historical Provider, MD  Multiple Vitamin (MULTIVITAMIN) capsule Take 1 capsule by mouth daily.    Yes Historical Provider, MD  OMEGA 3 1000 MG CAPS Take 1,000 mg by mouth 3 (three) times daily.    Yes Historical Provider, MD  OVER THE COUNTER MEDICATION Take 1 tablet by mouth daily. Niacin Flush Free 300-500mg    Yes Historical Provider, MD  Tamsulosin HCl (FLOMAX) 0.4 MG CAPS Take 0.8 mg by mouth daily after supper.    Yes Historical Provider, MD  Timolol Maleate PF 0.5 % SOLN Place 1 drop into both eyes every morning.   Yes Historical Provider, MD  traMADol (ULTRAM) 50 MG tablet Take 50-100 mg by mouth every 6 (six) hours as needed for severe pain.    Yes Historical Provider, MD  valsartan (DIOVAN) 320 MG tablet Take 320 mg by mouth every evening.  10/15/15  Yes Historical Provider, MD  vitamin C (ASCORBIC ACID) 500 MG tablet Take 500 mg by mouth daily.   Yes Historical Provider, MD  acetaminophen (TYLENOL) 500 MG tablet Take 500 mg by mouth every 6 (six) hours as needed (For pain.).    Historical Provider, MD    Family History No family history on file.  Social History Social History  Substance Use Topics  . Smoking status: Former Smoker    Quit date: 09/28/1962  . Smokeless tobacco: Never Used  . Alcohol use No     Allergies   Penicillins and Simvastatin   Review of Systems Review of Systems 10/14 systems reviewed and are negative other than those stated in the HPI   Physical Exam Updated Vital Signs BP 150/87   Pulse 86   Temp 98.2 F (36.8 C) (Oral)   Resp 17   Ht 6' (1.829 m)   Wt 204 lb (92.5 kg)   SpO2 94%   BMI 27.67 kg/m   Physical Exam Physical Exam  Nursing note and vitals reviewed. Constitutional:  non-toxic, and in no acute distress Head: Normocephalic and laceration over posterior scalp Mouth/Throat: Oropharynx is clear and moist.  Neck: Normal range of motion. Neck supple. no cervical spine  tenderness Cardiovascular: Normal rate and regular rhythm.   Pulmonary/Chest: Effort normal and breath sounds normal. no chest wall tenderness. Abdominal: Soft. There is no tenderness. There is no rebound and no guarding.  Musculoskeletal: Normal range of motion. no deformities.  Neurological: Alert, no facial droop, fluent speech, moves all extremities symmetrically, sensation to light touch in tact throughout, no dysmetria with finger to nose. Skin: Skin is warm and dry.  Psychiatric: Cooperative   ED Treatments / Results  Labs (all labs ordered are listed, but only abnormal results are displayed) Labs Reviewed  BASIC METABOLIC PANEL - Abnormal; Notable for the following:  Result Value   Glucose, Bld 106 (*)    BUN 27 (*)    Creatinine, Ser 1.30 (*)    Calcium 8.6 (*)    GFR calc non Af Amer 49 (*)    GFR calc Af Amer 57 (*)    All other components within normal limits  CBC - Abnormal; Notable for the following:    WBC 12.8 (*)    All other components within normal limits  CBG MONITORING, ED - Abnormal; Notable for the following:    Glucose-Capillary 110 (*)    All other components within normal limits  URINALYSIS, ROUTINE W REFLEX MICROSCOPIC  DIFFERENTIAL  I-STAT TROPOININ, ED    EKG  EKG Interpretation  Date/Time:  Wednesday October 28 2016 17:29:35 EST Ventricular Rate:  70 PR Interval:    QRS Duration: 102 QT Interval:  419 QTC Calculation: 453 R Axis:   -42 Text Interpretation:  Sinus rhythm Prolonged PR interval Inferior infarct, old Probable anteroseptal infarct, old Baseline wander in lead(s) V4 V5 V6 wandering baseline, technically difficult First degree A-V block otehrwise similar to prior EKG  Confirmed by Burnis Kaser MD, Luria Rosario [phone]) on 10/28/2016 5:41:28 PM       Radiology Dg Chest 2 View  Result Date: 10/28/2016 CLINICAL DATA:  Dizziness and weakness. Fell backwards. Chest pain. EXAM: CHEST  2 VIEW COMPARISON:  06/03/2013 FINDINGS: Heart size is within  normal limits. Aortic atherosclerosis. Both lungs are clear. No evidence of pneumothorax or pleural effusion. IMPRESSION: No active cardiopulmonary disease.  Aortic atherosclerosis. Electronically Signed   By: Earle Gell M.D.   On: 10/28/2016 18:33   Ct Head Wo Contrast  Result Date: 10/28/2016 CLINICAL DATA:  83 year old male with near syncope and fall. EXAM: CT HEAD WITHOUT CONTRAST CT CERVICAL SPINE WITHOUT CONTRAST TECHNIQUE: Multidetector CT imaging of the head and cervical spine was performed following the standard protocol without intravenous contrast. Multiplanar CT image reconstructions of the cervical spine were also generated. COMPARISON:  None. FINDINGS: CT HEAD FINDINGS Brain: No evidence of acute infarction, hemorrhage, hydrocephalus, extra-axial collection or mass lesion/mass effect. Mild atrophy and chronic small-vessel white matter ischemic changes are noted. Vascular: Intracranial atherosclerotic calcifications noted. Skull: No acute fracture or focal lesion. Sinuses/Orbits: A nondisplaced transverse fracture of the right mastoid is noted (best seen on the coronal cervical spine images) with a right mastoid effusion. Tiny amount of fluid in the left mastoids are noted. The paranasal sinuses are clear. Other: None CT CERVICAL SPINE FINDINGS Alignment: Normal. Skull base and vertebrae: No acute cervical spine fracture. No primary bone lesion or focal pathologic process. Soft tissues and spinal canal: No prevertebral fluid or swelling. No visible canal hematoma. Disc levels: Multilevel degenerative disc disease, spondylosis and facet arthropathy noted, mild to moderate at C3-4 and C4-5. Upper chest: Negative. Other: A nondisplaced transverse fracture of the right mastoid is noted (best seen on the coronal cervical spine images) with a right mastoid effusion. IMPRESSION: Nondisplaced transverse right mastoid fracture with right mastoid effusion (best seen on the coronal cervical spine images).  Consider temporal bone CT for further characterization as clinically indicated. No evidence of acute intracranial abnormality. Chronic small-vessel white matter ischemic changes. No static evidence of acute injury to the cervical spine. Degenerative changes as described. Electronically Signed   By: Margarette Canada M.D.   On: 10/28/2016 19:10   Ct Angio Chest Pe W And/or Wo Contrast  Result Date: 10/28/2016 CLINICAL DATA:  Near syncope. Episodic lightheadedness over the past several days. [place]  today. EXAM: CT ANGIOGRAPHY CHEST WITH CONTRAST TECHNIQUE: Multidetector CT imaging of the chest was performed using the standard protocol during bolus administration of intravenous contrast. Multiplanar CT image reconstructions and MIPs were obtained to evaluate the vascular anatomy. CONTRAST:  100 mL Isovue 370 intravenous COMPARISON:  10/11/2015 FINDINGS: Cardiovascular: Satisfactory opacification of the pulmonary arteries to the segmental level. No evidence of pulmonary embolism. Normal heart size. No pericardial effusion. Thoracic aorta is normal in caliber with moderate atherosclerotic calcification. Extensive calcified coronary artery plaque. Mediastinum/Nodes: No enlarged mediastinal, hilar, or axillary lymph nodes. Thyroid gland, trachea, and esophagus demonstrate no significant findings. Lungs/Pleura: Linear scarring or atelectasis in the bases, similar to the 10/11/2015 examination. No pleural effusions. Airways are patent. Upper Abdomen: No acute abnormality. Musculoskeletal: No significant skeletal lesion. Review of the MIP images confirms the above findings. IMPRESSION: Negative for acute pulmonary embolism. Moderate linear scarring or atelectasis in both lung bases. Electronically Signed   By: Andreas Newport M.D.   On: 10/28/2016 22:55   Ct Cervical Spine Wo Contrast  Result Date: 10/28/2016 CLINICAL DATA:  83 year old male with near syncope and fall. EXAM: CT HEAD WITHOUT CONTRAST CT CERVICAL SPINE  WITHOUT CONTRAST TECHNIQUE: Multidetector CT imaging of the head and cervical spine was performed following the standard protocol without intravenous contrast. Multiplanar CT image reconstructions of the cervical spine were also generated. COMPARISON:  None. FINDINGS: CT HEAD FINDINGS Brain: No evidence of acute infarction, hemorrhage, hydrocephalus, extra-axial collection or mass lesion/mass effect. Mild atrophy and chronic small-vessel white matter ischemic changes are noted. Vascular: Intracranial atherosclerotic calcifications noted. Skull: No acute fracture or focal lesion. Sinuses/Orbits: A nondisplaced transverse fracture of the right mastoid is noted (best seen on the coronal cervical spine images) with a right mastoid effusion. Tiny amount of fluid in the left mastoids are noted. The paranasal sinuses are clear. Other: None CT CERVICAL SPINE FINDINGS Alignment: Normal. Skull base and vertebrae: No acute cervical spine fracture. No primary bone lesion or focal pathologic process. Soft tissues and spinal canal: No prevertebral fluid or swelling. No visible canal hematoma. Disc levels: Multilevel degenerative disc disease, spondylosis and facet arthropathy noted, mild to moderate at C3-4 and C4-5. Upper chest: Negative. Other: A nondisplaced transverse fracture of the right mastoid is noted (best seen on the coronal cervical spine images) with a right mastoid effusion. IMPRESSION: Nondisplaced transverse right mastoid fracture with right mastoid effusion (best seen on the coronal cervical spine images). Consider temporal bone CT for further characterization as clinically indicated. No evidence of acute intracranial abnormality. Chronic small-vessel white matter ischemic changes. No static evidence of acute injury to the cervical spine. Degenerative changes as described. Electronically Signed   By: Margarette Canada M.D.   On: 10/28/2016 19:10    Procedures Procedures (including critical care time)  Medications  Ordered in ED Medications  iopamidol (ISOVUE-370) 76 % injection (not administered)  sodium chloride 0.9 % injection (not administered)  sodium chloride 0.9 % bolus 1,000 mL (1,000 mLs Intravenous New Bag/Given 10/28/16 1815)  iopamidol (ISOVUE-370) 76 % injection 100 mL (100 mLs Intravenous Contrast Given 10/28/16 2226)     Initial Impression / Assessment and Plan / ED Course  I have reviewed the triage vital signs and the nursing notes.  Pertinent labs & imaging results that were available during my care of the patient were reviewed by me and considered in my medical decision making (see chart for details).     83 year old male who presents with fall in the setting of  dizziness. He is nontoxic in no acute distress on presentation. Blood pressures initially soft in the low 100s on arrival, but while lying down is asymptomatic. His low blood pressures at home this morning suspect that this her orthostasis may be etiology of his symptoms. His EKG does show first-degree AV block, but otherwise no acute ischemic changes, heart strain, or other stigmata of arrhythmia. Blood work with mild history of present illness with a creatinine of 1.3 and elevated BUN. Stable hgb, and no symptoms of GI bleed.   He does incidentally have 2 L oxygen apartment here in the emergency department. No shortness of breath or chest pain. Initial chest x-ray visualized and shows no acute cardio pulmonary processes. Did underwent CT angiogram of the chest rule out PE given potential syncopal episode. This is negative and shows no acute pulmonary processes.  Patient did receive 1 L of IV fluids. Upon getting him up to do repeat orthostatics, with HR 80s to 110s with lying to standing. Still dizzy with standing and states room seems to be moving. He is unable to ambulate due to gait instability.   On chart review, patient admitted back in 2014 for syncope versus near syncope and dizziness. MRI at that time that was suggestive  of a pontine stroke. Question if this potentially similar. MRI ordered.  Discussed with Dr. Hal Hope, who requested neuro consult. Dr. Cheral Marker felt patient stable to stay at [facility] for MRI and admission at [facility]. Patient admitted to hospitalist service for MRI and syncope vs vertigo w/u.   Final Clinical Impressions(s) / ED Diagnoses   Final diagnoses:  Dizziness  Near syncope    New Prescriptions New Prescriptions   No medications on file     Forde Dandy, MD 10/28/16 2358

## 2016-10-28 NOTE — ED Notes (Signed)
BEDSIDE REPORT

## 2016-10-28 NOTE — ED Triage Notes (Signed)
Per [facility]- Pt on lasix. Pt HX of dizziness. Pt at store felt dizziness and fell backwards striking back of head. NO LOC. [facility] cleared. Denies neck and back pain . NEG for stroke. Neuro intact. Pt states increased dizziness with sitting up. Denies N/V/D or fever Laceration to posterior head-bleeding controlled

## 2016-10-28 NOTE — ED Notes (Signed)
Bed: [id number] Expected date:  Expected time:  Means of arrival:  Comments: 82 yo m fall head lac

## 2016-10-29 ENCOUNTER — Observation Stay (HOSPITAL_COMMUNITY): Payer: Medicare Other

## 2016-10-29 ENCOUNTER — Observation Stay (HOSPITAL_BASED_OUTPATIENT_CLINIC_OR_DEPARTMENT_OTHER): Payer: Medicare Other

## 2016-10-29 ENCOUNTER — Encounter (HOSPITAL_COMMUNITY): Payer: Self-pay | Admitting: Internal Medicine

## 2016-10-29 DIAGNOSIS — G4733 Obstructive sleep apnea (adult) (pediatric): Secondary | ICD-10-CM | POA: Diagnosis not present

## 2016-10-29 DIAGNOSIS — R55 Syncope and collapse: Secondary | ICD-10-CM | POA: Diagnosis not present

## 2016-10-29 DIAGNOSIS — I10 Essential (primary) hypertension: Secondary | ICD-10-CM | POA: Diagnosis not present

## 2016-10-29 LAB — CBC
HCT: 39.8 % (ref 39.0–52.0)
HEMOGLOBIN: 13.9 g/dL (ref 13.0–17.0)
MCH: 31.4 pg (ref 26.0–34.0)
MCHC: 34.9 g/dL (ref 30.0–36.0)
MCV: 89.8 fL (ref 78.0–100.0)
Platelets: 215 10*3/uL (ref 150–400)
RBC: 4.43 MIL/uL (ref 4.22–5.81)
RDW: 13.2 % (ref 11.5–15.5)
WBC: 11 10*3/uL — ABNORMAL HIGH (ref 4.0–10.5)

## 2016-10-29 LAB — COMPREHENSIVE METABOLIC PANEL
ALT: 12 U/L — ABNORMAL LOW (ref 17–63)
AST: 16 U/L (ref 15–41)
Albumin: 3.4 g/dL — ABNORMAL LOW (ref 3.5–5.0)
Alkaline Phosphatase: 55 U/L (ref 38–126)
Anion gap: 7 (ref 5–15)
BUN: 18 mg/dL (ref 6–20)
CO2: 28 mmol/L (ref 22–32)
Calcium: 8.6 mg/dL — ABNORMAL LOW (ref 8.9–10.3)
Chloride: 106 mmol/L (ref 101–111)
Creatinine, Ser: 0.86 mg/dL (ref 0.61–1.24)
GFR calc Af Amer: >60 mL/min (ref >60)
GFR calc non Af Amer: >60 mL/min (ref >60)
Glucose, Bld: 134 mg/dL — ABNORMAL HIGH (ref 65–99)
Potassium: 3.8 mmol/L (ref 3.5–5.1)
Sodium: 141 mmol/L (ref 135–145)
Total Bilirubin: 0.6 mg/dL (ref 0.3–1.2)
Total Protein: 6.5 g/dL (ref 6.5–8.1)

## 2016-10-29 LAB — URINALYSIS, ROUTINE W REFLEX MICROSCOPIC
Bilirubin Urine: NEGATIVE
GLUCOSE, UA: NEGATIVE mg/dL
Hgb urine dipstick: NEGATIVE
Ketones, ur: NEGATIVE mg/dL
LEUKOCYTES UA: NEGATIVE
Nitrite: NEGATIVE
PROTEIN: NEGATIVE mg/dL
Specific Gravity, Urine: 1.019 (ref 1.005–1.030)
pH: 6 (ref 5.0–8.0)

## 2016-10-29 LAB — ECHOCARDIOGRAM COMPLETE
HEIGHTINCHES: 72 in
WEIGHTICAEL: 3264 [oz_av]

## 2016-10-29 LAB — CORTISOL: Cortisol, Plasma: 10.4 ug/dL

## 2016-10-29 LAB — TROPONIN I

## 2016-10-29 MED ORDER — ACETAMINOPHEN 160 MG/5ML PO SOLN: 650.0000 mg | ORAL | Status: DC | PRN

## 2016-10-29 MED ORDER — PERFLUTREN LIPID MICROSPHERE
INTRAVENOUS | Status: AC
  Filled 2016-10-29: qty 10

## 2016-10-29 MED ORDER — FINASTERIDE 5 MG PO TABS
5.0000 mg | ORAL_TABLET | Freq: Every day | ORAL | Status: DC
  Administered 2016-10-29: 5 mg via ORAL
  Filled 2016-10-29: qty 1

## 2016-10-29 MED ORDER — MIRABEGRON ER 25 MG PO TB24
25.0000 mg | ORAL_TABLET | Freq: Every day | ORAL | Status: DC
  Administered 2016-10-29 – 2016-10-31 (×3): 25 mg via ORAL
  Filled 2016-10-29 (×3): qty 1

## 2016-10-29 MED ORDER — DOXEPIN HCL 75 MG PO CAPS
75.0000 mg | ORAL_CAPSULE | Freq: Every day | ORAL | Status: DC
  Administered 2016-10-29: 75 mg via ORAL
  Filled 2016-10-29: qty 1

## 2016-10-29 MED ORDER — TRAMADOL HCL 50 MG PO TABS
50.0000 mg | ORAL_TABLET | Freq: Once | ORAL | Status: AC | PRN
  Administered 2016-10-29: 50 mg via ORAL
  Filled 2016-10-29: qty 1

## 2016-10-29 MED ORDER — PERFLUTREN LIPID MICROSPHERE
1.0000 mL | INTRAVENOUS | Status: AC | PRN
  Administered 2016-10-29: 5 mL via INTRAVENOUS
  Filled 2016-10-29: qty 10

## 2016-10-29 MED ORDER — OMEGA-3-ACID ETHYL ESTERS 1 G PO CAPS
1000.0000 mg | ORAL_CAPSULE | Freq: Three times a day (TID) | ORAL | Status: DC
  Administered 2016-10-29 – 2016-10-31 (×6): 1000 mg via ORAL
  Filled 2016-10-29 (×8): qty 1

## 2016-10-29 MED ORDER — CHLORHEXIDINE GLUCONATE 0.12 % MT SOLN
15.0000 mL | Freq: Two times a day (BID) | OROMUCOSAL | Status: DC
  Administered 2016-10-30 – 2016-10-31 (×3): 15 mL via OROMUCOSAL
  Filled 2016-10-29 (×3): qty 15

## 2016-10-29 MED ORDER — ACETAMINOPHEN 650 MG RE SUPP: 650.0000 mg | RECTAL | Status: DC | PRN

## 2016-10-29 MED ORDER — HYDRALAZINE HCL 20 MG/ML IJ SOLN
10.0000 mg | INTRAMUSCULAR | Status: DC | PRN
  Administered 2016-10-30: 10 mg via INTRAVENOUS
  Filled 2016-10-29: qty 1

## 2016-10-29 MED ORDER — ADULT MULTIVITAMIN W/MINERALS CH
1.0000 | ORAL_TABLET | Freq: Every day | ORAL | Status: DC
  Administered 2016-10-29 – 2016-10-31 (×3): 1 via ORAL
  Filled 2016-10-29 (×3): qty 1

## 2016-10-29 MED ORDER — EZETIMIBE 10 MG PO TABS
10.0000 mg | ORAL_TABLET | Freq: Every day | ORAL | Status: DC
  Administered 2016-10-29 – 2016-10-31 (×3): 10 mg via ORAL
  Filled 2016-10-29 (×3): qty 1

## 2016-10-29 MED ORDER — SODIUM CHLORIDE 0.9 % IV SOLN
INTRAVENOUS | Status: DC
  Administered 2016-10-29 – 2016-10-30 (×2): via INTRAVENOUS

## 2016-10-29 MED ORDER — ORAL CARE MOUTH RINSE
15.0000 mL | Freq: Two times a day (BID) | OROMUCOSAL | Status: DC
  Administered 2016-10-30 – 2016-10-31 (×2): 15 mL via OROMUCOSAL

## 2016-10-29 MED ORDER — BUPROPION HCL ER (SR) 150 MG PO TB12
150.0000 mg | ORAL_TABLET | Freq: Two times a day (BID) | ORAL | Status: DC
  Administered 2016-10-29 – 2016-10-31 (×5): 150 mg via ORAL
  Filled 2016-10-29 (×5): qty 1

## 2016-10-29 MED ORDER — TAMSULOSIN HCL 0.4 MG PO CAPS: 0.8000 mg | ORAL_CAPSULE | Freq: Every day | ORAL | Status: DC

## 2016-10-29 MED ORDER — VITAMIN C 500 MG PO TABS
500.0000 mg | ORAL_TABLET | Freq: Every day | ORAL | Status: DC
Start: 2016-10-29 — End: 2016-10-31
  Administered 2016-10-29 – 2016-10-31 (×3): 500 mg via ORAL
  Filled 2016-10-29 (×3): qty 1

## 2016-10-29 MED ORDER — DIAZEPAM 5 MG PO TABS
10.0000 mg | ORAL_TABLET | Freq: Three times a day (TID) | ORAL | Status: DC | PRN
  Administered 2016-10-29 – 2016-10-30 (×3): 10 mg via ORAL
  Filled 2016-10-29 (×3): qty 2

## 2016-10-29 MED ORDER — VITAMIN D (ERGOCALCIFEROL) 1.25 MG (50000 UNIT) PO CAPS: 50000.0000 [IU] | ORAL_CAPSULE | ORAL | Status: DC

## 2016-10-29 MED ORDER — ACETAMINOPHEN 325 MG PO TABS: 650.0000 mg | ORAL_TABLET | Freq: Four times a day (QID) | ORAL | Status: DC | PRN

## 2016-10-29 MED ORDER — ACETAMINOPHEN 325 MG PO TABS
650.0000 mg | ORAL_TABLET | ORAL | Status: DC | PRN
  Administered 2016-10-29 – 2016-10-31 (×6): 650 mg via ORAL
  Filled 2016-10-29 (×6): qty 2

## 2016-10-29 NOTE — Progress Notes (Signed)
RT arrived at Pt's room at time Pt requested to place Pt on BiPAP.  Pt was in chair at that time.  2 NTs helped Pt back into bed but Pt became nauseated at that time.  Pt will call when no longer nauseated and ready for BiPAP qhs. RT will continue to monitor as needed.

## 2016-10-29 NOTE — Progress Notes (Signed)
PT  Note  Patient Details Name: Lance [name] MRN: [id number] DOB: 1932-02-20  Received order for PT, spoke with nurse who stated pt with great dizziness and high risk for fall , and awaiting admit. Will await for pt to be placed on unit for increased safety to work with patient. If you need [place] to see patient prior to then, please don't hesitate to page me.    Clide Dales 10/29/2016, 10:04 AM  Clide Dales, PT Pager: [phone] 10/29/2016

## 2016-10-29 NOTE — Progress Notes (Signed)
Progress note  Patient admitted earlier this morning, See H&P. Had a syncopal episode yesterday. Now under observation for work up. He is on norvasc, proscar, flomax, valsartan, which can all contribute to orthostatic syncope. Will hold these meds. Neurosurgery has reviewed imaging for right temporal bone fx and recommended conservative care.   Echo pending PT/OT to eval Repeat orthostatic VS in AM  Dessa Phi, DO [facility] [email or web address] Password [email or web address] 10/29/2016, 2:16 PM

## 2016-10-29 NOTE — Progress Notes (Signed)
  Echocardiogram 2D Echocardiogram has been performed.  Lance [name] M 10/29/2016, 3:38 PM

## 2016-10-29 NOTE — Consult Note (Signed)
  Contacted about Lance [name] who had a fall striking his head. He has a CT scan that demonstrates that he has a temporal bone fracture on the right side. The fracture is linear nondisplaced there is some blood within the mastoid air cells. Fracture may extend to the external auditory canal. This fracture appears inconsequential and should heal itself. The patient should develop significant CSF otorrhea please contact us at that time.

## 2016-10-29 NOTE — H&P (Addendum)
History and Physical    Lance [name] [id number] DOB: 23-Jan-1932 DOA: 10/28/2016  PCP: Jerlyn Ly, MD  Patient coming from: Home.  Chief Complaint: Loss of consciousness.  HPI: Lance [name] is a 83 y.o. male with history of stroke, hypertension was brought to the ER after patient had a syncopal episode while shopping last afternoon. As per the patient and his wife patient has been feeling dizzy off and on for last 1 month. Patient gets dizzy particularly on standing. Yesterday patient had suddenly lost consciousness while shopping. The episode lasted for a few seconds. Patient did not have any incontinence of urine or tongue bite. Does not remember the incident. He did hit his head and had a wound. CT scan of the head did not show anything acute except for the right mastoid fracture. Patient had a similar episode in 2014 when patient had stroke. On-call neurologist Dr. Cheral Marker was consulted by the ER physician. Neurologist advised MRI brain. Patient on standing was found to get tachycardic heart rate increased from 80-110. Blood pressure remained stable. On arrival patient blood pressure was in the low 90s which improved with fluids. As per the family patient's blood pressure at times was running in the lower ranges. EKG was showing normal sinus rhythm with nonspecific ST-T changes and QTC of 453 milliseconds.   ED Course: CT head was showing right mastoid fracture. Neurology was consulted who recommended MRI of the brain. EKG was showing normal sinus rhythm. Patient was placed on fluids. Since patient is mildly hypoxic CT angiography of the chest was done which was negative for PE.  Review of Systems: As per HPI, rest all negative.   Past Medical History:  Diagnosis Date  . Alcohol abuse    Remote hx, stopped in 1970  . Anxiety   . BPH (benign prostatic hypertrophy) with urinary obstruction   . Colon polyps   . Depression   . Diverticulosis   . Glaucoma   . Headache     sinus headaches  . Hypercholesterolemia   . Hypertension   . Hypogonadism male   . OSA on CPAP   . Osteoarthritis   . Osteopenia   . Sleep apnea   . Vitamin D deficiency     Past Surgical History:  Procedure Laterality Date  . APPENDECTOMY  1945  . BACK SURGERY    . EYE SURGERY     bilateral cataracts  . LUMBAR LAMINECTOMY/DECOMPRESSION MICRODISCECTOMY Bilateral 11/11/2015   Procedure: Bilateral L4-5 Laminectomy;  Surgeon: Kary Kos, MD;  Location: [facility];  Service: Neurosurgery;  Laterality: Bilateral;  Bilateral L4-5 Laminectomy  . PARTIAL KNEE ARTHROPLASTY Left 02/25/2016   Procedure: LEFT UNICOMPARTMENTAL KNEE ARTHROPLASTY;  Surgeon: Mcarthur Rossetti, MD;  Location: [facility];  Service: Orthopedics;  Laterality: Left;  . REPLACEMENT UNICONDYLAR JOINT KNEE Left 02/25/2016  . SHOULDER SURGERY Right   . TONSILLECTOMY       reports that he quit smoking about 54 years ago. He has never used smokeless tobacco. He reports that he does not drink alcohol or use drugs.  Allergies  Allergen Reactions  . Penicillins Anaphylaxis, Hives and Other (See Comments)    Has patient had a PCN reaction causing immediate rash, facial/tongue/throat swelling, SOB or lightheadedness with hypotension: Yes Has patient had a PCN reaction causing severe rash involving mucus membranes or skin necrosis: No Has patient had a PCN reaction that required hospitalization Yes Has patient had a PCN reaction occurring within the last 10 years: No If all  of the above answers are "NO", then may proceed with Cephalosporin use.   . Simvastatin Other (See Comments)    Leg weakness    Family History  Problem Relation Age of Onset  . Stroke Neg Hx     Prior to Admission medications   Medication Sig Start Date End Date Taking? Authorizing Provider  amLODipine (NORVASC) 5 MG tablet Take 5 mg by mouth every evening.  09/17/15  Yes Historical Provider, MD  aspirin 325 MG tablet Take 650 mg by mouth every  evening.    Yes Historical Provider, MD  buPROPion (WELLBUTRIN SR) 150 MG 12 hr tablet Take 150 mg by mouth 2 (two) times daily.   Yes Historical Provider, MD  diazepam (VALIUM) 10 MG tablet Take 10 mg by mouth every 8 (eight) hours as needed for anxiety.    Yes Historical Provider, MD  doxepin (SINEQUAN) 75 MG capsule TAKE ONE CAPSULE BY MOUTH AT BEDTIME 01/22/16  Yes Larey Seat, MD  ergocalciferol (VITAMIN D2) 50000 UNITS capsule Take 50,000 Units by mouth every Monday.    Yes Historical Provider, MD  ezetimibe (ZETIA) 10 MG tablet Take 10 mg by mouth daily. 09/29/16  Yes Historical Provider, MD  finasteride (PROSCAR) 5 MG tablet Take 5 mg by mouth every morning.    Yes Historical Provider, MD  mirabegron ER (MYRBETRIQ) 25 MG TB24 tablet Take 25 mg by mouth daily.   Yes Historical Provider, MD  Multiple Vitamin (MULTIVITAMIN) capsule Take 1 capsule by mouth daily.    Yes Historical Provider, MD  OMEGA 3 1000 MG CAPS Take 1,000 mg by mouth 3 (three) times daily.    Yes Historical Provider, MD  OVER THE COUNTER MEDICATION Take 1 tablet by mouth daily. Niacin Flush Free 300-500mg    Yes Historical Provider, MD  Tamsulosin HCl (FLOMAX) 0.4 MG CAPS Take 0.8 mg by mouth daily after supper.    Yes Historical Provider, MD  Timolol Maleate PF 0.5 % SOLN Place 1 drop into both eyes every morning.   Yes Historical Provider, MD  traMADol (ULTRAM) 50 MG tablet Take 50-100 mg by mouth every 6 (six) hours as needed for severe pain.    Yes Historical Provider, MD  valsartan (DIOVAN) 320 MG tablet Take 320 mg by mouth every evening.  10/15/15  Yes Historical Provider, MD  vitamin C (ASCORBIC ACID) 500 MG tablet Take 500 mg by mouth daily.   Yes Historical Provider, MD  acetaminophen (TYLENOL) 500 MG tablet Take 500 mg by mouth every 6 (six) hours as needed (For pain.).    Historical Provider, MD    Physical Exam: Vitals:   10/29/16 0207 10/29/16 0230 10/29/16 0300 10/29/16 0330  BP: 158/77 161/70 147/77  130/79  Pulse: 80 77 80 78  Resp: 18 17 13 16   Temp:      TempSrc:      SpO2: 97% 97% 96% 96%  Weight:      Height:          Constitutional: Moderately built and nourished. Vitals:   10/29/16 0207 10/29/16 0230 10/29/16 0300 10/29/16 0330  BP: 158/77 161/70 147/77 130/79  Pulse: 80 77 80 78  Resp: 18 17 13 16   Temp:      TempSrc:      SpO2: 97% 97% 96% 96%  Weight:      Height:       Eyes: Anicteric no pallor. ENMT: No discharge from the ears eyes nose or mouth. Neck: No mass felt. No neck rigidity.  Respiratory: No rhonchi or crepitations. Cardiovascular: S1 and S2 heard no murmurs appreciated. Abdomen: Soft nontender bowel sounds present. No guarding or rigidity. Musculoskeletal: No edema. No joint effusion. Skin: No rash. Skin appears warm. Neurologic: Alert awake oriented to time place and person. Moves all extremities. Psychiatric: Appears normal. Normal affect.   Labs on Admission: I have personally reviewed following labs and imaging studies  CBC:  Recent Labs Lab 10/28/16 1808  WBC 12.8*  HGB 13.7  HCT 40.5  MCV 91.4  PLT [id number]   Basic Metabolic Panel:  Recent Labs Lab 10/28/16 1808  NA 137  K 3.8  CL 102  CO2 26  GLUCOSE 106*  BUN 27*  CREATININE 1.30*  CALCIUM 8.6*   GFR: Estimated Creatinine Clearance: 48.1 mL/min (by C-G formula based on SCr of 1.3 mg/dL (H)). Liver Function Tests: No results for input(s): AST, ALT, ALKPHOS, BILITOT, PROT, ALBUMIN in the last 168 hours. No results for input(s): LIPASE, AMYLASE in the last 168 hours. No results for input(s): AMMONIA in the last 168 hours. Coagulation Profile: No results for input(s): INR, PROTIME in the last 168 hours. Cardiac Enzymes: No results for input(s): CKTOTAL, CKMB, CKMBINDEX, TROPONINI in the last 168 hours. BNP (last 3 results) No results for input(s): PROBNP in the last 8760 hours. HbA1C: No results for input(s): HGBA1C in the last 72 hours. CBG:  Recent Labs Lab  10/28/16 1740  GLUCAP 110*   Lipid Profile: No results for input(s): CHOL, HDL, LDLCALC, TRIG, CHOLHDL, LDLDIRECT in the last 72 hours. Thyroid Function Tests: No results for input(s): TSH, T4TOTAL, FREET4, T3FREE, THYROIDAB in the last 72 hours. Anemia Panel: No results for input(s): VITAMINB12, FOLATE, FERRITIN, TIBC, IRON, RETICCTPCT in the last 72 hours. Urine analysis:    Component Value Date/Time   COLORURINE YELLOW 10/28/2016 0038   APPEARANCEUR CLEAR 10/28/2016 0038   LABSPEC 1.019 10/28/2016 0038   PHURINE 6.0 10/28/2016 0038   GLUCOSEU NEGATIVE 10/28/2016 0038   HGBUR NEGATIVE 10/28/2016 0038   BILIRUBINUR NEGATIVE 10/28/2016 0038   KETONESUR NEGATIVE 10/28/2016 0038   PROTEINUR NEGATIVE 10/28/2016 0038   UROBILINOGEN 0.2 06/03/2013 1033   NITRITE NEGATIVE 10/28/2016 0038   LEUKOCYTESUR NEGATIVE 10/28/2016 0038   Sepsis Labs: @LABRCNTIP (procalcitonin:4,lacticidven:4) )No results found for this or any previous visit (from the past 240 hour(s)).   Radiological Exams on Admission: Dg Chest 2 View  Result Date: 10/28/2016 CLINICAL DATA:  Dizziness and weakness. Fell backwards. Chest pain. EXAM: CHEST  2 VIEW COMPARISON:  06/03/2013 FINDINGS: Heart size is within normal limits. Aortic atherosclerosis. Both lungs are clear. No evidence of pneumothorax or pleural effusion. IMPRESSION: No active cardiopulmonary disease.  Aortic atherosclerosis. Electronically Signed   By: Earle Gell M.D.   On: 10/28/2016 18:33   Ct Head Wo Contrast  Result Date: 10/28/2016 CLINICAL DATA:  83 year old male with near syncope and fall. EXAM: CT HEAD WITHOUT CONTRAST CT CERVICAL SPINE WITHOUT CONTRAST TECHNIQUE: Multidetector CT imaging of the head and cervical spine was performed following the standard protocol without intravenous contrast. Multiplanar CT image reconstructions of the cervical spine were also generated. COMPARISON:  None. FINDINGS: CT HEAD FINDINGS Brain: No evidence of acute  infarction, hemorrhage, hydrocephalus, extra-axial collection or mass lesion/mass effect. Mild atrophy and chronic small-vessel white matter ischemic changes are noted. Vascular: Intracranial atherosclerotic calcifications noted. Skull: No acute fracture or focal lesion. Sinuses/Orbits: A nondisplaced transverse fracture of the right mastoid is noted (best seen on the coronal cervical spine images) with a right mastoid effusion.  Tiny amount of fluid in the left mastoids are noted. The paranasal sinuses are clear. Other: None CT CERVICAL SPINE FINDINGS Alignment: Normal. Skull base and vertebrae: No acute cervical spine fracture. No primary bone lesion or focal pathologic process. Soft tissues and spinal canal: No prevertebral fluid or swelling. No visible canal hematoma. Disc levels: Multilevel degenerative disc disease, spondylosis and facet arthropathy noted, mild to moderate at C3-4 and C4-5. Upper chest: Negative. Other: A nondisplaced transverse fracture of the right mastoid is noted (best seen on the coronal cervical spine images) with a right mastoid effusion. IMPRESSION: Nondisplaced transverse right mastoid fracture with right mastoid effusion (best seen on the coronal cervical spine images). Consider temporal bone CT for further characterization as clinically indicated. No evidence of acute intracranial abnormality. Chronic small-vessel white matter ischemic changes. No static evidence of acute injury to the cervical spine. Degenerative changes as described. Electronically Signed   By: Margarette Canada M.D.   On: 10/28/2016 19:10   Ct Angio Chest Pe W And/or Wo Contrast  Result Date: 10/28/2016 CLINICAL DATA:  Near syncope. Episodic lightheadedness over the past several days. Fell today. EXAM: CT ANGIOGRAPHY CHEST WITH CONTRAST TECHNIQUE: Multidetector CT imaging of the chest was performed using the standard protocol during bolus administration of intravenous contrast. Multiplanar CT image reconstructions  and MIPs were obtained to evaluate the vascular anatomy. CONTRAST:  100 mL Isovue 370 intravenous COMPARISON:  10/11/2015 FINDINGS: Cardiovascular: Satisfactory opacification of the pulmonary arteries to the segmental level. No evidence of pulmonary embolism. Normal heart size. No pericardial effusion. Thoracic aorta is normal in caliber with moderate atherosclerotic calcification. Extensive calcified coronary artery plaque. Mediastinum/Nodes: No enlarged mediastinal, hilar, or axillary lymph nodes. Thyroid gland, trachea, and esophagus demonstrate no significant findings. Lungs/Pleura: Linear scarring or atelectasis in the bases, similar to the 10/11/2015 examination. No pleural effusions. Airways are patent. Upper Abdomen: No acute abnormality. Musculoskeletal: No significant skeletal lesion. Review of the MIP images confirms the above findings. IMPRESSION: Negative for acute pulmonary embolism. Moderate linear scarring or atelectasis in both lung bases. Electronically Signed   By: Andreas Newport M.D.   On: 10/28/2016 22:55   Ct Cervical Spine Wo Contrast  Result Date: 10/28/2016 CLINICAL DATA:  83 year old male with near syncope and fall. EXAM: CT HEAD WITHOUT CONTRAST CT CERVICAL SPINE WITHOUT CONTRAST TECHNIQUE: Multidetector CT imaging of the head and cervical spine was performed following the standard protocol without intravenous contrast. Multiplanar CT image reconstructions of the cervical spine were also generated. COMPARISON:  None. FINDINGS: CT HEAD FINDINGS Brain: No evidence of acute infarction, hemorrhage, hydrocephalus, extra-axial collection or mass lesion/mass effect. Mild atrophy and chronic small-vessel white matter ischemic changes are noted. Vascular: Intracranial atherosclerotic calcifications noted. Skull: No acute fracture or focal lesion. Sinuses/Orbits: A nondisplaced transverse fracture of the right mastoid is noted (best seen on the coronal cervical spine images) with a right  mastoid effusion. Tiny amount of fluid in the left mastoids are noted. The paranasal sinuses are clear. Other: None CT CERVICAL SPINE FINDINGS Alignment: Normal. Skull base and vertebrae: No acute cervical spine fracture. No primary bone lesion or focal pathologic process. Soft tissues and spinal canal: No prevertebral fluid or swelling. No visible canal hematoma. Disc levels: Multilevel degenerative disc disease, spondylosis and facet arthropathy noted, mild to moderate at C3-4 and C4-5. Upper chest: Negative. Other: A nondisplaced transverse fracture of the right mastoid is noted (best seen on the coronal cervical spine images) with a right mastoid effusion. IMPRESSION: Nondisplaced transverse right  mastoid fracture with right mastoid effusion (best seen on the coronal cervical spine images). Consider temporal bone CT for further characterization as clinically indicated. No evidence of acute intracranial abnormality. Chronic small-vessel white matter ischemic changes. No static evidence of acute injury to the cervical spine. Degenerative changes as described. Electronically Signed   By: Margarette Canada M.D.   On: 10/28/2016 19:10    EKG: Independently reviewed. Normal sinus rhythm with nonspecific ST changes. QTC of 453 milliseconds.  Assessment/Plan Principal Problem:   Syncope Active Problems:   Hypertension   OSA treated with BiPAP   Dizziness    1. Syncope - patient appears to be orthostatic. We'll continue with hydration and check 2-D echo closely monitor in telemetry for any arrhythmias. Check cortisol levels cardiac markers. Hold antihypertensives for now. Check MRI brain EEG. 2. Right mastoid fracture - we'll get CT of the right temporal bone. Will need neurosurgical consult. Holding of aspirin or any DVT prophylaxis with anticoagulants until we get CT of the right temporal bone and MRI of the brain. 3. Hypertension - since patient is having episodes of hypotension holding antihypertensives and  keeping patient on when necessary IV hydralazine for systolic blood pressure more than 160. Follow blood pressure trends. 4. History of stroke - see #2 with regarding to aspirin. 5. Sleep apnea on BiPAP.  Addendum - CT of the right temporal bone shows fracture. Discussed with on-call neurosurgeon Dr. Ellene Route who will be seeing patient in consult.   DVT prophylaxis: SCDs for now. Until we get CT of the right temporal bone MRI brain. Code Status: Full code.  Family Communication: Patient's wife.  Disposition Plan: Home.  Consults called: ER physician discussed with neurologist.  Admission status: Observation.    Rise Patience MD [facility] Pager [phone].  If 7PM-7AM, please contact night-coverage [email or web address] Password [email or web address]  10/29/2016, 4:44 AM

## 2016-10-29 NOTE — Progress Notes (Signed)
When pt moved from chair to bed he reported the sensation of the ceiling swirling. 5 min later he reported nausea.[id number]

## 2016-10-30 DIAGNOSIS — R55 Syncope and collapse: Secondary | ICD-10-CM | POA: Diagnosis not present

## 2016-10-30 LAB — CBC WITH DIFFERENTIAL/PLATELET
BASOS ABS: 0 10*3/uL (ref 0.0–0.1)
BASOS PCT: 0 %
EOS ABS: 0.4 10*3/uL (ref 0.0–0.7)
EOS PCT: 4 %
HCT: 40.4 % (ref 39.0–52.0)
Hemoglobin: 13.4 g/dL (ref 13.0–17.0)
Lymphocytes Relative: 23 %
Lymphs Abs: 2.5 10*3/uL (ref 0.7–4.0)
MCH: 30.9 pg (ref 26.0–34.0)
MCHC: 33.2 g/dL (ref 30.0–36.0)
MCV: 93.3 fL (ref 78.0–100.0)
MONO ABS: 1.3 10*3/uL — AB (ref 0.1–1.0)
Monocytes Relative: 12 %
Neutro Abs: 6.6 10*3/uL (ref 1.7–7.7)
Neutrophils Relative %: 61 %
PLATELETS: 226 10*3/uL (ref 150–400)
RBC: 4.33 MIL/uL (ref 4.22–5.81)
RDW: 13.6 % (ref 11.5–15.5)
WBC: 10.8 10*3/uL — AB (ref 4.0–10.5)

## 2016-10-30 LAB — BASIC METABOLIC PANEL
Anion gap: 6 (ref 5–15)
BUN: 9 mg/dL (ref 6–20)
CALCIUM: 8.6 mg/dL — AB (ref 8.9–10.3)
CO2: 29 mmol/L (ref 22–32)
Chloride: 105 mmol/L (ref 101–111)
Creatinine, Ser: 0.75 mg/dL (ref 0.61–1.24)
GLUCOSE: 105 mg/dL — AB (ref 65–99)
Potassium: 3.7 mmol/L (ref 3.5–5.1)
SODIUM: 140 mmol/L (ref 135–145)

## 2016-10-30 MED ORDER — HEPARIN SODIUM (PORCINE) 5000 UNIT/ML IJ SOLN
5000.0000 [IU] | Freq: Three times a day (TID) | INTRAMUSCULAR | Status: DC
  Administered 2016-10-30 – 2016-10-31 (×4): 5000 [IU] via SUBCUTANEOUS
  Filled 2016-10-30 (×4): qty 1

## 2016-10-30 MED ORDER — MECLIZINE HCL 25 MG PO TABS
12.5000 mg | ORAL_TABLET | Freq: Two times a day (BID) | ORAL | Status: DC | PRN
Start: 2016-10-30 — End: 2016-10-30
  Administered 2016-10-30: 12.5 mg via ORAL
  Filled 2016-10-30: qty 1

## 2016-10-30 MED ORDER — TRAMADOL HCL 50 MG PO TABS
50.0000 mg | ORAL_TABLET | Freq: Four times a day (QID) | ORAL | Status: DC | PRN
  Administered 2016-10-30 – 2016-10-31 (×4): 50 mg via ORAL
  Filled 2016-10-30 (×4): qty 1

## 2016-10-30 MED ORDER — ASPIRIN 325 MG PO TABS
650.0000 mg | ORAL_TABLET | Freq: Every evening | ORAL | Status: DC
  Administered 2016-10-30: 650 mg via ORAL
  Filled 2016-10-30: qty 2

## 2016-10-30 MED ORDER — MECLIZINE HCL 25 MG PO TABS
12.5000 mg | ORAL_TABLET | Freq: Two times a day (BID) | ORAL | Status: DC
  Administered 2016-10-30 – 2016-10-31 (×2): 12.5 mg via ORAL
  Filled 2016-10-30 (×2): qty 1

## 2016-10-30 NOTE — Progress Notes (Signed)
Pt's wife selected [facility], referral given to in [facility] [phone]. Need orders and face to face for HHRN/HHPT.

## 2016-10-30 NOTE — Plan of Care (Signed)
Problem: Physical Regulation: Goal: Ability to maintain clinical measurements within normal limits will improve Outcome: Not Progressing Encourage pt to wake up and to ambulate and complete common ADLs.

## 2016-10-30 NOTE — Progress Notes (Signed)
Chaplain stopped by the room and met the patient and his wife.  Chaplain introduced herself to the patient and they immediately thanked me for stopping by.  Patient's wife explained to me what happened.  She also explained that Gerald Stabs (she called him) introduced her to this lovely store.  The store was his favorite store and now it's her favorite store and they were shopping there together when he had this episode.  Chaplain asked what store it was and she was told [employer].  Chaplain also shared how she like the store also.    Chaplain asked the patient was there anything he needed or could I do anything for him and he expressed that his head was hurting so bad and that he felt he needed something for his head.  They felt that the doctor would be there soon and that perhaps they would give him something.   Chaplain made patient and spouse aware of the services that are available and allowed the nurse that had arrived time to talk with the family since lunch had also arrived.  Berneice Heinrich Chaplain Resident Pager:  [phone]    10/30/16 1215  Clinical Encounter Type  Visited With Patient and family together  Visit Type Initial

## 2016-10-30 NOTE — Evaluation (Signed)
Occupational Therapy Evaluation Patient Details Name: Lance [name] MRN: [id number] DOB: [birth year] Today's Date: 10/30/2016    History of Present Illness 83 y.o. male with history of stroke, hypertension, lumbar decompression was brought to the ER after patient had a syncopal episode while shopping last afternoon.  CT: Right Acute temporal bone longitudinal fracture extending to temp endplate.  MRI: negative for acute stroke   Clinical Impression   Pt with decline in function and safety with ADLs and ADL mobility with decreased strength, balance and endurance. Pt would benefit from acute OT services to address impairments to increase level of function and safety    Follow Up Recommendations  Home health OT;Supervision/Assistance - 24 hour    Equipment Recommendations  Other (comment) (reacher)    Recommendations for Other Services       Precautions / Restrictions Precautions Precautions: Fall Restrictions Weight Bearing Restrictions: No      Mobility Bed Mobility Overal bed mobility: Needs Assistance Bed Mobility: Supine to Sit     Supine to sit: Min assist Sit to supine: Mod assist   General bed mobility comments: assist for trunk upright and LEs onto bed, no nystagmus observed but reports mild dizziness upon sitting EOB that subsided <1 minute  Transfers Overall transfer level: Needs assistance Equipment used: Rolling walker (2 wheeled) Transfers: Sit to/from Stand Sit to Stand: +2 safety/equipment;Mod assist         General transfer comment: no c/o of dizziness    Balance Overall balance assessment: Needs assistance Sitting-balance support: Feet supported;Bilateral upper extremity supported Sitting balance-Leahy Scale: Fair   Postural control: Right lateral lean Standing balance support: Single extremity supported;Bilateral upper extremity supported;During functional activity Standing balance-Leahy Scale: Poor Standing balance comment: pt  leaning to R side                            ADL Overall ADL's : Needs assistance/impaired     Grooming: Wash/dry hands;Wash/dry face;Min guard;Sitting   Upper Body Bathing: Min guard;Sitting   Lower Body Bathing: Maximal assistance   Upper Body Dressing : Min guard;Sitting   Lower Body Dressing: Total assistance   Toilet Transfer: Moderate assistance;+2 for safety/equipment;Ambulation;RW;Minimal assistance Toilet Transfer Details (indicate cue type and reason): simulated bed - recliner Toileting- Clothing Manipulation and Hygiene: Total assistance       Functional mobility during ADLs: Minimal assistance;Moderate assistance;+2 for safety/equipment;Rolling walker General ADL Comments: educated pt an dwife on ADL A/E for home use     Vision Vision Assessment?: No apparent visual deficits              Pertinent Vitals/Pain Pain Assessment: No/denies pain Faces Pain Scale: Hurts even more Pain Location: head Pain Descriptors / Indicators: Aching Pain Intervention(s): Monitored during session     Hand Dominance Right   Extremity/Trunk Assessment Upper Extremity Assessment Upper Extremity Assessment: Generalized weakness   Lower Extremity Assessment Lower Extremity Assessment: Defer to PT evaluation   Cervical / Trunk Assessment Cervical / Trunk Assessment: Normal   Communication Communication Communication: HOH   Cognition Arousal/Alertness: Awake/alert Behavior During Therapy: WFL for tasks assessed/performed Overall Cognitive Status: Within Functional Limits for tasks assessed                     General Comments   pt pleasant and cooperative, wife very supportive                 Home Living Family/patient expects to  be discharged to:: Private residence Living Arrangements: Spouse/significant other Available Help at Discharge: Family;Available 24 hours/day Type of Home: House Home Access: Stairs to enter [employer] of Steps: 2 Entrance Stairs-Rails: Right;Left;Can reach both Home Layout: One level     Bathroom Shower/Tub: Occupational psychologist: Standard     Home Equipment: Environmental consultant - 2 wheels;Cane - single point;Shower seat;Grab bars - tub/shower;Bedside commode          Prior Functioning/Environment Level of Independence: Independent with assistive device(s)                 OT Problem List: Decreased activity tolerance;Impaired balance (sitting and/or standing);Decreased knowledge of use of DME or AE;Decreased safety awareness   OT Treatment/Interventions: Self-care/ADL training;DME and/or AE instruction;Therapeutic activities;Patient/family education;Therapeutic exercise;Neuromuscular education    OT Goals(Current goals can be found in the care plan section) Acute Rehab OT Goals Patient Stated Goal: go home OT Goal Formulation: With patient/family Time For Goal Achievement: 11/06/16 Potential to Achieve Goals: Good ADL Goals Pt Will Perform Grooming: with supervision;with set-up;sitting Pt Will Perform Upper Body Bathing: with supervision;with set-up;sitting Pt Will Perform Lower Body Bathing: with mod assist;with caregiver independent in assisting Pt Will Perform Upper Body Dressing: with set-up;with supervision;sitting Pt Will Perform Lower Body Dressing: with mod assist;with max assist;with caregiver independent in assisting Pt Will Transfer to Toilet: with min assist;ambulating;bedside commode Pt Will Perform Toileting - Clothing Manipulation and hygiene: with max assist;with mod assist;with caregiver independent in assisting Pt Will Perform Tub/Shower Transfer: with min assist;with min guard assist;3 in 1;shower seat;with caregiver independent in assisting;rolling walker;ambulating  OT Frequency: Min 2X/week   Barriers to D/C:  pt and wife adamant about returning home, pt requires extensive assist with LB ADLs                        End of  Session Equipment Utilized During Treatment: Gait belt;Rolling walker  Activity Tolerance: Patient tolerated treatment well Patient left: in chair;with call bell/phone within reach;with chair alarm set;with family/visitor present   Time: 1257-1341 OT Time Calculation (min): 44 min Charges:  OT General Charges $OT Visit: 1 Procedure OT Evaluation $OT Eval Moderate Complexity: 1 Procedure OT Treatments $Self Care/Home Management : 8-22 mins $Therapeutic Activity: 8-22 mins G-Codes: OT G-codes **NOT FOR INPATIENT CLASS** Functional Assessment Tool Used: clinical judgement Functional Limitation: Self care Self Care Current Status [id number]): At least 40 percent but less than 60 percent impaired, limited or restricted Self Care Goal Status [id number]): At least 20 percent but less than 40 percent impaired, limited or restricted  Britt Bottom 10/30/2016, 2:22 PM

## 2016-10-30 NOTE — Evaluation (Signed)
Physical Therapy Evaluation Patient Details Name: Lance [name] MRN: [id number] DOB: [birth year] Today's Date: 10/30/2016   History of Present Illness  83 y.o. male with history of stroke, hypertension, lumbar decompression was brought to the ER after patient had a syncopal episode while shopping last afternoon.  CT: Right Acute temporal bone longitudinal fracture extending to temp endplate.  MRI: negative for acute stroke  Clinical Impression  Pt admitted with above diagnosis. Pt currently with functional limitations due to the deficits listed below (see PT Problem List).  Pt will benefit from skilled PT to increase their independence and safety with mobility to allow discharge to the venue listed below.  Spouse reports pt has been complaining of occasional dizziness for a few weeks prior to admission.  Pt assisted to standing and orthostatics obtained (+).  Pt felt too dizzy and off balance to ambulate at this time.  Nystagmus observed upon laying trunk down returning to supine (lasting only approx 1 minute).  Pt able to look R, L, up, down without provoking dizziness or nystagmus as well as slowly turn head R and L (in supine).  Will continue to follow in acute.  Pt may require further vestibular testing if warranted (will await medical interventions first).  Spouse anticipates pt will be closer to his independent baseline upon d/c and wishes for pt to return home.     Follow Up Recommendations Home health PT;Supervision/Assistance - 24 hour (depending on progress)    Equipment Recommendations  None recommended by PT    Recommendations for Other Services       Precautions / Restrictions Precautions Precautions: Fall      Mobility  Bed Mobility Overal bed mobility: Needs Assistance Bed Mobility: Sit to Supine;Supine to Sit     Supine to sit: Min assist Sit to supine: Mod assist   General bed mobility comments: assist for trunk upright and LEs onto bed, nystagmus observed upon  returning to supine lasting approx 1 min (difficult to visualize due to pt's eyes closing)  Transfers Overall transfer level: Needs assistance Equipment used: Rolling walker (2 wheeled) Transfers: Sit to/from Stand Sit to Stand: Min assist;+2 safety/equipment         General transfer comment: assisted NT obtain orthostatic vitals (NT documentated in flowsheets), orthostatics (+) and pt symptomatic so returned to supine, pt did not wish to even attempt ambulation as well  Ambulation/Gait                Stairs            Wheelchair Mobility    Modified Rankin (Stroke Patients Only)       Balance Overall balance assessment: Needs assistance         Standing balance support: Bilateral upper extremity supported Standing balance-Leahy Scale: Zero Standing balance comment: reports feeling off balance, requiring UE support and external assist at this time                             Pertinent Vitals/Pain Pain Assessment: Faces Faces Pain Scale: Hurts even more Pain Location: head Pain Descriptors / Indicators: Aching Pain Intervention(s): Limited activity within patient's tolerance;Monitored during session (RN aware)    Home Living Family/patient expects to be discharged to:: Private residence Living Arrangements: Spouse/significant other Available Help at Discharge: Family;Available 24 hours/day Type of Home: House Home Access: Stairs to enter Entrance Stairs-Rails: Right;Left;Can reach both Entrance Stairs-Number of Steps: 2 Home Layout: One level Home Equipment:  Walker - 2 wheels;Cane - single point      Prior Function Level of Independence: Independent               Hand Dominance        Extremity/Trunk Assessment        Lower Extremity Assessment Lower Extremity Assessment: Overall WFL for tasks assessed       Communication   Communication: HOH  Cognition Arousal/Alertness: Awake/alert Behavior During Therapy: WFL  for tasks assessed/performed Overall Cognitive Status: Within Functional Limits for tasks assessed                      General Comments      Exercises     Assessment/Plan    PT Assessment Patient needs continued PT services  PT Problem List Decreased activity tolerance;Decreased balance;Decreased mobility          PT Treatment Interventions DME instruction;Gait training;Functional mobility training;Balance training;Therapeutic exercise;Therapeutic activities;Patient/family education (possible vestibular interventions if warranted)    PT Goals (Current goals can be found in the Care Plan section)  Acute Rehab PT Goals PT Goal Formulation: With patient Time For Goal Achievement: 11/06/16 Potential to Achieve Goals: Good    Frequency Min 3X/week   Barriers to discharge        Co-evaluation               End of Session   Activity Tolerance: Other (comment) (limited by dizziness) Patient left: with call bell/phone within reach;in bed;with bed alarm set;with family/visitor present Nurse Communication: Mobility status    Functional Assessment Tool Used: clinical judgement Functional Limitation: Mobility: Walking and moving around Mobility: Walking and Moving Around Current Status [id number]): At least 20 percent but less than 40 percent impaired, limited or restricted Mobility: Walking and Moving Around Goal Status [id number]): At least 1 percent but less than 20 percent impaired, limited or restricted    Time: 1004-1023 PT Time Calculation (min) (ACUTE ONLY): 19 min   Charges:   PT Evaluation $PT Eval Moderate Complexity: 1 Procedure     PT G Codes:   PT G-Codes **NOT FOR INPATIENT CLASS** Functional Assessment Tool Used: clinical judgement Functional Limitation: Mobility: Walking and moving around Mobility: Walking and Moving Around Current Status [id number]): At least 20 percent but less than 40 percent impaired, limited or restricted Mobility: Walking and  Moving Around Goal Status [id number]): At least 1 percent but less than 20 percent impaired, limited or restricted    Sanari Offner,KATHrine E 10/30/2016, 11:24 AM Carmelia Bake, PT, DPT 10/30/2016 Pager: [phone]

## 2016-10-30 NOTE — Procedures (Signed)
Patient stated he did not want to wear CPAP tonight.  Patient is aware if he changes his mind to ask RN to call Respiratory.

## 2016-10-30 NOTE — Progress Notes (Signed)
PROGRESS NOTE    Lance [name]  [id number] DOB: 1932-08-04 DOA: 10/28/2016 PCP: Jerlyn Ly, MD     Brief Narrative:  Lance [name] is a 83 y.o. male with history of stroke, hypertension was brought to the ER after patient had a syncopal episode while shopping last afternoon. As per the patient and his wife patient has been feeling dizzy off and on for last 1 month. Patient gets dizzy particularly on standing. Yesterday patient had suddenly lost consciousness while shopping. The episode lasted for a few seconds. Patient did not have any incontinence of urine or tongue bite. Does not remember the incident. He did hit his head and had a wound. CT scan of the head did not show anything acute except for the right mastoid fracture. Patient had a similar episode in 2014 when patient had stroke. On-call neurologist Dr. Cheral Marker was consulted by the ER physician. Neurologist advised MRI brain. Patient on standing was found to get tachycardic heart rate increased from 80-110. Blood pressure remained stable. On arrival patient blood pressure was in the low 90s which improved with fluids. As per the family patient's blood pressure at times was running in the lower ranges. EKG was showing normal sinus rhythm with nonspecific ST-T changes and QTC of 453 milliseconds. Patient was admitted for further evaluation of his syncope.  Assessment & Plan:   Principal Problem:   Syncope Active Problems:   Hypertension   OSA treated with BiPAP   Dizziness  Syncope -Likely orthostatic etiology. Orthostatic is positive and he takes norvasc, doxepin, proscar, flomax, diovan at home. Will hold these medications which can all affect BP -Check orthostatic vital signs daily -MRI brain negative for stroke  -Echo showed normal LV systolic function; grade 1 diastolic dysfunction; mild LVH; trace AI  Vertigo -Antivert BID  -Vestibular PT   Right temporal bone fracture -Dr. Ellene Route with neurosurgery  reviewed the scans, supportive care at this time  Essential hypertension -Hold the antihypertensives at this time  History of stroke -Continue aspirin, zetia   Sleep apnea -BiPAP at nighttime   DVT prophylaxis: subq hep Code Status: full Family Communication: wife at bedsid Disposition Plan: pending further improvement, suspect back home   Consultants:   None  Procedures:   None  Antimicrobials:   None    Subjective: Patient feeling well overall. Continues to be very dizzy with movement. He worked with physical therapy, but was unable to ambulate much due to dizziness. He was orthostatic positive again this morning. Denies any chest pain or shortness of breath. Also complains of bilateral temporal headache, not relieved with Tylenol.  Objective: Vitals:   10/29/16 1413 10/29/16 2026 10/30/16 0631 10/30/16 0633  BP: (!) 150/68 (!) 167/76 (!) 150/82   Pulse: 76 87 83   Resp: 18 18 18    Temp: 98.3 F (36.8 C) 97.8 F (36.6 C) 97.9 F (36.6 C)   TempSrc: Oral Oral Oral   SpO2: 99% 97% 96%   Weight:    97.2 kg (214 lb 4.6 oz)  Height:        Intake/Output Summary (Last 24 hours) at 10/30/16 1254 Last data filed at 10/30/16 0256  Gross per 24 hour  Intake             1400 ml  Output              675 ml  Net              725 ml   [employer]  10/28/16 1724 10/30/16 0633  Weight: 92.5 kg (204 lb) 97.2 kg (214 lb 4.6 oz)    Examination:  General exam: Appears calm and comfortable  Respiratory system: Clear to auscultation. Respiratory effort normal. Cardiovascular system: S1 & S2 heard, RRR. No JVD, murmurs, rubs, gallops or clicks. No pedal edema. Gastrointestinal system: Abdomen is nondistended, soft and nontender. No organomegaly or masses felt. Normal bowel sounds heard. Central nervous system: Alert and oriented. No focal neurological deficits. Extremities: Symmetric 5 x 5 power. Skin: No rashes, lesions or ulcers Psychiatry: Judgement and insight  appear normal. Mood & affect appropriate.   Data Reviewed: I have personally reviewed following labs and imaging studies  CBC:  Recent Labs Lab 10/28/16 1808 10/29/16 0519 10/30/16 0529  WBC 12.8* 11.0* 10.8*  NEUTROABS  --   --  6.6  HGB 13.7 13.9 13.4  HCT 40.5 39.8 40.4  MCV 91.4 89.8 93.3  PLT 224 215 [id number]   Basic Metabolic Panel:  Recent Labs Lab 10/28/16 1808 10/29/16 0519 10/30/16 0529  NA 137 141 140  K 3.8 3.8 3.7  CL 102 106 105  CO2 26 28 29   GLUCOSE 106* 134* 105*  BUN 27* 18 9  CREATININE 1.30* 0.86 0.75  CALCIUM 8.6* 8.6* 8.6*   GFR: Estimated Creatinine Clearance: 86 mL/min (by C-G formula based on SCr of 0.75 mg/dL). Liver Function Tests:  Recent Labs Lab 10/29/16 0519  AST 16  ALT 12*  ALKPHOS 55  BILITOT 0.6  PROT 6.5  ALBUMIN 3.4*   No results for input(s): LIPASE, AMYLASE in the last 168 hours. No results for input(s): AMMONIA in the last 168 hours. Coagulation Profile: No results for input(s): INR, PROTIME in the last 168 hours. Cardiac Enzymes:  Recent Labs Lab 10/29/16 0519  TROPONINI <0.03   BNP (last 3 results) No results for input(s): PROBNP in the last 8760 hours. HbA1C: No results for input(s): HGBA1C in the last 72 hours. CBG:  Recent Labs Lab 10/28/16 1740  GLUCAP 110*   Lipid Profile: No results for input(s): CHOL, HDL, LDLCALC, TRIG, CHOLHDL, LDLDIRECT in the last 72 hours. Thyroid Function Tests: No results for input(s): TSH, T4TOTAL, FREET4, T3FREE, THYROIDAB in the last 72 hours. Anemia Panel: No results for input(s): VITAMINB12, FOLATE, FERRITIN, TIBC, IRON, RETICCTPCT in the last 72 hours. Sepsis Labs: No results for input(s): PROCALCITON, LATICACIDVEN in the last 168 hours.  No results found for this or any previous visit (from the past 240 hour(s)).     Radiology Studies: Dg Chest 2 View  Result Date: 10/28/2016 CLINICAL DATA:  Dizziness and weakness. Fell backwards. Chest pain. EXAM: CHEST  2  VIEW COMPARISON:  06/03/2013 FINDINGS: Heart size is within normal limits. Aortic atherosclerosis. Both lungs are clear. No evidence of pneumothorax or pleural effusion. IMPRESSION: No active cardiopulmonary disease.  Aortic atherosclerosis. Electronically Signed   By: Earle Gell M.D.   On: 10/28/2016 18:33   Ct Head Wo Contrast  Result Date: 10/28/2016 CLINICAL DATA:  83 year old male with near syncope and fall. EXAM: CT HEAD WITHOUT CONTRAST CT CERVICAL SPINE WITHOUT CONTRAST TECHNIQUE: Multidetector CT imaging of the head and cervical spine was performed following the standard protocol without intravenous contrast. Multiplanar CT image reconstructions of the cervical spine were also generated. COMPARISON:  None. FINDINGS: CT HEAD FINDINGS Brain: No evidence of acute infarction, hemorrhage, hydrocephalus, extra-axial collection or mass lesion/mass effect. Mild atrophy and chronic small-vessel white matter ischemic changes are noted. Vascular: Intracranial atherosclerotic calcifications noted. Skull: No  acute fracture or focal lesion. Sinuses/Orbits: A nondisplaced transverse fracture of the right mastoid is noted (best seen on the coronal cervical spine images) with a right mastoid effusion. Tiny amount of fluid in the left mastoids are noted. The paranasal sinuses are clear. Other: None CT CERVICAL SPINE FINDINGS Alignment: Normal. Skull base and vertebrae: No acute cervical spine fracture. No primary bone lesion or focal pathologic process. Soft tissues and spinal canal: No prevertebral fluid or swelling. No visible canal hematoma. Disc levels: Multilevel degenerative disc disease, spondylosis and facet arthropathy noted, mild to moderate at C3-4 and C4-5. Upper chest: Negative. Other: A nondisplaced transverse fracture of the right mastoid is noted (best seen on the coronal cervical spine images) with a right mastoid effusion. IMPRESSION: Nondisplaced transverse right mastoid fracture with right mastoid  effusion (best seen on the coronal cervical spine images). Consider temporal bone CT for further characterization as clinically indicated. No evidence of acute intracranial abnormality. Chronic small-vessel white matter ischemic changes. No static evidence of acute injury to the cervical spine. Degenerative changes as described. Electronically Signed   By: Margarette Canada M.D.   On: 10/28/2016 19:10   Ct Angio Chest Pe W And/or Wo Contrast  Result Date: 10/28/2016 CLINICAL DATA:  Near syncope. Episodic lightheadedness over the past several days. Fell today. EXAM: CT ANGIOGRAPHY CHEST WITH CONTRAST TECHNIQUE: Multidetector CT imaging of the chest was performed using the standard protocol during bolus administration of intravenous contrast. Multiplanar CT image reconstructions and MIPs were obtained to evaluate the vascular anatomy. CONTRAST:  100 mL Isovue 370 intravenous COMPARISON:  10/11/2015 FINDINGS: Cardiovascular: Satisfactory opacification of the pulmonary arteries to the segmental level. No evidence of pulmonary embolism. Normal heart size. No pericardial effusion. Thoracic aorta is normal in caliber with moderate atherosclerotic calcification. Extensive calcified coronary artery plaque. Mediastinum/Nodes: No enlarged mediastinal, hilar, or axillary lymph nodes. Thyroid gland, trachea, and esophagus demonstrate no significant findings. Lungs/Pleura: Linear scarring or atelectasis in the bases, similar to the 10/11/2015 examination. No pleural effusions. Airways are patent. Upper Abdomen: No acute abnormality. Musculoskeletal: No significant skeletal lesion. Review of the MIP images confirms the above findings. IMPRESSION: Negative for acute pulmonary embolism. Moderate linear scarring or atelectasis in both lung bases. Electronically Signed   By: Andreas Newport M.D.   On: 10/28/2016 22:55   Ct Cervical Spine Wo Contrast  Result Date: 10/28/2016 CLINICAL DATA:  83 year old male with near syncope and  fall. EXAM: CT HEAD WITHOUT CONTRAST CT CERVICAL SPINE WITHOUT CONTRAST TECHNIQUE: Multidetector CT imaging of the head and cervical spine was performed following the standard protocol without intravenous contrast. Multiplanar CT image reconstructions of the cervical spine were also generated. COMPARISON:  None. FINDINGS: CT HEAD FINDINGS Brain: No evidence of acute infarction, hemorrhage, hydrocephalus, extra-axial collection or mass lesion/mass effect. Mild atrophy and chronic small-vessel white matter ischemic changes are noted. Vascular: Intracranial atherosclerotic calcifications noted. Skull: No acute fracture or focal lesion. Sinuses/Orbits: A nondisplaced transverse fracture of the right mastoid is noted (best seen on the coronal cervical spine images) with a right mastoid effusion. Tiny amount of fluid in the left mastoids are noted. The paranasal sinuses are clear. Other: None CT CERVICAL SPINE FINDINGS Alignment: Normal. Skull base and vertebrae: No acute cervical spine fracture. No primary bone lesion or focal pathologic process. Soft tissues and spinal canal: No prevertebral fluid or swelling. No visible canal hematoma. Disc levels: Multilevel degenerative disc disease, spondylosis and facet arthropathy noted, mild to moderate at C3-4 and C4-5. Upper chest:  Negative. Other: A nondisplaced transverse fracture of the right mastoid is noted (best seen on the coronal cervical spine images) with a right mastoid effusion. IMPRESSION: Nondisplaced transverse right mastoid fracture with right mastoid effusion (best seen on the coronal cervical spine images). Consider temporal bone CT for further characterization as clinically indicated. No evidence of acute intracranial abnormality. Chronic small-vessel white matter ischemic changes. No static evidence of acute injury to the cervical spine. Degenerative changes as described. Electronically Signed   By: Margarette Canada M.D.   On: 10/28/2016 19:10   Mr Brain Wo  Contrast  Result Date: 10/29/2016 CLINICAL DATA:  Dizziness.  Question cerebellar stroke. EXAM: MRI HEAD WITHOUT CONTRAST TECHNIQUE: Multiplanar, multiecho pulse sequences of the brain and surrounding structures were obtained without intravenous contrast. COMPARISON:  CT head 10/28/2016 FINDINGS: Brain: Negative for acute infarct. Chronic microvascular ischemic change in the white matter. Generalized atrophy. Several foci of chronic microhemorrhage in the brain bilaterally. Negative for mass or edema. No shift of the midline structures. Pituitary not enlarged. Vascular: Normal arterial flow voids. Skull and upper cervical spine: Negative Sinuses/Orbits: Paranasal sinuses clear. Bilateral mastoid effusion. Bilateral lens replacement. No orbital mass lesion. Other: None IMPRESSION: Negative for acute infarct. Atrophy and chronic microvascular ischemia. Several foci of chronic microhemorrhage in the brain which are widely scattered and may be due to hypertension. Electronically Signed   By: Franchot Gallo M.D.   On: 10/29/2016 07:25   Ct Temporal Bones Wo Contrast  Result Date: 10/29/2016 CLINICAL DATA:  Follow-up mastoid fracture.  Near syncope and fall. EXAM: CT TEMPORAL BONES WITHOUT CONTRAST TECHNIQUE: Axial and coronal plane CT imaging of the petrous temporal bones was performed with thin-collimation image reconstruction. No intravenous contrast was administered. Multiplanar CT image reconstructions were also generated. COMPARISON:  CT HEAD and cervical spine October 28, 2016 FINDINGS: RIGHT: Small amount of blood products external auditory canal. Fracture through the tympanic plate and posterior wall of the RIGHT external auditory canal. Small amount of blood products within the middle ear, including Prussak's space. Scutum remain sharp. Blood products and aditus ad antrum and mastoid air cells. Intact tegmen tympani. Intact otic capsule with normal appearance of the inner ear structures. No internal auditory  canal expansion. No definite cerebellar pontine angle masses. Subcutaneous gas RIGHT skullbase. LEFT: External auditory canal is well formed, minimal debris. Tympanic membrane is not thickened or retracted. The scutum is sharp. Well aerated middle ear including Prussak's space. Ossicles are well formed and located. Patent aditus ad antrum. Minimal LEFT mastoid effusion without coalescence. Intact tegmen tympani. Intact otic capsule with normal appearance of the inner ear structures. No internal auditory canal expansion. No definite cerebellar pontine angle masses. Minimal extra-axial pneumocephalus within the RIGHT transverse sinus. IMPRESSION: RIGHT: Acute temporal bone longitudinal fracture extending to temp endplate. No ossicular dislocation. Trace pneumocephalus. LEFT:  Small LEFT mastoid effusion. Electronically Signed   By: Elon Alas M.D.   On: 10/29/2016 06:22      Scheduled Meds: . buPROPion  150 mg Oral BID  . chlorhexidine  15 mL Mouth Rinse BID  . doxepin  75 mg Oral QHS  . ezetimibe  10 mg Oral Daily  . meclizine  12.5 mg Oral BID  . mouth rinse  15 mL Mouth Rinse q12n4p  . mirabegron ER  25 mg Oral Daily  . multivitamin with minerals  1 tablet Oral Daily  . omega-3 acid ethyl esters  1,000 mg Oral TID  . vitamin C  500 mg Oral  Daily  . [START ON 11/02/2016] Vitamin D (Ergocalciferol)  50,000 Units Oral Q Mon   Continuous Infusions:   LOS: 0 days    Time spent: 30 minutes   Dessa Phi, DO [facility] [email or web address] Password [facility] 10/30/2016, 12:54 PM

## 2016-10-30 NOTE — Care Management Obs Status (Signed)
[place] NOTIFICATION   Patient Details  Name: Lance [name] MRN: [id number] Date of Birth: 06-27-1932   Medicare Observation Status Notification Given:  Yes, to pt's wife. Pt's wife states, "He is IP and I will fight this."    Advaith Lamarque, RN 10/30/2016, 12:30 PM

## 2016-10-31 DIAGNOSIS — R55 Syncope and collapse: Secondary | ICD-10-CM | POA: Diagnosis not present

## 2016-10-31 MED ORDER — KETOROLAC TROMETHAMINE 10 MG PO TABS
10.0000 mg | ORAL_TABLET | Freq: Four times a day (QID) | ORAL | Status: DC | PRN
  Filled 2016-10-31 (×2): qty 1

## 2016-10-31 MED ORDER — KETOROLAC TROMETHAMINE 10 MG PO TABS: 10.0000 mg | ORAL_TABLET | Freq: Four times a day (QID) | ORAL | 0 refills | Status: DC | PRN

## 2016-10-31 MED ORDER — MECLIZINE HCL 12.5 MG PO TABS: 12.5000 mg | ORAL_TABLET | Freq: Two times a day (BID) | ORAL | 0 refills | Status: DC

## 2016-10-31 NOTE — Progress Notes (Signed)
Physical Therapy Treatment Patient Details Name: Lance [name] MRN: [id number] DOB: [birth year] Today's Date: 10/31/2016    History of Present Illness 83 y.o. male with history of stroke, hypertension, lumbar decompression was brought to the ER after patient had a syncopal episode while shopping last afternoon.  CT: Right Acute temporal bone longitudinal fracture extending to temp endplate.  MRI: negative for acute stroke    PT Comments    OT to see pt later today to continue with vestibular assessment. Pt required Min assist for mobility-walked ~115 feet with a RW. LOB repeatedly towards R side during ambulation-external assist required to prevent fall. Pt c/o mild dizziness and R ear "sloshing" during ambulating. Pt reports head pain was better than it was last night. Discussed d/c plan-pt states his sons will be coming in town to assist as needed at home. Highly recommend sons be available and present to assist pt into home and for safe mobility inside home before discharging pt. Do not feel pt's wife can safely manage with pt by herself at this time.     Follow Up Recommendations  Home health PT;Supervision/Assistance - 24 hour     Equipment Recommendations  None recommended by PT    Recommendations for Other Services       Precautions / Restrictions Precautions Precautions: Fall Restrictions Weight Bearing Restrictions: No    Mobility  Bed Mobility Overal bed mobility: Needs Assistance Bed Mobility: Supine to Sit     Supine to sit: Min assist     General bed mobility comments: assist for trunk upright. Pt c/o mild dizziness (no spinning)  Transfers Overall transfer level: Needs assistance Equipment used: Rolling walker (2 wheeled) Transfers: Sit to/from Stand Sit to Stand: Min assist;From elevated surface         General transfer comment: Assist to rise, stabilize, control descent. VCs hand placement. Pt c/o mild dizziness (no  spinning)  Ambulation/Gait Ambulation/Gait assistance: Min assist Ambulation Distance (Feet): 115 Feet Assistive device: Rolling walker (2 wheeled) Gait Pattern/deviations: Step-through pattern;Decreased stride length     General Gait Details: Assist to stabilize pt and maneuver with RW. LOB repeatedly towards R side-required assist to prevent fall. Pt c/o mild dizziness and R ear "sloshing"(no spinning)   Stairs            Wheelchair Mobility    Modified Rankin (Stroke Patients Only)       Balance Overall balance assessment: Needs assistance   Sitting balance-Leahy Scale: Good   Postural control: Right lateral lean Standing balance support: Bilateral upper extremity supported Standing balance-Leahy Scale: Poor Standing balance comment: Leaning and loss of balance repeatedly towards R side.                     Cognition Arousal/Alertness: Awake/alert Behavior During Therapy: WFL for tasks assessed/performed Overall Cognitive Status: Within Functional Limits for tasks assessed                      Exercises      General Comments        Pertinent Vitals/Pain Pain Assessment: 0-10 Pain Score: 5  Pain Location: head Pain Descriptors / Indicators: Aching Pain Intervention(s): Limited activity within patient's tolerance;Repositioned    Home Living                      Prior Function            PT Goals (current goals can now be found in  the care plan section)      Frequency    Min 3X/week      PT Plan Current plan remains appropriate    Co-evaluation             End of Session Equipment Utilized During Treatment: Gait belt Activity Tolerance: Patient tolerated treatment well Patient left: in chair;with call bell/phone within reach;with family/visitor present;with chair alarm set     Time: [id number] PT Time Calculation (min) (ACUTE ONLY): 29 min  Charges:  $Gait Training: 8-22 mins $Therapeutic Activity:  8-22 mins                    G Codes:      Weston Anna, MPT Pager: [phone]

## 2016-10-31 NOTE — Progress Notes (Signed)
PROGRESS NOTE    [name]  [id number] DOB: 1932-08-19 DOA: 10/28/2016 PCP: Jerlyn Ly, MD     Brief Narrative:  Lance [name] is a 83 y.o. male with history of stroke, hypertension was brought to the ER after patient had a syncopal episode while shopping last afternoon. As per the patient and his wife patient has been feeling dizzy off and on for last 1 month. Patient gets dizzy particularly on standing. Yesterday patient had suddenly lost consciousness while shopping. The episode lasted for a few seconds. Patient did not have any incontinence of urine or tongue bite. Does not remember the incident. He did hit his head and had a wound. CT scan of the head did not show anything acute except for the right mastoid fracture. Patient had a similar episode in 2014 when patient had stroke. On-call neurologist Dr. Cheral Marker was consulted by the ER physician. Neurologist advised MRI brain. Patient on standing was found to get tachycardic heart rate increased from 80-110. Blood pressure remained stable. On arrival patient blood pressure was in the low 90s which improved with fluids. As per the family patient's blood pressure at times was running in the lower ranges. EKG was showing normal sinus rhythm with nonspecific ST-T changes and QTC of 453 milliseconds. Patient was admitted for further evaluation of his syncope.  Assessment & Plan:   Principal Problem:   Syncope Active Problems:   Hypertension   OSA treated with BiPAP   Dizziness  Syncope -Likely orthostatic etiology. Orthostatic was positive and he takes norvasc, doxepin, proscar, flomax, diovan at home. Will hold these medications which can all affect Bp. Orthostatic VS negative today  -MRI brain negative for stroke  -Echo showed normal LV systolic function; grade 1 diastolic dysfunction; mild LVH; trace AI  Vertigo -Antivert BID  -Vestibular PT/OT eval pending   Right temporal bone fracture -Dr. Ellene Route with  neurosurgery reviewed the scans, supportive care at this time -Ultram and Toradol prn for headache   Essential hypertension -Hold the antihypertensives at this time  History of stroke -Continue aspirin, zetia   Sleep apnea -BiPAP at nighttime   DVT prophylaxis: subq hep Code Status: full Family Communication: wife at bedside Disposition Plan: pending further improvement, suspect back home   Consultants:   None  Procedures:   None  Antimicrobials:   None    Subjective: Patient feeling well overall. He was able to get to chair this morning without dizziness. Orthostatic VS was negative today. Still has headache, which is minimally improved with ultram.    Objective: Vitals:   10/30/16 1430 10/30/16 2115 10/30/16 2200 10/31/16 0450  BP: (!) 138/96 (!) 163/84  125/86  Pulse: (!) 105 95  (!) 105  Resp: 18 18  18   Temp: 98.2 F (36.8 C) 98.3 F (36.8 C) 99 F (37.2 C) 98.6 F (37 C)  TempSrc: Oral Oral  Oral  SpO2: 94% 93%  95%  Weight:      Height:        Intake/Output Summary (Last 24 hours) at 10/31/16 1411 Last data filed at 10/31/16 0900  Gross per 24 hour  Intake              240 ml  Output              675 ml  Net             -435 ml   Filed Weights   10/28/16 1724 10/30/16 0633  Weight: 92.5 kg (204 lb)  97.2 kg (214 lb 4.6 oz)    Examination:  General exam: Appears calm and comfortable  Respiratory system: Clear to auscultation. Respiratory effort normal. Cardiovascular system: S1 & S2 heard, RRR. No JVD, murmurs, rubs, gallops or clicks. No pedal edema. Gastrointestinal system: Abdomen is nondistended, soft and nontender. No organomegaly or masses felt. Normal bowel sounds heard. Central nervous system: Alert and oriented. No focal neurological deficits. Extremities: Symmetric 5 x 5 power. Skin: No rashes, lesions or ulcers Psychiatry: Judgement and insight appear normal. Mood & affect appropriate.   Data Reviewed: I have personally  reviewed following labs and imaging studies  CBC:  Recent Labs Lab 10/28/16 1808 10/29/16 0519 10/30/16 0529  WBC 12.8* 11.0* 10.8*  NEUTROABS  --   --  6.6  HGB 13.7 13.9 13.4  HCT 40.5 39.8 40.4  MCV 91.4 89.8 93.3  PLT 224 215 [id number]   Basic Metabolic Panel:  Recent Labs Lab 10/28/16 1808 10/29/16 0519 10/30/16 0529  NA 137 141 140  K 3.8 3.8 3.7  CL 102 106 105  CO2 26 28 29   GLUCOSE 106* 134* 105*  BUN 27* 18 9  CREATININE 1.30* 0.86 0.75  CALCIUM 8.6* 8.6* 8.6*   GFR: Estimated Creatinine Clearance: 86 mL/min (by C-G formula based on SCr of 0.75 mg/dL). Liver Function Tests:  Recent Labs Lab 10/29/16 0519  AST 16  ALT 12*  ALKPHOS 55  BILITOT 0.6  PROT 6.5  ALBUMIN 3.4*   No results for input(s): LIPASE, AMYLASE in the last 168 hours. No results for input(s): AMMONIA in the last 168 hours. Coagulation Profile: No results for input(s): INR, PROTIME in the last 168 hours. Cardiac Enzymes:  Recent Labs Lab 10/29/16 0519  TROPONINI <0.03   BNP (last 3 results) No results for input(s): PROBNP in the last 8760 hours. HbA1C: No results for input(s): HGBA1C in the last 72 hours. CBG:  Recent Labs Lab 10/28/16 1740  GLUCAP 110*   Lipid Profile: No results for input(s): CHOL, HDL, LDLCALC, TRIG, CHOLHDL, LDLDIRECT in the last 72 hours. Thyroid Function Tests: No results for input(s): TSH, T4TOTAL, FREET4, T3FREE, THYROIDAB in the last 72 hours. Anemia Panel: No results for input(s): VITAMINB12, FOLATE, FERRITIN, TIBC, IRON, RETICCTPCT in the last 72 hours. Sepsis Labs: No results for input(s): PROCALCITON, LATICACIDVEN in the last 168 hours.  No results found for this or any previous visit (from the past 240 hour(s)).     Radiology Studies: No results found.    Scheduled Meds: . aspirin  650 mg Oral QPM  . buPROPion  150 mg Oral BID  . chlorhexidine  15 mL Mouth Rinse BID  . ezetimibe  10 mg Oral Daily  . heparin subcutaneous  5,000  Units Subcutaneous Q8H  . meclizine  12.5 mg Oral BID  . mouth rinse  15 mL Mouth Rinse q12n4p  . mirabegron ER  25 mg Oral Daily  . multivitamin with minerals  1 tablet Oral Daily  . omega-3 acid ethyl esters  1,000 mg Oral TID  . vitamin C  500 mg Oral Daily  . [START ON 11/02/2016] Vitamin D (Ergocalciferol)  50,000 Units Oral Q Mon   Continuous Infusions:   LOS: 0 days    Time spent: 30 minutes   Dessa Phi, DO [facility] [email or web address] Password [email or web address] 10/31/2016, 2:11 PM

## 2016-10-31 NOTE — Discharge Instructions (Signed)
Syncope °Syncope is when you temporarily lose consciousness. Syncope may also be called fainting or passing out. It is caused by a sudden decrease in blood flow to the brain. Even though most causes of syncope are not dangerous, syncope can be a sign of a serious medical problem. Signs that you may be about to faint include: °· Feeling dizzy or light-headed. °· Feeling nauseous. °· Seeing all white or all black in your field of vision. °· Having cold, clammy skin. °If you fainted, get medical help right away.Call your local emergency services (911 in the [place] Do not drive yourself to the hospital. °Follow these instructions at home: °Pay attention to any changes in your symptoms. Take these actions to help with your condition: °· Have someone stay with you until you feel stable. °· Do not drive, use machinery, or play sports until your health care provider says it is okay. °· Keep all follow-up visits as told by your health care provider. This is important. °· If you start to feel like you might faint, lie down right away and raise (elevate) your feet above the level of your heart. Breathe deeply and steadily. Wait until all of the symptoms have passed. °· Drink enough fluid to keep your urine clear or pale yellow. °· If you are taking blood pressure or heart medicine, get up slowly and take several minutes to sit and then stand. This can reduce dizziness. °· Take over-the-counter and prescription medicines only as told by your health care provider. °Get help right away if: °· You have a severe headache. °· You have unusual pain in your chest, abdomen, or back. °· You are bleeding from your mouth or rectum, or you have black or tarry stool. °· You have a very fast or irregular heartbeat (palpitations). °· You have pain with breathing. °· You faint once or repeatedly. °· You have a seizure. °· You are confused. °· You have trouble walking. °· You have severe weakness. °· You have vision problems. °These symptoms  may represent a serious problem that is an emergency. Do not wait to see if your symptoms will go away. Get medical help right away. Call your local emergency services (911 in the [place] Do not drive yourself to the hospital. °This information is not intended to replace advice given to you by your health care provider. Make sure you discuss any questions you have with your health care provider. °Document Released: 09/14/2005 Document Revised: 02/20/2016 Document Reviewed: 05/29/2015 °Elsevier Interactive Patient Education © 2017 [employer] ° °

## 2016-10-31 NOTE — Care Management Note (Signed)
Case Management Note  Patient Details  Name: Lance [name] MRN: [id number] Date of Birth: 10-28-1931  Subjective/Objective:  Syncope, HTN, OSA treated Bipap                  Action/Plan: Discharge Planning: AVS reviewed: NCM spoke to pt's wife at bedside. Pt has RW, cane and bedside commode at home. HH arranged with [facility]. Contacted [facility] Liaison to make aware of dc home today. Pt goes to [place]. Wife states she will [facility] about [facility] and ramp. States she plans to borrow one from a friend temporarily.   PCP Lance [name]   Expected Discharge Date:  10/31/16               Expected Discharge Plan:  [place]  In-House Referral:  NA  Discharge planning Services  CM Consult  Post Acute Care Choice:  Home Health Choice offered to:  Spouse  DME Arranged:  N/A DME Agency:  NA  HH Arranged:  PT, OT, RN [place] Agency:  Other - See comment  Status of Service:  Completed, signed off  If discussed at [place] of Stay Meetings, dates discussed:    Additional Comments:  Lance Rasher, RN 10/31/2016, 4:56 PM

## 2016-10-31 NOTE — Progress Notes (Signed)
Occupational Therapy Treatment and limited vestibular evaluation Patient Details Name: Lance [name] MRN: [id number] DOB: [birth year] Today's Date: 10/31/2016    History of present illness 83 y.o. male with history of stroke, hypertension, lumbar decompression was brought to the ER after patient had a syncopal episode while shopping last afternoon.  CT: Right Acute temporal bone longitudinal fracture extending to temp endplate.  MRI: negative for acute stroke   OT comments  Limited vestibular evaluation performed:  See below.  Pt is guarding neck and was unable to perform VOR himself with cues.  He is vague about dizziness but did say he had some spinning (but not today).  Educated wife that she should not assist him with moving/transfers due to high falls risk and injury to pt and herself.    Follow Up Recommendations  Supervision/Assistance - 24 hour;Home health OT (vs SNF, if wife cannot arrange 24/7 (she shouldn't transfer him by herself )    Equipment Recommendations  None recommended by OT    Recommendations for Other Services      Precautions / Restrictions Precautions Precautions: Fall Restrictions Weight Bearing Restrictions: No       Mobility Bed Mobility            General bed mobility comments: oob  Transfers Overall transfer level: Needs assistance Equipment used: Rolling walker (2 wheeled) Transfers: Sit to/from Stand Sit to Stand: Min assist         General transfer comment: assist to rise and stabilize. Cues to scoot forward to edge of chair    Balance Overall balance assessment: Needs assistance   Sitting balance-Leahy Scale: Good   Postural control: Right lateral lean when walking and posterior lean when standing:  Min A given Standing balance support: Bilateral upper extremity supported Standing balance-Leahy Scale: Poor                    ADL               Lower Body Bathing: Minimal assistance;Sit to/from stand  (simulated)       Lower Body Dressing: Moderate assistance;Sit to/from stand (simulated)   Toilet Transfer: Minimal assistance;Ambulation;RW (back to recliner)             General ADL Comments: pt/wife are familiar with reacher, but pt does not have one.   VESTIBULAR EVALUATION:  Performed limited vestibular evaluation:  Pt believes he had a spinning sensation prior to falling (happening with position changes). Of note, he had orthostatic hypotension today during sit to stand:  see vitals section of chart.  Tracking, saccades and gaze holding WFLs.  Pt is able to move his head, including in standing, but he guarded when OT tried twice.  Unable to test VOR/HT.  Pt did not get dizzy during limited vestibular testing.  Pt does report that [facility] is new and he has felt a "swishing" in R ear).  Educated to move slowly.  Pt had several LOB to R side with PT earlier.  He had lean to R when walking to bathroom with OT and required min A, but did not lose balance.  Also when taking BP, he had posterior lean but did not lose balance.  Did not test BPPV this session, pt doesn't seem to have symptoms consistent with this and he is able to look up and down without symptoms      Vision  Perception     Praxis      Cognition   Behavior During Therapy: WFL for tasks assessed/performed Overall Cognitive Status: Within Functional Limits for tasks assessed                       Extremity/Trunk Assessment  Upper Extremity Assessment Upper Extremity Assessment: Generalized weakness            Exercises     Shoulder Instructions       General Comments      Pertinent Vitals/ Pain       Pain Assessment: 0-10 Pain Score: 5  Pain Location: head:  posterior, maxillary sinuses and frontal Pain Descriptors / Indicators: Aching Pain Intervention(s): Limited activity within patient's tolerance;Monitored during session;Premedicated before session  Home Living  Family/patient expects to be discharged to:: Private residence Living Arrangements: Spouse/significant other Available Help at Discharge: Family;Friend(s);Available PRN/intermittently               Bathroom Shower/Tub: Walk-in Psychologist, prison and probation services: Standard     Home Equipment: Environmental consultant - 2 wheels;Cane - single point;Shower seat;Grab bars - tub/shower;Bedside commode   Additional Comments: BSC is over toilet.  Wife reports sons will not be there 24/7, but she will try to arrange other assistance.  Educated not to get pt up without additional assistance      Prior Functioning/Environment Level of Independence: Independent with assistive device(s)        Comments: used cane or rw   Frequency  Min 2X/week        Progress Toward Goals  OT Goals(current goals can now be found in the care plan section)  Progress towards OT goals: Progressing toward goals  Acute Rehab OT Goals Patient Stated Goal: go home OT Goal Formulation: With patient/family Time For Goal Achievement: 11/14/16 Potential to Achieve Goals: Good  Plan      Co-evaluation                 End of Session     Activity Tolerance Patient tolerated treatment well   Patient Left in chair;with call bell/phone within reach;with chair alarm set;with family/visitor present   Nurse Communication          Time: 1256-1330 OT Time Calculation (min): 34 min  Charges: OT General Charges $OT Visit: 1 Procedure OT Treatments $Self Care/Home Management : 8-22 mins $Therapeutic Activity: 8-22 mins  Zareena Willis 10/31/2016, 2:16 PM Lesle Chris, OTR/L [phone] 10/31/2016

## 2016-10-31 NOTE — Progress Notes (Signed)
Pt discharged to home with wife and sister. Reviewed instructions with wife, wife stated she would have additional support from family to care for patient. Pt left in stable condition. SRP, RN

## 2016-10-31 NOTE — Discharge Summary (Signed)
Physician Discharge Summary  Lance [name] [id number] DOB: 06/04/1932 DOA: 10/28/2016  PCP: Jerlyn Ly, MD  Admit date: 10/28/2016 Discharge date: 10/31/2016  Admitted From: Home Disposition:  Home  Recommendations for Outpatient Follow-up:  1. Follow up with PCP in 1 week 2. Stop amlodipine, doxepine, finasteride, flomax, valsartan on discharge. Monitor symptoms closely and follow up with PCP. If blood pressure remains uncontrolled, resume anti-hypertensive at PCP discretion.  3. Take meclizine/antivert twice daily for 1 week to see if this will help with vertigo symptoms.  4. If you develop significant CSF otorrhea, seek care immediately. Otherwise, temporal bone fracture on right should heal itself over time.   Home Health: PT/OT/RN Equipment/Devices: None   Discharge Condition: Stable CODE STATUS: Full  Diet recommendation: Heart healthy   Brief/Interim Summary: From H&P: Lance Johnstonis a 83 y.o.malewith history of stroke, hypertension was brought to the ER after patient had a syncopalepisode while shopping last afternoon. As per the patient and his wife patient has been feeling dizzy off and on for last 1 month. Patient gets dizzy particularly on standing. Yesterday patient had suddenly lost consciousness while shopping.The episode lasted for a few seconds. Patient did not have any incontinence of urine or tongue bite. Does not remember the incident. He did hit his head and had a wound. CT scan of the head did not show anything acute except for the right mastoid fracture. Patient had a similar episode in 2014 when patient had stroke. On-call neurologist Dr. Cheral Marker was consulted by the ER physician. Neurologist advised MRI brain.Patient on standing was found to get tachycardic heart rate increased from 80-110. Blood pressure remained stable. On arrival patient blood pressure was in the low 90s which improved with fluids. As per the family patient's blood pressure  at times was running in the lower ranges. EKG was showing normal sinus rhythm with nonspecific ST-T changes and QTC of 464milliseconds.Patient was admitted for further evaluation of his syncope.  Interim: During his hospitalization, medications that could contribute to orthostatic hypotension were held. He was evaluated by PT OT as well. MRI brain was negative for stroke, echocardiogram did not reveal any aortic valve stenosis to explain syncopal episode. On day of discharge, orthostatic vital signs were negative. He continued to be slightly dizzy but improved with Antivert.   Discharge Diagnoses:  Principal Problem:   Syncope Active Problems:   Hypertension   OSA treated with BiPAP   Dizziness   Syncope -Likely orthostatic etiology. Orthostatic was positive on admission and he takes norvasc, doxepin, proscar, flomax, diovan at home. Will hold these medications which can all affect Bp. Orthostatic VS negative today  -MRI brain negative for stroke  -Echo showed normal LV systolic function; grade 1 diastolic dysfunction; mild LVH; trace AI  Vertigo -Antivert BID trial for 1 week to see if this will help   Right temporal bone fracture -Dr. Ellene Route with neurosurgery reviewed the scans, supportive care at this time -No significant otorrhea during hospitalization  -Toradol prn for headache   Essential hypertension -Hold the antihypertensives at this time  History of stroke -Continue aspirin, zetia   Sleep apnea -BiPAP at nighttime    Discharge Instructions  Discharge Instructions    Call MD for:  persistant dizziness or light-headedness    Complete by:  As directed    Diet - low sodium heart healthy    Complete by:  As directed    Discharge instructions    Complete by:  As directed    Recommendations for Outpatient  Follow-up:  1. Follow up with PCP in 1 week 2. Stop amlodipine, doxepine, finasteride, flomax, valsartan on discharge. Monitor symptoms closely and follow  up with PCP. If blood pressure remains uncontrolled, resume anti-hypertensive at PCP discretion.  3. Take meclizine/antivert twice daily for 1 week to see if this will help with vertigo symptoms.   Increase activity slowly    Complete by:  As directed      Allergies as of 10/31/2016      Reactions   Penicillins Anaphylaxis, Hives, Other (See Comments)   Has patient had a PCN reaction causing immediate rash, facial/tongue/throat swelling, SOB or lightheadedness with hypotension: Yes Has patient had a PCN reaction causing severe rash involving mucus membranes or skin necrosis: No Has patient had a PCN reaction that required hospitalization Yes Has patient had a PCN reaction occurring within the last 10 years: No If all of the above answers are "NO", then may proceed with Cephalosporin use.   Simvastatin Other (See Comments)   Leg weakness      Medication List    STOP taking these medications   amLODipine 5 MG tablet Commonly known as:  NORVASC   doxepin 75 MG capsule Commonly known as:  SINEQUAN   finasteride 5 MG tablet Commonly known as:  PROSCAR   FLOMAX 0.4 MG Caps capsule Generic drug:  tamsulosin   valsartan 320 MG tablet Commonly known as:  DIOVAN     TAKE these medications   acetaminophen 500 MG tablet Commonly known as:  TYLENOL Take 500 mg by mouth every 6 (six) hours as needed (For pain.).   aspirin 325 MG tablet Take 650 mg by mouth every evening.   buPROPion 150 MG 12 hr tablet Commonly known as:  WELLBUTRIN SR Take 150 mg by mouth 2 (two) times daily.   diazepam 10 MG tablet Commonly known as:  VALIUM Take 10 mg by mouth every 8 (eight) hours as needed for anxiety.   ergocalciferol 50000 units capsule Commonly known as:  VITAMIN D2 Take 50,000 Units by mouth every Monday.   ezetimibe 10 MG tablet Commonly known as:  ZETIA Take 10 mg by mouth daily.   ketorolac 10 MG tablet Commonly known as:  TORADOL Take 1 tablet (10 mg total) by mouth every 6  (six) hours as needed (headache).   meclizine 12.5 MG tablet Commonly known as:  ANTIVERT Take 1 tablet (12.5 mg total) by mouth 2 (two) times daily.   multivitamin capsule Take 1 capsule by mouth daily.   MYRBETRIQ 25 MG Tb24 tablet Generic drug:  mirabegron ER Take 25 mg by mouth daily.   Omega 3 1000 MG Caps Take 1,000 mg by mouth 3 (three) times daily.   OVER THE COUNTER MEDICATION Take 1 tablet by mouth daily. Niacin Flush Free 300-500mg    Timolol Maleate PF 0.5 % Soln Place 1 drop into both eyes every morning.   traMADol 50 MG tablet Commonly known as:  ULTRAM Take 50-100 mg by mouth every 6 (six) hours as needed for severe pain.   vitamin C 500 MG tablet Commonly known as:  ASCORBIC ACID Take 500 mg by mouth daily.      Follow-up Information    PERINI,MARK A, MD. Schedule an appointment as soon as possible for a visit in 1 week(s).   Specialty:  Internal Medicine Contact information: [place] [address] [phone]          Allergies  Allergen Reactions  . Penicillins Anaphylaxis, Hives and  Other (See Comments)    Has patient had a PCN reaction causing immediate rash, facial/tongue/throat swelling, SOB or lightheadedness with hypotension: Yes Has patient had a PCN reaction causing severe rash involving mucus membranes or skin necrosis: No Has patient had a PCN reaction that required hospitalization Yes Has patient had a PCN reaction occurring within the last 10 years: No If all of the above answers are "NO", then may proceed with Cephalosporin use.   . Simvastatin Other (See Comments)    Leg weakness    Consultations:  Neurosurgery    Procedures/Studies: Dg Chest 2 View  Result Date: 10/28/2016 CLINICAL DATA:  Dizziness and weakness. Fell backwards. Chest pain. EXAM: CHEST  2 VIEW COMPARISON:  06/03/2013 FINDINGS: Heart size is within normal limits. Aortic atherosclerosis. Both lungs are clear. No evidence of pneumothorax or  pleural effusion. IMPRESSION: No active cardiopulmonary disease.  Aortic atherosclerosis. Electronically Signed   By: Earle Gell M.D.   On: 10/28/2016 18:33   Ct Head Wo Contrast  Result Date: 10/28/2016 CLINICAL DATA:  83 year old male with near syncope and fall. EXAM: CT HEAD WITHOUT CONTRAST CT CERVICAL SPINE WITHOUT CONTRAST TECHNIQUE: Multidetector CT imaging of the head and cervical spine was performed following the standard protocol without intravenous contrast. Multiplanar CT image reconstructions of the cervical spine were also generated. COMPARISON:  None. FINDINGS: CT HEAD FINDINGS Brain: No evidence of acute infarction, hemorrhage, hydrocephalus, extra-axial collection or mass lesion/mass effect. Mild atrophy and chronic small-vessel white matter ischemic changes are noted. Vascular: Intracranial atherosclerotic calcifications noted. Skull: No acute fracture or focal lesion. Sinuses/Orbits: A nondisplaced transverse fracture of the right mastoid is noted (best seen on the coronal cervical spine images) with a right mastoid effusion. Tiny amount of fluid in the left mastoids are noted. The paranasal sinuses are clear. Other: None CT CERVICAL SPINE FINDINGS Alignment: Normal. Skull base and vertebrae: No acute cervical spine fracture. No primary bone lesion or focal pathologic process. Soft tissues and spinal canal: No prevertebral fluid or swelling. No visible canal hematoma. Disc levels: Multilevel degenerative disc disease, spondylosis and facet arthropathy noted, mild to moderate at C3-4 and C4-5. Upper chest: Negative. Other: A nondisplaced transverse fracture of the right mastoid is noted (best seen on the coronal cervical spine images) with a right mastoid effusion. IMPRESSION: Nondisplaced transverse right mastoid fracture with right mastoid effusion (best seen on the coronal cervical spine images). Consider temporal bone CT for further characterization as clinically indicated. No evidence of  acute intracranial abnormality. Chronic small-vessel white matter ischemic changes. No static evidence of acute injury to the cervical spine. Degenerative changes as described. Electronically Signed   By: Margarette Canada M.D.   On: 10/28/2016 19:10   Ct Angio Chest Pe W And/or Wo Contrast  Result Date: 10/28/2016 CLINICAL DATA:  Near syncope. Episodic lightheadedness over the past several days. Fell today. EXAM: CT ANGIOGRAPHY CHEST WITH CONTRAST TECHNIQUE: Multidetector CT imaging of the chest was performed using the standard protocol during bolus administration of intravenous contrast. Multiplanar CT image reconstructions and MIPs were obtained to evaluate the vascular anatomy. CONTRAST:  100 mL Isovue 370 intravenous COMPARISON:  10/11/2015 FINDINGS: Cardiovascular: Satisfactory opacification of the pulmonary arteries to the segmental level. No evidence of pulmonary embolism. Normal heart size. No pericardial effusion. Thoracic aorta is normal in caliber with moderate atherosclerotic calcification. Extensive calcified coronary artery plaque. Mediastinum/Nodes: No enlarged mediastinal, hilar, or axillary lymph nodes. Thyroid gland, trachea, and esophagus demonstrate no significant findings. Lungs/Pleura: Linear scarring or atelectasis  in the bases, similar to the 10/11/2015 examination. No pleural effusions. Airways are patent. Upper Abdomen: No acute abnormality. Musculoskeletal: No significant skeletal lesion. Review of the MIP images confirms the above findings. IMPRESSION: Negative for acute pulmonary embolism. Moderate linear scarring or atelectasis in both lung bases. Electronically Signed   By: Andreas Newport M.D.   On: 10/28/2016 22:55   Ct Cervical Spine Wo Contrast  Result Date: 10/28/2016 CLINICAL DATA:  83 year old male with near syncope and fall. EXAM: CT HEAD WITHOUT CONTRAST CT CERVICAL SPINE WITHOUT CONTRAST TECHNIQUE: Multidetector CT imaging of the head and cervical spine was performed  following the standard protocol without intravenous contrast. Multiplanar CT image reconstructions of the cervical spine were also generated. COMPARISON:  None. FINDINGS: CT HEAD FINDINGS Brain: No evidence of acute infarction, hemorrhage, hydrocephalus, extra-axial collection or mass lesion/mass effect. Mild atrophy and chronic small-vessel white matter ischemic changes are noted. Vascular: Intracranial atherosclerotic calcifications noted. Skull: No acute fracture or focal lesion. Sinuses/Orbits: A nondisplaced transverse fracture of the right mastoid is noted (best seen on the coronal cervical spine images) with a right mastoid effusion. Tiny amount of fluid in the left mastoids are noted. The paranasal sinuses are clear. Other: None CT CERVICAL SPINE FINDINGS Alignment: Normal. Skull base and vertebrae: No acute cervical spine fracture. No primary bone lesion or focal pathologic process. Soft tissues and spinal canal: No prevertebral fluid or swelling. No visible canal hematoma. Disc levels: Multilevel degenerative disc disease, spondylosis and facet arthropathy noted, mild to moderate at C3-4 and C4-5. Upper chest: Negative. Other: A nondisplaced transverse fracture of the right mastoid is noted (best seen on the coronal cervical spine images) with a right mastoid effusion. IMPRESSION: Nondisplaced transverse right mastoid fracture with right mastoid effusion (best seen on the coronal cervical spine images). Consider temporal bone CT for further characterization as clinically indicated. No evidence of acute intracranial abnormality. Chronic small-vessel white matter ischemic changes. No static evidence of acute injury to the cervical spine. Degenerative changes as described. Electronically Signed   By: Margarette Canada M.D.   On: 10/28/2016 19:10   Mr Brain Wo Contrast  Result Date: 10/29/2016 CLINICAL DATA:  Dizziness.  Question cerebellar stroke. EXAM: MRI HEAD WITHOUT CONTRAST TECHNIQUE: Multiplanar, multiecho  pulse sequences of the brain and surrounding structures were obtained without intravenous contrast. COMPARISON:  CT head 10/28/2016 FINDINGS: Brain: Negative for acute infarct. Chronic microvascular ischemic change in the white matter. Generalized atrophy. Several foci of chronic microhemorrhage in the brain bilaterally. Negative for mass or edema. No shift of the midline structures. Pituitary not enlarged. Vascular: Normal arterial flow voids. Skull and upper cervical spine: Negative Sinuses/Orbits: Paranasal sinuses clear. Bilateral mastoid effusion. Bilateral lens replacement. No orbital mass lesion. Other: None IMPRESSION: Negative for acute infarct. Atrophy and chronic microvascular ischemia. Several foci of chronic microhemorrhage in the brain which are widely scattered and may be due to hypertension. Electronically Signed   By: Franchot Gallo M.D.   On: 10/29/2016 07:25   Ct Temporal Bones Wo Contrast  Result Date: 10/29/2016 CLINICAL DATA:  Follow-up mastoid fracture.  Near syncope and fall. EXAM: CT TEMPORAL BONES WITHOUT CONTRAST TECHNIQUE: Axial and coronal plane CT imaging of the petrous temporal bones was performed with thin-collimation image reconstruction. No intravenous contrast was administered. Multiplanar CT image reconstructions were also generated. COMPARISON:  CT HEAD and cervical spine October 28, 2016 FINDINGS: RIGHT: Small amount of blood products external auditory canal. Fracture through the tympanic plate and posterior wall of the  RIGHT external auditory canal. Small amount of blood products within the middle ear, including Prussak's space. Scutum remain sharp. Blood products and aditus ad antrum and mastoid air cells. Intact tegmen tympani. Intact otic capsule with normal appearance of the inner ear structures. No internal auditory canal expansion. No definite cerebellar pontine angle masses. Subcutaneous gas RIGHT skullbase. LEFT: External auditory canal is well formed, minimal  debris. Tympanic membrane is not thickened or retracted. The scutum is sharp. Well aerated middle ear including Prussak's space. Ossicles are well formed and located. Patent aditus ad antrum. Minimal LEFT mastoid effusion without coalescence. Intact tegmen tympani. Intact otic capsule with normal appearance of the inner ear structures. No internal auditory canal expansion. No definite cerebellar pontine angle masses. Minimal extra-axial pneumocephalus within the RIGHT transverse sinus. IMPRESSION: RIGHT: Acute temporal bone longitudinal fracture extending to temp endplate. No ossicular dislocation. Trace pneumocephalus. LEFT:  Small LEFT mastoid effusion. Electronically Signed   By: Elon Alas M.D.   On: 10/29/2016 06:22    Echo  Study Conclusions - Left ventricle: The cavity size was normal. Wall thickness was   increased in a pattern of mild LVH. Systolic function was normal.   The estimated ejection fraction was in the range of 60% to 65%.   Wall motion was normal; there were no regional wall motion   abnormalities. Doppler parameters are consistent with abnormal   left ventricular relaxation (grade 1 diastolic dysfunction). - Aortic valve: There was trivial regurgitation. - Mitral valve: Calcified annulus.  Impressions: - Normal LV systolic function; grade 1 diastolic dysfunction; mild   LVH; trace AI.   Discharge Exam: Vitals:   10/30/16 2200 10/31/16 0450  BP:  125/86  Pulse:  (!) 105  Resp:  18  Temp: 99 F (37.2 C) 98.6 F (37 C)   Vitals:   10/30/16 1430 10/30/16 2115 10/30/16 2200 10/31/16 0450  BP: (!) 138/96 (!) 163/84  125/86  Pulse: (!) 105 95  (!) 105  Resp: 18 18  18   Temp: 98.2 F (36.8 C) 98.3 F (36.8 C) 99 F (37.2 C) 98.6 F (37 C)  TempSrc: Oral Oral  Oral  SpO2: 94% 93%  95%  Weight:      Height:         General exam: Appears calm and comfortable  Respiratory system: Clear to auscultation. Respiratory effort normal. Cardiovascular  system: S1 & S2 heard, RRR. No JVD, murmurs, rubs, gallops or clicks. No pedal edema. Gastrointestinal system: Abdomen is nondistended, soft and nontender. No organomegaly or masses felt. Normal bowel sounds heard. Central nervous system: Alert and oriented. No focal neurological deficits. Extremities: Symmetric 5 x 5 power. Skin: No rashes, lesions or ulcers Psychiatry: Judgement and insight appear normal. Mood & affect appropriate.     The results of significant diagnostics from this hospitalization (including imaging, microbiology, ancillary and laboratory) are listed below for reference.     Microbiology: No results found for this or any previous visit (from the past 240 hour(s)).   Labs: BNP (last 3 results) No results for input(s): BNP in the last 8760 hours. Basic Metabolic Panel:  Recent Labs Lab 10/28/16 1808 10/29/16 0519 10/30/16 0529  NA 137 141 140  K 3.8 3.8 3.7  CL 102 106 105  CO2 26 28 29   GLUCOSE 106* 134* 105*  BUN 27* 18 9  CREATININE 1.30* 0.86 0.75  CALCIUM 8.6* 8.6* 8.6*   Liver Function Tests:  Recent Labs Lab 10/29/16 0519  AST 16  ALT  12*  ALKPHOS 55  BILITOT 0.6  PROT 6.5  ALBUMIN 3.4*   No results for input(s): LIPASE, AMYLASE in the last 168 hours. No results for input(s): AMMONIA in the last 168 hours. CBC:  Recent Labs Lab 10/28/16 1808 10/29/16 0519 10/30/16 0529  WBC 12.8* 11.0* 10.8*  NEUTROABS  --   --  6.6  HGB 13.7 13.9 13.4  HCT 40.5 39.8 40.4  MCV 91.4 89.8 93.3  PLT 224 215 226   Cardiac Enzymes:  Recent Labs Lab 10/29/16 0519  TROPONINI <0.03   BNP: Invalid input(s): POCBNP CBG:  Recent Labs Lab 10/28/16 1740  GLUCAP 110*   D-Dimer No results for input(s): DDIMER in the last 72 hours. Hgb A1c No results for input(s): HGBA1C in the last 72 hours. Lipid Profile No results for input(s): CHOL, HDL, LDLCALC, TRIG, CHOLHDL, LDLDIRECT in the last 72 hours. Thyroid function studies No results for  input(s): TSH, T4TOTAL, T3FREE, THYROIDAB in the last 72 hours.  Invalid input(s): FREET3 Anemia work up No results for input(s): VITAMINB12, FOLATE, FERRITIN, TIBC, IRON, RETICCTPCT in the last 72 hours. Urinalysis    Component Value Date/Time   COLORURINE YELLOW 10/28/2016 0038   APPEARANCEUR CLEAR 10/28/2016 0038   LABSPEC 1.019 10/28/2016 0038   PHURINE 6.0 10/28/2016 0038   GLUCOSEU NEGATIVE 10/28/2016 0038   HGBUR NEGATIVE 10/28/2016 0038   BILIRUBINUR NEGATIVE 10/28/2016 0038   KETONESUR NEGATIVE 10/28/2016 0038   PROTEINUR NEGATIVE 10/28/2016 0038   UROBILINOGEN 0.2 06/03/2013 1033   NITRITE NEGATIVE 10/28/2016 0038   LEUKOCYTESUR NEGATIVE 10/28/2016 0038   Sepsis Labs Invalid input(s): PROCALCITONIN,  WBC,  LACTICIDVEN Microbiology No results found for this or any previous visit (from the past 240 hour(s)).   Time coordinating discharge: Over 30 minutes  SIGNED:  Dessa Phi, DO [facility] Pager [phone]  If 7PM-7AM, please contact night-coverage [email or web address] Password [email or web address] 10/31/2016, 3:19 PM

## 2016-10-31 NOTE — Progress Notes (Signed)
Nutrition Brief Note  RD paged via call center as pt requesting handouts/information on a heart healthy/low sodium diet. RD to follow up.  Corrin Parker, MS, RD, LDN Pager # [phone] After hours/ weekend pager # [phone]

## 2016-11-25 ENCOUNTER — Other Ambulatory Visit (HOSPITAL_COMMUNITY): Payer: Self-pay | Admitting: *Deleted

## 2016-11-26 ENCOUNTER — Ambulatory Visit (HOSPITAL_COMMUNITY)
Admission: RE | Admit: 2016-11-26 | Discharge: 2016-11-26 | Disposition: A | Discharge status: Home / Self Care | Payer: Medicare Other | Source: Ambulatory Visit | Attending: Internal Medicine | Admitting: Internal Medicine

## 2016-11-26 DIAGNOSIS — E278 Other specified disorders of adrenal gland: Secondary | ICD-10-CM | POA: Insufficient documentation

## 2016-11-26 LAB — ACTH STIMULATION, 3 TIME POINTS
Cortisol, 30 Min: 24 ug/dL
Cortisol, 60 Min: 21.9 ug/dL
Cortisol, Base: 11.7 ug/dL

## 2016-11-26 MED ORDER — COSYNTROPIN 0.25 MG IJ SOLR
0.2500 mg | Freq: Once | INTRAMUSCULAR | Status: AC
  Administered 2016-11-26: 0.25 mg via INTRAVENOUS
  Filled 2016-11-26: qty 0.25

## 2016-11-26 NOTE — Discharge Instructions (Signed)
ACTH Stimulation Test Why am I having this test? The adrenocorticotropic hormone (ACTH) stimulation test indirectly shows how well your adrenal glands are working. ACTH is a hormone that is produced by a gland in your brain called the pituitary gland. ACTH stimulates your two adrenal glands, which are located above each kidney. The adrenal glands produce hormones that are released into the blood. One of these hormones is cortisol. Cortisol helps your body to respond to stress. If your adrenal glands are not responding to [place] properly, you may have too much or too little cortisol. What kind of sample is taken? Two or more blood samples are required for this test. Blood samples are usually collected by inserting a needle into a vein. Cortisol will be measured in the first blood sample to provide a baseline level. After the first blood sample has been collected, you will be given cosyntropin. Cosyntropin is similar to ACTH and should cause the adrenal glands to release cortisol. Cosyntropin is usually given through an IV tube. It could also be given as an intramuscular (IM) injection. At specified intervals after receiving the cosyntropin, you will have one or more blood samples taken to measure your cortisol levels. The test results will be compared to show the amount of cortisol in your blood before and after you were given cosyntropin. How do I prepare for this test? Do not eat or drink anything after midnight on the night before the test or as directed by your health care provider. What are the reference values? Reference values are considered healthy values established after testing a large group of healthy people. Reference values may vary among different people, labs, and hospitals. It is your responsibility to obtain your test results. Ask the lab or department performing the test when and how you will get your results. The following are reference values for the various ACTH stimulation  tests:  Rapid test: cortisol levels increase greater than 7 mg/dL above baseline.  24-hour test: cortisol levels greater than 40 mcg/dL.  3-day test: cortisol levels greater than 40 mcg/dL. What do the results mean? Results outside of the reference value may indicate:  Cushing syndrome.  Adrenal insufficiency. Talk with your health care provider to discuss your results, treatment options, and if necessary, the need for more tests. Talk with your health care provider if you have any questions about your results. Talk with your health care provider to discuss your results, treatment options, and if necessary, the need for more tests. Talk with your health care provider if you have any questions about your results. This information is not intended to replace advice given to you by your health care provider. Make sure you discuss any questions you have with your health care provider. Document Released: 10/17/2010 Document Revised: 05/18/2016 Document Reviewed: 04/25/2014 Elsevier Interactive Patient Education  2017 [employer].

## 2016-12-29 ENCOUNTER — Ambulatory Visit: Payer: Medicare Other | Admitting: Adult Health

## 2017-02-15 ENCOUNTER — Ambulatory Visit (INDEPENDENT_AMBULATORY_CARE_PROVIDER_SITE_OTHER): Payer: Medicare Other | Admitting: Physician Assistant

## 2017-02-15 ENCOUNTER — Ambulatory Visit (INDEPENDENT_AMBULATORY_CARE_PROVIDER_SITE_OTHER): Payer: Medicare Other

## 2017-02-15 DIAGNOSIS — M1711 Unilateral primary osteoarthritis, right knee: Secondary | ICD-10-CM

## 2017-02-15 DIAGNOSIS — Z96652 Presence of left artificial knee joint: Secondary | ICD-10-CM

## 2017-02-15 MED ORDER — METHYLPREDNISOLONE ACETATE 40 MG/ML IJ SUSP
40.0000 mg | INTRAMUSCULAR | Status: AC | PRN
  Administered 2017-02-15: 40 mg via INTRAMUSCULAR

## 2017-02-15 MED ORDER — LIDOCAINE HCL 1 % IJ SOLN
0.5000 mL | INTRAMUSCULAR | Status: AC | PRN
  Administered 2017-02-15: .5 mL

## 2017-02-15 NOTE — Progress Notes (Signed)
Office Visit Note   Patient: Lance [name]           Date of Birth: 1932/09/02           MRN: [id number] Visit Date: 02/15/2017              Requested by: Crist Infante, MD [address] [place], [place] [address] PCP: Crist Infante, MD   Assessment & Plan: Visit Diagnoses:  1. Primary osteoarthritis of right knee   2. S/P left unicompartmental knee replacement     Plan: Follow-up in 2 weeks that time may consider cortisone injection in the right knee. Continue to work on Field seismologist.  Follow-Up Instructions: Return in about 2 weeks (around 03/01/2017).   Orders:  Orders Placed This Encounter  Procedures  . Trigger Point Injection  . XR Knee 1-2 Views Right   No orders of the defined types were placed in this encounter.     Procedures: Trigger Point Inj pes Anserinus  Date/Time: 02/15/2017 1:56 PM Performed by: Pete Pelt Authorized by: Pete Pelt   Consent Given by:  Patient Indications:  Pain Total # of Trigger Points:  1 Medications #1:  0.5 mL lidocaine 1 %; 40 mg methylPREDNISolone acetate 40 MG/ML     Clinical Data: No additional findings.   Subjective: Bilateral knee pain  HPI Mr. Mcparland is now 1 year status post left knee unicompartmental arthroplasty. States overall the left knee is coming along. He has pain in the anterior aspect of the knee. Is also having right knee pain. He is being treated by Dr. Joylene Draft for orthostatic hypotension. He actually reports that he had a fall and fractured skull proximally 3 months ago. He is working with a Physiological scientist to work on [employer] loss and strengthening of the lower legs. Review of Systems No known injury to either knee. Otherwise please see history of present illness  Objective: Vital Signs: There were no vitals taken for this visit.  Physical Exam  Constitutional: He is oriented to person, place, and time. He appears well-developed and well-nourished. No distress.    Pulmonary/Chest: Effort normal.  Neurological: He is alert and oriented to person, place, and time.  Psychiatric: He has a normal mood and affect. His behavior is normal.    Ortho Exam Left knee no effusion abnormal warmth erythema has overall good range of motion motion the knee no instability valgus varus stressing. Tenderness over the left pes anserinus. Right knee good range of motion no instability valgus varus stressing tenderness along medial joint line. No effusion abnormal warmth erythema Specialty Comments:  No specialty comments available.  Imaging: Xr Knee 1-2 Views Right  Result Date: 02/15/2017 AP view bilateral knees a lateral view of the right knee: No acute fractures. Status post left unicompartmental arthroplasty with well-seated components no complicating features. Right knee medial compartmental severe arthritis. Lateral compartment well preserved. Patellofemoral compartment with mild change changes.    PMFS History: Patient Active Problem List   Diagnosis Date Noted  . Primary osteoarthritis of right knee 02/15/2017  . Dizziness 10/28/2016  . 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 Osteoarthritis of left knee 02/25/2016  . S/P left unicompartmental knee replacement 02/25/2016  . Spinal stenosis at L4-L5 level 11/11/2015  . Aspiration pneumonia of left lower lobe due to gastric secretions ([place]) 10/22/2015  . Bilateral leg weakness 10/22/2015  . 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  . OSA treated with BiPAP 10/22/2015  . Ambulatory dysfunction 10/22/2015  .  CVA (cerebral infarction) 06/03/2013  . Syncope 06/03/2013  . Facial laceration 06/03/2013  . Acute bronchitis 06/03/2013  . Hypertension    Past Medical History:  Diagnosis Date  . Alcohol abuse    Remote hx, stopped in 1970  . Anxiety   . BPH (benign prostatic hypertrophy) with urinary obstruction   . Colon polyps   . Depression   . Diverticulosis   . Glaucoma   .  Headache    sinus headaches  . Hypercholesterolemia   . Hypertension   . Hypogonadism male   . OSA on CPAP   . Osteoarthritis   . Osteopenia   . Sleep apnea   . Vitamin D deficiency     Family History  Problem Relation Age of Onset  . Stroke Neg Hx     Past Surgical History:  Procedure Laterality Date  . APPENDECTOMY  1945  . BACK SURGERY    . EYE SURGERY     bilateral cataracts  . LUMBAR LAMINECTOMY/DECOMPRESSION MICRODISCECTOMY Bilateral 11/11/2015   Procedure: Bilateral L4-5 Laminectomy;  Surgeon: Kary Kos, MD;  Location: [facility];  Service: Neurosurgery;  Laterality: Bilateral;  Bilateral L4-5 Laminectomy  . PARTIAL KNEE ARTHROPLASTY Left 02/25/2016   Procedure: LEFT UNICOMPARTMENTAL KNEE ARTHROPLASTY;  Surgeon: Mcarthur Rossetti, MD;  Location: [facility];  Service: Orthopedics;  Laterality: Left;  . REPLACEMENT UNICONDYLAR JOINT KNEE Left 02/25/2016  . SHOULDER SURGERY Right   . TONSILLECTOMY     Social History   Occupational History  . retired Retired   Social History Main Topics  . Smoking status: Former Smoker    Quit date: 09/28/1962  . Smokeless tobacco: Never Used  . Alcohol use No  . Drug use: No  . Sexual activity: Not on file

## 2017-03-01 ENCOUNTER — Encounter (INDEPENDENT_AMBULATORY_CARE_PROVIDER_SITE_OTHER): Payer: Self-pay | Admitting: Physician Assistant

## 2017-03-01 ENCOUNTER — Ambulatory Visit (INDEPENDENT_AMBULATORY_CARE_PROVIDER_SITE_OTHER): Payer: Medicare Other | Admitting: Physician Assistant

## 2017-03-01 DIAGNOSIS — M7052 Other bursitis of knee, left knee: Secondary | ICD-10-CM

## 2017-03-01 DIAGNOSIS — M1711 Unilateral primary osteoarthritis, right knee: Secondary | ICD-10-CM

## 2017-03-01 NOTE — Progress Notes (Signed)
Mr. Laban returns today follow-up of his left knee pes anserinus injection. States is most comfortable he is been a year since having the uni-knee arthroplasty. He also feels that the "front moving through" helped.  Physical exam: Bilateral knees good range of motion without pain. Left knee nontender over the pes anserinus area. No abnormal warmth erythema of either knee. Tight hamstrings bilaterally  Plan: Discussed with him some hamstring stretching. Also gave him the handout with some stretching exercises. We'll see him back on an as-needed basis pain persist or worse.

## 2017-03-11 ENCOUNTER — Ambulatory Visit: Payer: Medicare Other | Admitting: Adult Health

## 2017-03-16 ENCOUNTER — Ambulatory Visit: Payer: Medicare Other | Admitting: Adult Health

## 2017-04-20 ENCOUNTER — Encounter: Payer: Self-pay | Admitting: Adult Health

## 2017-04-20 ENCOUNTER — Ambulatory Visit (INDEPENDENT_AMBULATORY_CARE_PROVIDER_SITE_OTHER): Payer: Medicare Other | Admitting: Adult Health

## 2017-04-20 VITALS — BP 135/93 | HR 92 | Wt 207.0 lb

## 2017-04-20 DIAGNOSIS — G4733 Obstructive sleep apnea (adult) (pediatric): Secondary | ICD-10-CM | POA: Diagnosis not present

## 2017-04-20 DIAGNOSIS — R413 Other amnesia: Secondary | ICD-10-CM

## 2017-04-20 NOTE — Progress Notes (Signed)
PATIENT: Lance [name] DOB: 22-Aug-1932  REASON FOR VISIT: follow up- OSA on CPAP HISTORY FROM: patient  HISTORY OF PRESENT ILLNESS: Today 04/20/17  Lance [name] is an 83 year old male with a history of obstructive sleep apnea on BiPAP and mild memory trouble. He returns today for follow-up. Lance patient states that he is using his old machine. He did not bring her memory card with him. He is unsure if his CPAP even has a memory card. He states that he is using Lance machine nightly. He does note dry mouth. He states that he has his humidity turned up unsure if it's that Lance max. Lance patient feels that his memory has remained Lance same. He is able to complete all ADLs independently. He does not operate a motor vehicle. States that he is sleeping well. Reports that he does have orthostatic hypotension. He has to stand very slowly in order not to get dizzy. He returns today for an evaluation.  HISTORY history from 04/27/2016. As Lance pleasure of meeting Lance [name] today again Lance [name] underwent a Pap titration on 01/23/2016 which ended up to require BiPAP. His baseline AHI was 45.6 per hour of sleep with 182 minutes of oxygen desaturation. Sleep efficiency was 81.6%. BiPAP at 23/18 was Lance most effective but difficult to tolerate Lance AHI was 0.0 with an oxygen nadir of 91. In Lance meantime we have reduced Lance pressure based on Lance patient's report 217/13 and his residual AHI is 3.9, certainly not a bad result. However he has a lot of ear leaks that wake him and his wife at night. I exchanged his interface today from a mirage quattro to a [employer]. He only sleeps 4 hours now ! I will bring Lance patient over to Lance sleep lab for that we can train him how to put Lance mask on when in bed. This may be accounted for gravity changes. Lance patient also underwent today a [place] cognitive assessment and he scored 23 out of 30 points. He was able to recall 4 out of 5 words. He did have some  problems today with Lance serial 7 subtraction he was able to draw a cube, a clock face and name all 3 animals.  REVIEW OF SYSTEMS: Out of a complete 14 system review of symptoms, Lance patient complains only of Lance following symptoms, and all other reviewed systems are negative.  Joint pain, walking difficulty, hearing loss  ALLERGIES: Allergies  Allergen Reactions  . Penicillins Anaphylaxis, Hives and Other (See Comments)    Has patient had a PCN reaction causing immediate rash, facial/tongue/throat swelling, SOB or lightheadedness with hypotension: Yes Has patient had a PCN reaction causing severe rash involving mucus membranes or skin necrosis: No Has patient had a PCN reaction that required hospitalization Yes Has patient had a PCN reaction occurring within Lance last 10 years: No If all of Lance above answers are "NO", then may proceed with Cephalosporin use.   . Simvastatin Other (See Comments)    Leg weakness    HOME MEDICATIONS: Outpatient Medications Prior to Visit  Medication Sig Dispense Refill  . acetaminophen (TYLENOL) 500 MG tablet Take 500 mg by mouth every 6 (six) hours as needed (For pain.).    Marland Kitchen aspirin 325 MG tablet Take 650 mg by mouth every evening.     Marland Kitchen buPROPion (WELLBUTRIN SR) 150 MG 12 hr tablet Take 150 mg by mouth 2 (two) times daily.    . diazepam (VALIUM) 10 MG tablet Take 10 mg  by mouth every 8 (eight) hours as needed for anxiety.     Marland Kitchen doxepin (SINEQUAN) 75 MG capsule Take 75 mg by mouth.    . ergocalciferol (VITAMIN D2) 50000 UNITS capsule Take 50,000 Units by mouth every Monday.     . ezetimibe (ZETIA) 10 MG tablet Take 10 mg by mouth daily.  11  . meclizine (ANTIVERT) 12.5 MG tablet Take 1 tablet (12.5 mg total) by mouth 2 (two) times daily. 30 tablet 0  . mirabegron ER (MYRBETRIQ) 25 MG TB24 tablet Take 25 mg by mouth daily.    . Multiple Vitamin (MULTIVITAMIN) capsule Take 1 capsule by mouth daily.     . OMEGA 3 1000 MG CAPS Take 1,000 mg by mouth 3  (three) times daily.     . Timolol Maleate PF 0.5 % SOLN Place 1 drop into both eyes every morning.    . traMADol (ULTRAM) 50 MG tablet Take 50-100 mg by mouth every 6 (six) hours as needed for severe pain.     . vitamin C (ASCORBIC ACID) 500 MG tablet Take 500 mg by mouth daily.    Marland Kitchen OVER Lance COUNTER MEDICATION Take 1 tablet by mouth daily. Niacin Flush Free 300-500mg     . amLODipine (NORVASC) 5 MG tablet TAKE 1 TABLET BY MOUTH EVERY DAY    . furosemide (LASIX) 20 MG tablet TAKE 1 TABLET BY MOUTH EVERY MORNING AS DIRECTED FOR EDEMA LEGS  3  . ketorolac (TORADOL) 10 MG tablet Take 1 tablet (10 mg total) by mouth every 6 (six) hours as needed (headache). (Patient not taking: Reported on 03/01/2017) 20 tablet 0   No facility-administered medications prior to visit.     PAST MEDICAL HISTORY: Past Medical History:  Diagnosis Date  . Alcohol abuse    Remote hx, stopped in 1970  . Anxiety   . BPH (benign prostatic hypertrophy) with urinary obstruction   . Colon polyps   . Depression   . Diverticulosis   . Glaucoma   . Headache    sinus headaches  . Hypercholesterolemia   . Hypertension   . Hypogonadism male   . OSA on CPAP   . Osteoarthritis   . Osteopenia   . Sleep apnea   . Vitamin D deficiency     PAST SURGICAL HISTORY: Past Surgical History:  Procedure Laterality Date  . APPENDECTOMY  1945  . BACK SURGERY    . EYE SURGERY     bilateral cataracts  . LUMBAR LAMINECTOMY/DECOMPRESSION MICRODISCECTOMY Bilateral 11/11/2015   Procedure: Bilateral L4-5 Laminectomy;  Surgeon: Kary Kos, MD;  Location: [facility];  Service: Neurosurgery;  Laterality: Bilateral;  Bilateral L4-5 Laminectomy  . PARTIAL KNEE ARTHROPLASTY Left 02/25/2016   Procedure: LEFT UNICOMPARTMENTAL KNEE ARTHROPLASTY;  Surgeon: Mcarthur Rossetti, MD;  Location: [facility];  Service: Orthopedics;  Laterality: Left;  . REPLACEMENT UNICONDYLAR JOINT KNEE Left 02/25/2016  . SHOULDER SURGERY Right   . TONSILLECTOMY       FAMILY HISTORY: Family History  Problem Relation Age of Onset  . Stroke Neg Hx     SOCIAL HISTORY: Social History   Social History  . Marital status: Married    Spouse name: Jan  . Number of children: 3  . Years of education: N/A   Occupational History  . retired Retired   Social History Main Topics  . Smoking status: Former Smoker    Quit date: 09/28/1962  . Smokeless tobacco: Never Used  . Alcohol use No  . Drug use:  No  . Sexual activity: Not on file   Other Topics Concern  . Not on file   Social History Narrative   Lives with wife      PHYSICAL EXAM  Vitals:   04/20/17 1251  BP: (!) 135/93  Pulse: 92  Weight: 207 lb (93.9 kg)   Body mass index is 27.31 kg/m.   MMSE - Mini Mental State Exam 04/20/2017 07/06/2016  Orientation to time 5 4  Orientation to Place 4 4  Registration 3 3  Attention/ Calculation 3 3  Recall 2 2  Language- name 2 objects 2 2  Language- repeat 0 1  Language- follow 3 step command 3 3  Language- read & follow direction 1 1  Write a sentence 1 1  Copy design 0 0  Total score 24 24     Generalized: Well developed, in no acute distress   Neurological examination  Mentation: Alert oriented to time, place, history taking. Follows all commands speech and language fluent Cranial nerve II-XII: Pupils were equal round reactive to light. Extraocular movements were full, visual field were full on confrontational test. Facial sensation and strength were normal. Uvula tongue midline. Head turning and shoulder shrug  were normal and symmetric. Motor: Lance motor testing reveals 5 over 5 strength of all 4 extremities. Good symmetric motor tone is noted throughout.  Sensory: Sensory testing is intact to soft touch on all 4 extremities. No evidence of extinction is noted.  Coordination: Cerebellar testing reveals good finger-nose-finger and heel-to-shin bilaterally.  Gait and station: Gait is unsteady. Tandem gait not attempted. Reflexes:  Deep tendon reflexes are symmetric and normal bilaterally.   DIAGNOSTIC DATA (LABS, IMAGING, TESTING) - I reviewed patient records, labs, notes, testing and imaging myself where available.  Lab Results  Component Value Date   WBC 10.8 (H) 10/30/2016   HGB 13.4 10/30/2016   HCT 40.4 10/30/2016   MCV 93.3 10/30/2016   PLT 226 10/30/2016      Component Value Date/Time   NA 140 10/30/2016 0529   K 3.7 10/30/2016 0529   CL 105 10/30/2016 0529   CO2 29 10/30/2016 0529   GLUCOSE 105 (H) 10/30/2016 0529   BUN 9 10/30/2016 0529   CREATININE 0.75 10/30/2016 0529   CALCIUM 8.6 (L) 10/30/2016 0529   PROT 6.5 10/29/2016 0519   ALBUMIN 3.4 (L) 10/29/2016 0519   AST 16 10/29/2016 0519   ALT 12 (L) 10/29/2016 0519   ALKPHOS 55 10/29/2016 0519   BILITOT 0.6 10/29/2016 0519   GFRNONAA >60 10/30/2016 0529   GFRAA >60 10/30/2016 0529          ASSESSMENT AND PLAN 83 y.o. year old male  has a past medical history of Alcohol abuse; Anxiety; BPH (benign prostatic hypertrophy) with urinary obstruction; Colon polyps; Depression; Diverticulosis; Glaucoma; Headache; Hypercholesterolemia; Hypertension; Hypogonadism male; OSA on CPAP; Osteoarthritis; Osteopenia; Sleep apnea; and Vitamin D deficiency. here with:  1. Obstructive sleep apnea on CPAP 2. Memory disturbance  Lance patient did not bring his memory card and is currently using his old machine. I was unable to get a download. Lance patient is advised to look at his machine and if he has a memory card and he should bring it in. Lance patient's memory score has remained stable. He is currently not on any memory medication. We will continue to monitor. If his symptoms worsen or he develops new symptoms he should let us know. He will follow-up in 6 months or sooner if needed.  Ward Givens, MSN, NP-C 04/20/2017, 1:11 PM [facility] [address], [place], [place] [address] [phone]

## 2017-04-20 NOTE — Patient Instructions (Signed)
Your Plan:  Continue using bipap nightly- look to see if you have a memory card. If not let us know Memory score is stable we will continue to monitor Biotene mouthwash  Thank you for coming to see [place] at [facility]. I hope we have been able to provide you high quality care today.  You may receive a patient satisfaction survey over the next few weeks. We would appreciate your feedback and comments so that we may continue to improve ourselves and the health of our patients.

## 2017-04-21 NOTE — Progress Notes (Signed)
I agree with the assessment and plan as directed by NP .The patient is known to me .   Glenard Keesling, MD  

## 2017-04-26 ENCOUNTER — Encounter: Payer: Self-pay | Admitting: Adult Health

## 2017-05-11 ENCOUNTER — Telehealth: Payer: Self-pay | Admitting: Adult Health

## 2017-05-11 ENCOUNTER — Telehealth: Payer: Self-pay | Admitting: *Deleted

## 2017-05-11 DIAGNOSIS — Z9989 Dependence on other enabling machines and devices: Principal | ICD-10-CM

## 2017-05-11 DIAGNOSIS — G4733 Obstructive sleep apnea (adult) (pediatric): Secondary | ICD-10-CM

## 2017-05-11 NOTE — Telephone Encounter (Signed)
Mr. Mcdaniel brought in his CPAP download. He uses machine29 out of 30 days for compliance of 97%. He uses machine greater than 4 hours 23 out of 30 days for compliance of 77%. On average he uses his machine 6 hours and 16 minutes. His residual AHI is 21.4 on a pressure of 12  cm water. He leak in the 95% is 5.    Dr. Brett Fairy recommended that we increase Pressure. I will increase to 13 cm H20. Order sent to his DME company.

## 2017-05-11 NOTE — Telephone Encounter (Signed)
-----   Message from Patsy [place] sent at 05/11/2017  4:06 PM EDT ----- Regarding: RE: bipap Thank you Katharine Look,  This referral has been pulled and sent into office for processing.   Angie  ----- Message ----- From: Brandon Melnick, RN Sent: 05/11/2017  10:11 AM To: Patsy [place] Subject: bipap                                          Hi angela  Change in Bipap pressure on this pt.  Thanks  [employer]

## 2017-05-11 NOTE — Telephone Encounter (Signed)
Spoke to wife who would relay the message to pt that received download.  Pressure to be changed to 13cm H20.  She would relay to pt.  [facility] made aware of change.  Pt to call back if questions.

## 2017-05-17 ENCOUNTER — Telehealth: Payer: Self-pay | Admitting: Neurology

## 2017-05-18 NOTE — Telephone Encounter (Signed)
Spoke with patients wife and let her know that [facility] is where patient needs to go and have his cpap pressure changed. The order has been sent.

## 2017-09-01 ENCOUNTER — Telehealth: Payer: Self-pay

## 2017-09-01 DIAGNOSIS — G4733 Obstructive sleep apnea (adult) (pediatric): Secondary | ICD-10-CM

## 2017-09-01 NOTE — Telephone Encounter (Signed)
Patient's wife is calling to discuss needing an updated CPAP machine for her husband. Please call at your convenience.

## 2017-09-02 NOTE — Telephone Encounter (Signed)
Called the patient back and there was no answer. LVM

## 2017-09-08 NOTE — Telephone Encounter (Signed)
Called and spoke with the wife and she informed me that [place] around thanksgiving without his Bipap machine because accidentally left it home. She states that once they got home the machine wasn't working appropriately. They took it to [facility] and they were able to get the machine back up and running so the patient is able to use this. While they were there [facility] informed them that he is eligible for an upgrade and the patient is asking that orders are sent to help set the patient up with a new machine. I informed the patient that Dr Brett Fairy is out of the office until Monday and that once she returns I will discuss this and see if she is ok with ordering the a pt a new one. Patients wife verbalized understanding.

## 2017-09-08 NOTE — Telephone Encounter (Signed)
Patient's wife returned your call and requested a call back. Please call and advise.

## 2017-09-13 NOTE — Telephone Encounter (Signed)
PER Dr. Brett Fairy, she is ok with the patient being ordered a new auto titration capable machine at current settings. Will send order to [facility] today.

## 2017-09-13 NOTE — Addendum Note (Signed)
Addended by: Darleen Crocker on: 09/13/2017 09:09 AM   Modules accepted: Orders

## 2017-09-30 NOTE — Telephone Encounter (Signed)
[facility] needed a copy of office notes and baseline sleep study that was completed. I have sent these reports over via fax today.

## 2017-10-21 ENCOUNTER — Ambulatory Visit: Payer: Medicare Other | Admitting: Neurology

## 2017-10-28 ENCOUNTER — Encounter (HOSPITAL_COMMUNITY): Payer: Self-pay | Admitting: Emergency Medicine

## 2017-10-28 ENCOUNTER — Emergency Department (HOSPITAL_COMMUNITY): Payer: Medicare Other

## 2017-10-28 ENCOUNTER — Other Ambulatory Visit: Payer: Self-pay

## 2017-10-28 ENCOUNTER — Emergency Department (HOSPITAL_COMMUNITY)
Admission: EM | Admit: 2017-10-28 | Discharge: 2017-10-28 | Disposition: A | Discharge status: Home / Self Care | Payer: Medicare Other | Attending: Emergency Medicine | Admitting: Emergency Medicine

## 2017-10-28 DIAGNOSIS — Z96652 Presence of left artificial knee joint: Secondary | ICD-10-CM | POA: Insufficient documentation

## 2017-10-28 DIAGNOSIS — Z7982 Long term (current) use of aspirin: Secondary | ICD-10-CM | POA: Insufficient documentation

## 2017-10-28 DIAGNOSIS — Z79899 Other long term (current) drug therapy: Secondary | ICD-10-CM | POA: Diagnosis not present

## 2017-10-28 DIAGNOSIS — Z8673 Personal history of transient ischemic attack (TIA), and cerebral infarction without residual deficits: Secondary | ICD-10-CM | POA: Insufficient documentation

## 2017-10-28 DIAGNOSIS — R0602 Shortness of breath: Secondary | ICD-10-CM | POA: Diagnosis present

## 2017-10-28 DIAGNOSIS — J209 Acute bronchitis, unspecified: Secondary | ICD-10-CM

## 2017-10-28 DIAGNOSIS — Z87891 Personal history of nicotine dependence: Secondary | ICD-10-CM | POA: Diagnosis not present

## 2017-10-28 DIAGNOSIS — I10 Essential (primary) hypertension: Secondary | ICD-10-CM | POA: Insufficient documentation

## 2017-10-28 LAB — COMPREHENSIVE METABOLIC PANEL
ALBUMIN: 3.5 g/dL (ref 3.5–5.0)
ALK PHOS: 58 U/L (ref 38–126)
ALT: 15 U/L — AB (ref 17–63)
ANION GAP: 11 (ref 5–15)
AST: 24 U/L (ref 15–41)
BUN: 21 mg/dL — ABNORMAL HIGH (ref 6–20)
CHLORIDE: 102 mmol/L (ref 101–111)
CO2: 27 mmol/L (ref 22–32)
CREATININE: 1.26 mg/dL — AB (ref 0.61–1.24)
Calcium: 8.6 mg/dL — ABNORMAL LOW (ref 8.9–10.3)
GFR calc non Af Amer: 51 mL/min — ABNORMAL LOW (ref >60)
GFR, EST AFRICAN AMERICAN: 59 mL/min — AB (ref >60)
GLUCOSE: 107 mg/dL — AB (ref 65–99)
Potassium: 3.6 mmol/L (ref 3.5–5.1)
SODIUM: 140 mmol/L (ref 135–145)
Total Bilirubin: 0.9 mg/dL (ref 0.3–1.2)
Total Protein: 6.6 g/dL (ref 6.5–8.1)

## 2017-10-28 LAB — CBC WITH DIFFERENTIAL/PLATELET
BASOS PCT: 0 %
Basophils Absolute: 0 10*3/uL (ref 0.0–0.1)
EOS ABS: 0.3 10*3/uL (ref 0.0–0.7)
EOS PCT: 2 %
HCT: 42.7 % (ref 39.0–52.0)
HEMOGLOBIN: 14.1 g/dL (ref 13.0–17.0)
LYMPHS ABS: 2.4 10*3/uL (ref 0.7–4.0)
Lymphocytes Relative: 17 %
MCH: 32.3 pg (ref 26.0–34.0)
MCHC: 33 g/dL (ref 30.0–36.0)
MCV: 97.9 fL (ref 78.0–100.0)
Monocytes Absolute: 1.6 10*3/uL — ABNORMAL HIGH (ref 0.1–1.0)
Monocytes Relative: 11 %
NEUTROS PCT: 70 %
Neutro Abs: 10.2 10*3/uL — ABNORMAL HIGH (ref 1.7–7.7)
PLATELETS: 227 10*3/uL (ref 150–400)
RBC: 4.36 MIL/uL (ref 4.22–5.81)
RDW: 13.7 % (ref 11.5–15.5)
WBC: 14.5 10*3/uL — AB (ref 4.0–10.5)

## 2017-10-28 MED ORDER — DOXYCYCLINE HYCLATE 100 MG PO CAPS: 100.0000 mg | ORAL_CAPSULE | Freq: Two times a day (BID) | ORAL | 0 refills | Status: DC

## 2017-10-28 MED ORDER — DOXYCYCLINE HYCLATE 100 MG PO TABS
100.0000 mg | ORAL_TABLET | Freq: Once | ORAL | Status: AC
  Administered 2017-10-28: 100 mg via ORAL
  Filled 2017-10-28: qty 1

## 2017-10-28 MED ORDER — IOPAMIDOL (ISOVUE-370) INJECTION 76%
INTRAVENOUS | Status: AC
  Administered 2017-10-28: 80 mL
  Filled 2017-10-28: qty 100

## 2017-10-28 MED ORDER — ALBUTEROL SULFATE HFA 108 (90 BASE) MCG/ACT IN AERS
2.0000 | INHALATION_SPRAY | RESPIRATORY_TRACT | Status: DC
  Administered 2017-10-28: 2 via RESPIRATORY_TRACT
  Filled 2017-10-28: qty 6.7

## 2017-10-28 MED ORDER — IOPAMIDOL (ISOVUE-300) INJECTION 61%
INTRAVENOUS | Status: AC
  Filled 2017-10-28: qty 100

## 2017-10-28 NOTE — ED Triage Notes (Addendum)
Pt states that he felt fine last night, went to bed-- had difficulty swallowing vitamin 2 days ago, with coughing--  Moist nonproductive cough. Denies fever.  Has appt with dr perrini at 0945 this morning.

## 2017-10-28 NOTE — ED Provider Notes (Signed)
[facility] EMERGENCY DEPARTMENT Provider Note   CSN: [id number] Arrival date & time: 10/28/17  0701     History   Chief Complaint Chief Complaint  Patient presents with  . Shortness of Breath  . Cough    HPI Lance [name] is a 84 y.o. male.  HPI Patient is an 84 year old male who is been somewhat sedentary this winter who presents the emergency department with cough and shortness of breath.  He felt short of breath this morning.  He feels like last night he may have aspirated 1 of his pills.  He states he coughed the majority of it back up.  He still feels like there is something abnormal in his right lung.  No fevers or chills but does report productive cough and shortness of breath today.  Denies unilateral leg swelling.  No history of DVT or pulmonary embolism.  Symptoms are mild in severity.  No other complaints.   Past Medical History:  Diagnosis Date  . Alcohol abuse    Remote hx, stopped in 1970  . Anxiety   . BPH (benign prostatic hypertrophy) with urinary obstruction   . Colon polyps   . Depression   . Diverticulosis   . Glaucoma   . Headache    sinus headaches  . Hypercholesterolemia   . Hypertension   . Hypogonadism male   . OSA on CPAP   . Osteoarthritis   . Osteopenia   . Sleep apnea   . Vitamin D deficiency     Patient Active Problem List   Diagnosis Date Noted  . Pes anserinus bursitis of left knee 03/01/2017  . Primary osteoarthritis of right knee 02/15/2017  . Dizziness 10/28/2016  . 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 Osteoarthritis of left knee 02/25/2016  . S/P left unicompartmental knee replacement 02/25/2016  . Spinal stenosis at L4-L5 level 11/11/2015  . Aspiration pneumonia of left lower lobe due to gastric secretions ([facility]) 10/22/2015  . Bilateral leg weakness 10/22/2015  . 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  . OSA treated with BiPAP 10/22/2015  . Ambulatory dysfunction  10/22/2015  . CVA (cerebral infarction) 06/03/2013  . Syncope 06/03/2013  . Facial laceration 06/03/2013  . Acute bronchitis 06/03/2013  . Hypertension     Past Surgical History:  Procedure Laterality Date  . APPENDECTOMY  1945  . BACK SURGERY    . EYE SURGERY     bilateral cataracts  . LUMBAR LAMINECTOMY/DECOMPRESSION MICRODISCECTOMY Bilateral 11/11/2015   Procedure: Bilateral L4-5 Laminectomy;  Surgeon: Kary Kos, MD;  Location: [facility];  Service: Neurosurgery;  Laterality: Bilateral;  Bilateral L4-5 Laminectomy  . PARTIAL KNEE ARTHROPLASTY Left 02/25/2016   Procedure: LEFT UNICOMPARTMENTAL KNEE ARTHROPLASTY;  Surgeon: Mcarthur Rossetti, MD;  Location: [facility];  Service: Orthopedics;  Laterality: Left;  . REPLACEMENT UNICONDYLAR JOINT KNEE Left 02/25/2016  . SHOULDER SURGERY Right   . TONSILLECTOMY         Home Medications    Prior to Admission medications   Medication Sig Start Date End Date Taking? Authorizing Provider  acetaminophen (TYLENOL) 500 MG tablet Take 500 mg by mouth every 6 (six) hours as needed (For pain.).   Yes [provider]  aspirin 325 MG tablet Take 650 mg by mouth at bedtime.    Yes [provider]  buPROPion (WELLBUTRIN SR) 150 MG 12 hr tablet Take 150 mg by mouth 2 (two) times daily.   Yes [provider]  diazepam (VALIUM) 10 MG  tablet Take 10 mg by mouth every 8 (eight) hours as needed for anxiety.    Yes [provider]  doxepin (SINEQUAN) 75 MG capsule Take 75 mg by mouth at bedtime.    Yes [provider]  ergocalciferol (VITAMIN D2) 50000 UNITS capsule Take 50,000 Units by mouth every Monday.    Yes [provider]  ezetimibe (ZETIA) 10 MG tablet Take 10 mg by mouth daily. 09/29/16  Yes [provider]  meclizine (ANTIVERT) 12.5 MG tablet Take 1 tablet (12.5 mg total) by mouth 2 (two) times daily. Patient taking differently: Take 25 mg by mouth 2 (two) times daily as needed  (vertigo).  10/31/16  Yes Dessa Phi, DO  mirabegron ER (MYRBETRIQ) 25 MG TB24 tablet Take 25 mg by mouth daily.   Yes [provider]  Multiple Vitamin (MULTIVITAMIN) capsule Take 1 capsule by mouth daily.    Yes [provider]  OMEGA 3 1000 MG CAPS Take 1,000 mg by mouth daily.    Yes [provider]  Timolol Maleate PF 0.5 % SOLN Place 1 drop into both eyes every morning.   Yes [provider]  traMADol (ULTRAM) 50 MG tablet Take 50-100 mg by mouth every 6 (six) hours as needed for severe pain.    Yes [provider]  vitamin C (ASCORBIC ACID) 500 MG tablet Take 500 mg by mouth daily.   Yes [provider]  doxycycline (VIBRAMYCIN) 100 MG capsule Take 1 capsule (100 mg total) by mouth 2 (two) times daily. 10/28/17   Jola Schmidt, MD    Family History Family History  Problem Relation Age of Onset  . Stroke Neg Hx     Social History Social History   Tobacco Use  . Smoking status: Former Smoker    Last attempt to quit: 09/28/1962    Years since quitting: 55.1  . Smokeless tobacco: Never Used  Substance Use Topics  . Alcohol use: No  . Drug use: No     Allergies   Penicillins and Simvastatin   Review of Systems Review of Systems  All other systems reviewed and are negative.    Physical Exam Updated Vital Signs BP (!) 158/92   Pulse 70   Temp 98.1 F (36.7 C) (Oral)   Resp 18   Ht 6' (1.829 m)   Wt 90.7 kg (200 lb)   SpO2 97%   BMI 27.12 kg/m   Physical Exam  Constitutional: He is oriented to person, place, and time. He appears well-developed and well-nourished.  HENT:  Head: Normocephalic and atraumatic.  Eyes: EOM are normal.  Neck: Normal range of motion.  Cardiovascular: Normal rate, regular rhythm, normal heart sounds and intact distal pulses.  Pulmonary/Chest: Effort normal and breath sounds normal. No respiratory distress.  Abdominal: Soft. He exhibits no distension. There is no tenderness.    Musculoskeletal: Normal range of motion.  Neurological: He is alert and oriented to person, place, and time.  Skin: Skin is warm and dry.  Psychiatric: He has a normal mood and affect. Judgment normal.  Nursing note and vitals reviewed.    ED Treatments / Results  Labs (all labs ordered are listed, but only abnormal results are displayed) Labs Reviewed  CBC WITH DIFFERENTIAL/PLATELET - Abnormal; Notable for the following components:      Result Value   WBC 14.5 (*)    Neutro Abs 10.2 (*)    Monocytes Absolute 1.6 (*)    All other components within normal limits  COMPREHENSIVE METABOLIC PANEL - Abnormal; Notable for the following components:   Glucose, Bld 107 (*)    BUN 21 (*)    Creatinine, Ser 1.26 (*)    Calcium 8.6 (*)    ALT 15 (*)    GFR calc non Af Amer 51 (*)    GFR calc Af Amer 59 (*)    All other components within normal limits    EKG  EKG Interpretation None       Radiology Dg Chest 2 View  Result Date: 10/28/2017 CLINICAL DATA:  Difficulty swallowing cough. EXAM: CHEST  2 VIEW COMPARISON:  CT 10/28/2016, 10/11/2015.  Chest x-ray 10/28/2016 FINDINGS: Mediastinum and hilar structures normal. Mild basilar subsegmental atelectasis and/or scarring. No pleural effusion or pneumothorax. Heart size stable. No pulmonary venous congestion. Degenerative changes thoracic spine. IMPRESSION: Persistent bibasilar atelectasis and/or scarring. No significant change from prior exams. Electronically Signed   By: Marcello Moores  Register   On: 10/28/2017 07:59   Ct Angio Chest Pe W And/or Wo Contrast  Result Date: 10/28/2017 CLINICAL DATA:  Cough and shortness of Breath EXAM: CT ANGIOGRAPHY CHEST WITH CONTRAST TECHNIQUE: Multidetector CT imaging of the chest was performed using the standard protocol during bolus administration of intravenous contrast. Multiplanar CT image reconstructions and MIPs were obtained to evaluate the vascular anatomy. CONTRAST:  68mL ISOVUE-370 IOPAMIDOL  (ISOVUE-370) INJECTION 76% COMPARISON:  Plain film from earlier in the same day. FINDINGS: Cardiovascular: Atherosclerotic changes of the aorta are noted without aneurysmal dilatation. Opacification of the aorta is limited. No cardiac enlargement is seen. Coronary calcifications are noted. The pulmonary artery demonstrates adequate opacification. Normal branching pattern is noted. No filling defects to suggest pulmonary embolism are identified. Mediastinum/Nodes: The thoracic inlet is within normal limits. No hilar or mediastinal adenopathy is seen. The esophagus is unremarkable. Lungs/Pleura: Lungs are well aerated bilaterally. Mild bibasilar atelectatic changes are noted right slightly greater than left. Some changes of mucous plugging in the right lower lobe are noted. Upper Abdomen: Visualized upper abdomen demonstrates fatty interdigitation of the pancreas. No other focal abnormality is seen. Musculoskeletal: Degenerative changes of the thoracic spine are seen. No acute bony abnormality is noted. Review of the MIP images confirms the above findings. IMPRESSION: No evidence of pulmonary emboli. Bibasilar atelectatic changes right greater than left with some changes of mucous plugging in the right lower lobe. Aortic Atherosclerosis (ICD10-I70.0). Electronically Signed   By: Inez Catalina M.D.   On: 10/28/2017 10:23    Procedures Procedures (including critical care time)  Medications Ordered in ED Medications  albuterol (PROVENTIL HFA;VENTOLIN HFA) 108 (90 Base) MCG/ACT inhaler 2 puff (2 puffs Inhalation Given 10/28/17 1043)  iopamidol (ISOVUE-370) 76 % injection (80 mLs  Contrast Given 10/28/17 1003)  doxycycline (VIBRA-TABS) tablet 100 mg (100 mg Oral Given 10/28/17 1043)     Initial Impression / Assessment and Plan / ED Course  I have reviewed the triage vital signs and the nursing notes.  Pertinent labs & imaging results that were available during my care of the patient were reviewed by me and  considered in my medical decision making (see chart for details).     Suspect acute bronchitis with a likely bibasilar atelectasis leading to some of his exertional shortness of breath.  He has been given an incentive spirometer as well as bronchodilators.  He will be placed on a short course of antibiotics.  CT was negative for PE and negative for large infiltrate.  He feels better after incentive spirometry.  With deep  breathing he does begin to have a productive cough.  I think his sedentary actions over the past several weeks have led to some atelectasis.  Close primary care follow-up.  Patient and family understand to return the emergency department for new or worsening symptoms.  Final Clinical Impressions(s) / ED Diagnoses   Final diagnoses:  Acute bronchitis, unspecified organism  Shortness of breath    ED Discharge Orders        Ordered    doxycycline (VIBRAMYCIN) 100 MG capsule  2 times daily     10/28/17 1109       Jola Schmidt, MD 10/28/17 1123

## 2017-10-28 NOTE — ED Notes (Signed)
Patient 88% on room air. 2L o2 applied

## 2017-10-28 NOTE — ED Notes (Signed)
Patient transported to CT 

## 2017-10-28 NOTE — Discharge Instructions (Signed)
Use the incentive spirometry 10 times every hour while awake.   Walk daily

## 2017-10-28 NOTE — ED Notes (Signed)
Pt ambulated with 1 person assists and cane. Pt sts that its normal, for him to walk with someone on his side and cane.

## 2017-11-15 ENCOUNTER — Ambulatory Visit (INDEPENDENT_AMBULATORY_CARE_PROVIDER_SITE_OTHER): Payer: Medicare Other

## 2017-11-15 ENCOUNTER — Ambulatory Visit (INDEPENDENT_AMBULATORY_CARE_PROVIDER_SITE_OTHER): Payer: Medicare Other | Admitting: Physician Assistant

## 2017-11-15 ENCOUNTER — Encounter (INDEPENDENT_AMBULATORY_CARE_PROVIDER_SITE_OTHER): Payer: Self-pay | Admitting: Physician Assistant

## 2017-11-15 DIAGNOSIS — M25561 Pain in right knee: Secondary | ICD-10-CM | POA: Diagnosis not present

## 2017-11-15 DIAGNOSIS — G8929 Other chronic pain: Secondary | ICD-10-CM

## 2017-11-15 DIAGNOSIS — M1711 Unilateral primary osteoarthritis, right knee: Secondary | ICD-10-CM

## 2017-11-15 MED ORDER — LIDOCAINE HCL 1 % IJ SOLN
3.0000 mL | INTRAMUSCULAR | Status: AC | PRN
  Administered 2017-11-15: 3 mL

## 2017-11-15 MED ORDER — METHYLPREDNISOLONE ACETATE 40 MG/ML IJ SUSP
40.0000 mg | INTRAMUSCULAR | Status: AC | PRN
  Administered 2017-11-15: 40 mg via INTRA_ARTICULAR

## 2017-11-15 NOTE — Progress Notes (Signed)
   Procedure Note  Patient: Lance [name]             Date of Birth: 1932-02-15           MRN: [id number]             Visit Date: 11/15/2017 HPI: Mr. Lance [name] comes in today due to right knee pain.  He states a week ago that his pain was severe in the right knee.  He has had no injury to the knee.  He had a previous cortisone injection in the right knee presents tenderness area that helped for about 6 months.  He states he is not interested no surgery for the knee.  He does have a trip planned to [place] and then over to [place] in late May.  Physical exam right knee is slight tenderness of the anterior medial aspect of the joint line.  No instability valgus varus stressing.  No tenderness over the pes anserinus.  No abnormal warmth erythema or effusion or edema.  Radiographs: AP lateral views right knee show tricompartmental arthritic changes with near bone-on-bone medial compartment.  No acute fractures.  Procedures: Visit Diagnoses: Chronic pain of right knee - Plan: XR Knee 1-2 Views Right, Large Joint Inj: R knee  Primary osteoarthritis of right knee  Large Joint Inj: R knee on 11/15/2017 10:20 AM Indications: pain Details: 22 G 1.5 in needle, anterolateral approach  Arthrogram: No  Medications: 3 mL lidocaine 1 %; 40 mg methylPREDNISolone acetate 40 MG/ML Outcome: tolerated well, no immediate complications Procedure, treatment alternatives, risks and benefits explained, specific risks discussed. Consent was given by the patient. Patient was prepped and draped in the usual sterile fashion.    Plan: He is given a prescription for a wheelchair due to his gait balance issues along with the tricompartmental arthritis of the right knee.  We will see him back on an as-needed basis.  He can come back and this prior to his trip in May and we can do a cortisone injection in the knee at that time if he so wishes.  Is also given a pullover open patella knee sleeve.

## 2018-01-03 ENCOUNTER — Ambulatory Visit (INDEPENDENT_AMBULATORY_CARE_PROVIDER_SITE_OTHER): Payer: Medicare Other | Admitting: Physician Assistant

## 2018-01-03 ENCOUNTER — Ambulatory Visit (INDEPENDENT_AMBULATORY_CARE_PROVIDER_SITE_OTHER): Payer: Medicare Other

## 2018-01-03 ENCOUNTER — Encounter (INDEPENDENT_AMBULATORY_CARE_PROVIDER_SITE_OTHER): Payer: Self-pay | Admitting: Physician Assistant

## 2018-01-03 DIAGNOSIS — M5442 Lumbago with sciatica, left side: Secondary | ICD-10-CM

## 2018-01-03 DIAGNOSIS — G8929 Other chronic pain: Secondary | ICD-10-CM | POA: Diagnosis not present

## 2018-01-03 MED ORDER — METHYLPREDNISOLONE 4 MG PO TABS: ORAL_TABLET | ORAL | 0 refills | Status: DC

## 2018-01-03 NOTE — Progress Notes (Signed)
Office Visit Note   Patient: Lance [name]           Date of Birth: [birth year]           MRN: [id number] Visit Date: 01/03/2018              Requested by: Crist Infante, MD [address] [place], [place] [address] PCP: Crist Infante, MD   Assessment & Plan: Visit Diagnoses:  1. Chronic left-sided low back pain with left-sided sciatica     Plan: We will place him on a Medrol Dosepak.  He will continue to work with his trainer on overall core strengthening hamstring stretching.  In regards to his upcoming travel did not recommend that he travel in a back brace.  We will see him back on as-needed basis pain persist or becomes worse.  Follow-Up Instructions: Return if symptoms worsen or fail to improve.   Orders:  Orders Placed This Encounter  Procedures  . XR Lumbar Spine 2-3 Views   Meds ordered this encounter  Medications  . methylPREDNISolone (MEDROL) 4 MG tablet    Sig: Take as directed    Dispense:  21 tablet    Refill:  0      Procedures: No procedures performed   Clinical Data: No additional findings.   Subjective: Chief Complaint  Patient presents with  . Lower Back - Pain    HPI Lance [name] returns today complaining of low back pain that is been ongoing  she had no known injuries or length.  For the past 3-4 months.  He has low back pain that radiates into his buttocks down to his left hip but no radicular symptoms down either leg.  He ambulates with a cane mostly just for balance but overall feels like he is improving in regards to his balance and is able to ambulate with a cane.  He has had no bowel bladder dysfunction.  No waking pain.  Has had recent what sounds like aspiration pneumonia back in January when she was placed on doxycycline as this is resolved.  Does have an upcoming trip overseas which will involve a 9-hour plane ride. Review of Systems Please see HPI otherwise negative  Objective: Vital Signs: There were no vitals taken for this  visit.  Physical Exam  Constitutional: He is oriented to person, place, and time. He appears well-developed and well-nourished.  Cardiovascular: Intact distal pulses.  Pulmonary/Chest: Effort normal.  Neurological: He is alert and oriented to person, place, and time.  Skin: He is not diaphoretic.  Psychiatric: He has a normal mood and affect.    Ortho Exam Bilateral lower extremities he has 5 out of 5 strength against resistance.  Negative straight leg raise bilaterally.  He has good range of motion left hip without pain.  Right hip he has decreased internal rotation.  He is able to rise on his own from a seated position and ambulate without a assistive device. Specialty Comments:  No specialty comments available.  Imaging: Xr Lumbar Spine 2-3 Views  Result Date: 01/03/2018  Lumbar spine AP lateral views: No acute fracture.  Significant facet arthritis involving the lower facet joints.  Loss of normal lordotic curvature.  Severe degenerative disc disease except for L1-L2 which is well preserved.  No spondylolisthesis.  Significant scoliosis of the spine seen on the AP view.  Severe arthrosclerosis of any O's negative.    PMFS History: Patient Active Problem List   Diagnosis Date Noted  . Pes anserinus bursitis of left  knee 03/01/2017  . Primary osteoarthritis of right knee 02/15/2017  . Dizziness 10/28/2016  . 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 Osteoarthritis of left knee 02/25/2016  . S/P left unicompartmental knee replacement 02/25/2016  . Spinal stenosis at L4-L5 level 11/11/2015  . Aspiration pneumonia of left lower lobe due to gastric secretions ([facility]) 10/22/2015  . Bilateral leg weakness 10/22/2015  . 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  . OSA treated with BiPAP 10/22/2015  . Ambulatory dysfunction 10/22/2015  . CVA (cerebral infarction) 06/03/2013  . Syncope 06/03/2013  . Facial laceration 06/03/2013  . Acute bronchitis 06/03/2013  .  Hypertension    Past Medical History:  Diagnosis Date  . Alcohol abuse    Remote hx, stopped in 1970  . Anxiety   . BPH (benign prostatic hypertrophy) with urinary obstruction   . Colon polyps   . Depression   . Diverticulosis   . Glaucoma   . Headache    sinus headaches  . Hypercholesterolemia   . Hypertension   . Hypogonadism male   . OSA on CPAP   . Osteoarthritis   . Osteopenia   . Sleep apnea   . Vitamin D deficiency     Family History  Problem Relation Age of Onset  . Stroke Neg Hx     Past Surgical History:  Procedure Laterality Date  . APPENDECTOMY  1945  . BACK SURGERY    . EYE SURGERY     bilateral cataracts  . LUMBAR LAMINECTOMY/DECOMPRESSION MICRODISCECTOMY Bilateral 11/11/2015   Procedure: Bilateral L4-5 Laminectomy;  Surgeon: Kary Kos, MD;  Location: [facility];  Service: Neurosurgery;  Laterality: Bilateral;  Bilateral L4-5 Laminectomy  . PARTIAL KNEE ARTHROPLASTY Left 02/25/2016   Procedure: LEFT UNICOMPARTMENTAL KNEE ARTHROPLASTY;  Surgeon: Mcarthur Rossetti, MD;  Location: [facility];  Service: Orthopedics;  Laterality: Left;  . REPLACEMENT UNICONDYLAR JOINT KNEE Left 02/25/2016  . SHOULDER SURGERY Right   . TONSILLECTOMY     Social History   Occupational History  . Occupation: retired    Fish farm manager: RETIRED  Tobacco Use  . Smoking status: Former Smoker    Last attempt to quit: 09/28/1962    Years since quitting: 55.3  . Smokeless tobacco: Never Used  Substance and Sexual Activity  . Alcohol use: No  . Drug use: No  . Sexual activity: Not on file

## 2018-01-05 ENCOUNTER — Ambulatory Visit: Payer: Medicare Other | Admitting: Neurology

## 2018-01-24 ENCOUNTER — Encounter: Payer: Self-pay | Admitting: Neurology

## 2018-01-26 ENCOUNTER — Encounter: Payer: Self-pay | Admitting: Neurology

## 2018-01-26 ENCOUNTER — Ambulatory Visit: Payer: Medicare Other | Admitting: Neurology

## 2018-01-26 ENCOUNTER — Other Ambulatory Visit: Payer: Self-pay | Admitting: Neurology

## 2018-01-26 VITALS — BP 143/84 | HR 76 | Ht 72.0 in | Wt 209.5 lb

## 2018-01-26 DIAGNOSIS — Z9989 Dependence on other enabling machines and devices: Principal | ICD-10-CM

## 2018-01-26 DIAGNOSIS — G4731 Primary central sleep apnea: Secondary | ICD-10-CM | POA: Diagnosis not present

## 2018-01-26 DIAGNOSIS — G4733 Obstructive sleep apnea (adult) (pediatric): Secondary | ICD-10-CM | POA: Diagnosis not present

## 2018-01-26 DIAGNOSIS — R55 Syncope and collapse: Secondary | ICD-10-CM | POA: Diagnosis not present

## 2018-01-26 DIAGNOSIS — G4739 Other sleep apnea: Secondary | ICD-10-CM

## 2018-01-26 NOTE — Progress Notes (Signed)
PATIENT: Lance [name] DOB: 22-Feb-1932  REASON FOR VISIT: follow up- OSA on CPAP HISTORY FROM: patient  HISTORY OF PRESENT ILLNESS:  Today 01/26/18 , is I have the pleasure of seeing Mr. Lance [name] and his wife today and they return visit, the patient recently was seen in the hospital for an aspiration he had swallowed a pill which then blocked part of his windpipe or bronchial tree.  He had an x-ray followed by a CT of the chest which showed that the pill has been well resolved.  He was able to cough up at least part of the medication.  Otherwise his medication is unchanged the patient has been updated on his vaccinations, he has a pneumonia shot he also has usually an influenza vaccination yearly.  We are meeting today to follow-up on his 2 main concerns one is a CPAP compliance another one is a memory test.  Today's MMSE was 26/ 30 .  The patient's compliance for CPAP has been excellent he has used the machine 29 out of 30 days and on 23 days over 4 hours with a compliance of 77%.  Average daily use a time is 6 hours and 27 minutes.  CPAP is currently set at 13 cm pressure was 1 cm EPR but the patient has a lot of residual obstructive apneas.  Current AHI was 14 and he had 38 minutes of Cheyne-Stokes respiration.  For this reason I will increase his CPAP pressure to 15 cm and allow for 3 cm EPR expiratory pressure relief he also struggles with some air leakage still air passing by the mask. He uses an airfit foam mask, would like to be refitted. I recommend a so clean machine.       MM: Mr. Lance [name] is an 84 year old male with a history of obstructive sleep apnea on BiPAP and mild memory trouble. He returns today for follow-up. The patient states that he is using his old machine. He did not bring her memory card with him. He is unsure if his CPAP even has a memory card. He states that he is using the machine nightly. He does note dry mouth. He states that he has his humidity  turned up unsure if it's that the max. The patient feels that his memory has remained the same. He is able to complete all ADLs independently. He does not operate a motor vehicle. States that he is sleeping well. Reports that he does have orthostatic hypotension. He has to stand very slowly in order not to get dizzy. He returns today for an evaluation.  HISTORY history from 04/27/2016. As the pleasure of meeting the Lance [name]'s today again Mr. Lance [name] underwent a Pap titration on 01/23/2016 which ended up to require BiPAP. His baseline AHI was 45.6 per hour of sleep with 182 minutes of oxygen desaturation. Sleep efficiency was 81.6%. BiPAP at 23/18 was the most effective but difficult to tolerate the AHI was 0.0 with an oxygen nadir of 91. In the meantime we have reduced the pressure based on the patient's report 217/13 and his residual AHI is 3.9, certainly not a bad result. However he has a lot of ear leaks that wake him and his wife at night. I exchanged his interface today from a mirage quattro to a [employer]. He only sleeps 4 hours now ! I will bring the patient over to the sleep lab for that we can train him how to put the mask on when in bed. This may be accounted for  gravity changes. The patient also underwent today a [place] cognitive assessment and he scored 23 out of 30 points. He was able to recall 4 out of 5 words. He did have some problems today with the serial 7 subtraction he was able to draw a cube, a clock face and name all 3 animals.  REVIEW OF SYSTEMS: Out of a complete 14 system review of symptoms, the patient complains only of the following symptoms, and all other reviewed systems are negative.  Joint pain, walking difficulty, hearing loss, coughing, airway irritation. Dysphagia.   ALLERGIES: Allergies  Allergen Reactions  . Penicillins Anaphylaxis, Hives and Other (See Comments)    Has patient had a PCN reaction causing immediate rash, facial/tongue/throat swelling,  SOB or lightheadedness with hypotension: Yes Has patient had a PCN reaction causing severe rash involving mucus membranes or skin necrosis: No Has patient had a PCN reaction that required hospitalization Yes Has patient had a PCN reaction occurring within the last 10 years: No If all of the above answers are "NO", then may proceed with Cephalosporin use.   . Simvastatin Other (See Comments)    Leg weakness    HOME MEDICATIONS: Outpatient Medications Prior to Visit  Medication Sig Dispense Refill  . acetaminophen (TYLENOL) 500 MG tablet Take 500 mg by mouth every 6 (six) hours as needed (For pain.).    Marland Kitchen aspirin 325 MG tablet Take 650 mg by mouth at bedtime.     Marland Kitchen buPROPion (WELLBUTRIN SR) 150 MG 12 hr tablet Take 150 mg by mouth 2 (two) times daily.    . diazepam (VALIUM) 10 MG tablet Take 10 mg by mouth every 8 (eight) hours as needed for anxiety.     Marland Kitchen doxepin (SINEQUAN) 75 MG capsule Take 75 mg by mouth at bedtime.     . ergocalciferol (VITAMIN D2) 50000 UNITS capsule Take 50,000 Units by mouth every Monday.     . ezetimibe (ZETIA) 10 MG tablet Take 10 mg by mouth daily.  11  . finasteride (PROSCAR) 5 MG tablet Take 5 mg by mouth every morning.  3  . meclizine (ANTIVERT) 12.5 MG tablet Take 1 tablet (12.5 mg total) by mouth 2 (two) times daily. (Patient taking differently: Take 25 mg by mouth 2 (two) times daily as needed (vertigo). ) 30 tablet 0  . mirabegron ER (MYRBETRIQ) 25 MG TB24 tablet Take 25 mg by mouth daily.    . Multiple Vitamin (MULTIVITAMIN) capsule Take 1 capsule by mouth daily.     . tamsulosin (FLOMAX) 0.4 MG CAPS capsule Take 1 capsule by mouth daily.  3  . doxycycline (VIBRAMYCIN) 100 MG capsule Take 1 capsule (100 mg total) by mouth 2 (two) times daily. 14 capsule 0  . methylPREDNISolone (MEDROL) 4 MG tablet Take as directed 21 tablet 0  . OMEGA 3 1000 MG CAPS Take 1,000 mg by mouth daily.     . Timolol Maleate PF 0.5 % SOLN Place 1 drop into both eyes every  morning.    . traMADol (ULTRAM) 50 MG tablet Take 50-100 mg by mouth every 6 (six) hours as needed for severe pain.     . vitamin C (ASCORBIC ACID) 500 MG tablet Take 500 mg by mouth daily.     No facility-administered medications prior to visit.     PAST MEDICAL HISTORY: Past Medical History:  Diagnosis Date  . Alcohol abuse    Remote hx, stopped in 1970  . Anxiety   . BPH (benign prostatic hypertrophy) with  urinary obstruction   . Colon polyps   . Depression   . Diverticulosis   . Glaucoma   . Headache    sinus headaches  . Hypercholesterolemia   . Hypertension   . Hypogonadism male   . OSA on CPAP   . Osteoarthritis   . Osteopenia   . Sleep apnea   . Vitamin D deficiency     PAST SURGICAL HISTORY: Past Surgical History:  Procedure Laterality Date  . APPENDECTOMY  1945  . BACK SURGERY    . EYE SURGERY     bilateral cataracts  . LUMBAR LAMINECTOMY/DECOMPRESSION MICRODISCECTOMY Bilateral 11/11/2015   Procedure: Bilateral L4-5 Laminectomy;  Surgeon: Kary Kos, MD;  Location: [facility];  Service: Neurosurgery;  Laterality: Bilateral;  Bilateral L4-5 Laminectomy  . PARTIAL KNEE ARTHROPLASTY Left 02/25/2016   Procedure: LEFT UNICOMPARTMENTAL KNEE ARTHROPLASTY;  Surgeon: Mcarthur Rossetti, MD;  Location: [facility];  Service: Orthopedics;  Laterality: Left;  . REPLACEMENT UNICONDYLAR JOINT KNEE Left 02/25/2016  . SHOULDER SURGERY Right   . TONSILLECTOMY      FAMILY HISTORY: Family History  Problem Relation Age of Onset  . Stroke Neg Hx     SOCIAL HISTORY: Social History   Socioeconomic History  . Marital status: Married    Spouse name: Jan  . Number of children: 3  . Years of education: Not on file  . Highest education level: Not on file  Occupational History  . Occupation: retired    Fish farm manager: RETIRED  Social Needs  . Financial resource strain: Not on file  . Food insecurity:    Worry: Not on file    Inability: Not on file  . Transportation needs:     Medical: Not on file    Non-medical: Not on file  Tobacco Use  . Smoking status: Former Smoker    Last attempt to quit: 09/28/1962    Years since quitting: 55.3  . Smokeless tobacco: Never Used  Substance and Sexual Activity  . Alcohol use: No  . Drug use: No  . Sexual activity: Not on file  Lifestyle  . Physical activity:    Days per week: Not on file    Minutes per session: Not on file  . Stress: Not on file  Relationships  . Social connections:    Talks on phone: Not on file    Gets together: Not on file    Attends religious service: Not on file    Active member of club or organization: Not on file    Attends meetings of clubs or organizations: Not on file    Relationship status: Not on file  . Intimate partner violence:    Fear of current or ex partner: Not on file    Emotionally abused: Not on file    Physically abused: Not on file    Forced sexual activity: Not on file  Other Topics Concern  . Not on file  Social History Narrative   Lives with wife      PHYSICAL EXAM  Vitals:   01/26/18 1104  BP: (!) 143/84  Pulse: 76  Weight: 209 lb 8 oz (95 kg)  Height: 6' (1.829 m)   Body mass index is 28.41 kg/m.   MMSE - Mini Mental State Exam 01/26/2018 04/20/2017 07/06/2016  Orientation to time 4 5 4   Orientation to Place 3 4 4   Registration 3 3 3   Attention/ Calculation 5 3 3   Recall 3 2 2   Language- name 2 objects 2 2  2  Language- repeat 1 0 1  Language- follow 3 step command 3 3 3   Language- read & follow direction 1 1 1   Write a sentence 1 1 1   Copy design 0 0 0  Total score 26 24 24      Generalized: Well developed, in no acute distress - he has diverticulitis related abdominal discomfort. IBS ? Back pain. Knee pain, not dysphagia.   Orthostatic hypotension, syncope and presyncope. Dysphonia, falls.   Neurological examination  Mentation: Alert oriented to time, place, history taking. Dysphonia .   Follows all commands speech and language  fluent Cranial nerve : Pupils were equal round reactive to light. Extraocular movements were full, visual field were full on confrontational test. No nystagmus.  Facial sensation and strength were normal. Head turning and shoulder shrug  were weak, but symmetric.  Motor:   symmetric motor tone is noted throughout. Elevated tone, with mild cog-wheeling, muscle atrophy, weakness.  Sensory: Sensory testing is intact to soft touch on all 4 extremities. No evidence of extinction is noted.  Coordination:  finger-nose bilaterally intact .  Gait and station: Gait is unsteady. Tandem gait not attempted. He used a cane today.  Reflexes: Deep tendon reflexes are attenuated bilaterally.   DIAGNOSTIC DATA (LABS, IMAGING, TESTING) - I reviewed patient records, labs, notes, testing and imaging myself where available.  Lab Results  Component Value Date   WBC 14.5 (H) 10/28/2017   HGB 14.1 10/28/2017   HCT 42.7 10/28/2017   MCV 97.9 10/28/2017   PLT 227 10/28/2017      Component Value Date/Time   NA 140 10/28/2017 0712   K 3.6 10/28/2017 0712   CL 102 10/28/2017 0712   CO2 27 10/28/2017 0712   GLUCOSE 107 (H) 10/28/2017 0712   BUN 21 (H) 10/28/2017 0712   CREATININE 1.26 (H) 10/28/2017 0712   CALCIUM 8.6 (L) 10/28/2017 0712   PROT 6.6 10/28/2017 0712   ALBUMIN 3.5 10/28/2017 0712   AST 24 10/28/2017 0712   ALT 15 (L) 10/28/2017 0712   ALKPHOS 58 10/28/2017 0712   BILITOT 0.9 10/28/2017 0712   GFRNONAA 51 (L) 10/28/2017 0712   GFRAA 59 (L) 10/28/2017 0712          ASSESSMENT AND PLAN 84 y.o. year old male  has a past medical history of Alcohol abuse, Anxiety, BPH (benign prostatic hypertrophy) with urinary obstruction, Colon polyps, Depression, Diverticulosis, Glaucoma, Headache, Hypercholesterolemia, Hypertension, Hypogonadism male, OSA on CPAP, Osteoarthritis, Osteopenia, Sleep apnea, and Vitamin D deficiency. here with:  1. Complex  sleep apnea on CPAP-  High compliance but high  residual AHI and with cheyne stokes breathing. On CPAP- needs adjustment to pressures of 13 cm to 15 cm and 3 cm EPR, and will ask for a refitting of his mask.   2. Memory disturbance- MCI with MMSE 26/30. First time in one year he no longer struggles with memory.   3. Syncope- may be reflective of dysautonomia, has  Muscle atrophy, dysphonia, weakness. No Numbness reported. Next visit with NP and with orthostatic pressures and pulse rate. Keep up with hydration.   Larey Seat, MD    01/26/2018, 11:27 AM [facility] [address] [place], [place] [address] [phone]

## 2018-02-03 ENCOUNTER — Telehealth (INDEPENDENT_AMBULATORY_CARE_PROVIDER_SITE_OTHER): Payer: Self-pay | Admitting: Orthopaedic Surgery

## 2018-02-03 NOTE — Telephone Encounter (Signed)
Ok to give

## 2018-02-03 NOTE — Telephone Encounter (Signed)
Yes ok to give

## 2018-02-03 NOTE — Telephone Encounter (Signed)
Patients wife called on behalf of patient wanting to know if he could get a left knee brace, he already has a right knee brace. CB # [phone]

## 2018-02-04 ENCOUNTER — Encounter (INDEPENDENT_AMBULATORY_CARE_PROVIDER_SITE_OTHER): Payer: Self-pay

## 2018-02-04 ENCOUNTER — Ambulatory Visit (INDEPENDENT_AMBULATORY_CARE_PROVIDER_SITE_OTHER): Payer: Medicare Other | Admitting: Physician Assistant

## 2018-02-04 DIAGNOSIS — M1711 Unilateral primary osteoarthritis, right knee: Secondary | ICD-10-CM

## 2018-02-04 NOTE — Telephone Encounter (Signed)
Patient came in and was fitted with brace

## 2018-02-04 NOTE — Telephone Encounter (Signed)
IC s/w wife, she will call me back to let me know what size knee sleeve he has, and I can put one up front for him.  She will pick it up.

## 2018-02-04 NOTE — Progress Notes (Signed)
Patient came in office this afternoon and was fitted with knee sleeve right knee.

## 2018-06-22 ENCOUNTER — Other Ambulatory Visit: Payer: Self-pay

## 2018-06-22 ENCOUNTER — Emergency Department (HOSPITAL_COMMUNITY)
Admission: EM | Admit: 2018-06-22 | Discharge: 2018-06-22 | Disposition: A | Discharge status: Home / Self Care | Payer: Medicare Other | Attending: Emergency Medicine | Admitting: Emergency Medicine

## 2018-06-22 DIAGNOSIS — Z79899 Other long term (current) drug therapy: Secondary | ICD-10-CM | POA: Diagnosis not present

## 2018-06-22 DIAGNOSIS — Z7982 Long term (current) use of aspirin: Secondary | ICD-10-CM | POA: Diagnosis not present

## 2018-06-22 DIAGNOSIS — R531 Weakness: Secondary | ICD-10-CM | POA: Diagnosis present

## 2018-06-22 DIAGNOSIS — Z87891 Personal history of nicotine dependence: Secondary | ICD-10-CM | POA: Diagnosis not present

## 2018-06-22 DIAGNOSIS — N390 Urinary tract infection, site not specified: Secondary | ICD-10-CM | POA: Diagnosis not present

## 2018-06-22 DIAGNOSIS — I10 Essential (primary) hypertension: Secondary | ICD-10-CM | POA: Insufficient documentation

## 2018-06-22 DIAGNOSIS — Z96652 Presence of left artificial knee joint: Secondary | ICD-10-CM | POA: Insufficient documentation

## 2018-06-22 LAB — COMPREHENSIVE METABOLIC PANEL
ALT: 17 U/L (ref 0–44)
AST: 17 U/L (ref 15–41)
Albumin: 3.2 g/dL — ABNORMAL LOW (ref 3.5–5.0)
Alkaline Phosphatase: 55 U/L (ref 38–126)
Anion gap: 7 (ref 5–15)
BILIRUBIN TOTAL: 1.1 mg/dL (ref 0.3–1.2)
BUN: 9 mg/dL (ref 8–23)
CO2: 29 mmol/L (ref 22–32)
CREATININE: 0.91 mg/dL (ref 0.61–1.24)
Calcium: 8.5 mg/dL — ABNORMAL LOW (ref 8.9–10.3)
Chloride: 100 mmol/L (ref 98–111)
GFR calc Af Amer: >60 mL/min (ref >60)
Glucose, Bld: 118 mg/dL — ABNORMAL HIGH (ref 70–99)
Potassium: 4 mmol/L (ref 3.5–5.1)
Sodium: 136 mmol/L (ref 135–145)
TOTAL PROTEIN: 6.5 g/dL (ref 6.5–8.1)

## 2018-06-22 LAB — CBC WITH DIFFERENTIAL/PLATELET
BASOS ABS: 0 10*3/uL (ref 0.0–0.1)
Basophils Relative: 0 %
EOS ABS: 0 10*3/uL (ref 0.0–0.7)
EOS PCT: 0 %
HCT: 35.8 % — ABNORMAL LOW (ref 39.0–52.0)
Hemoglobin: 12.1 g/dL — ABNORMAL LOW (ref 13.0–17.0)
Lymphocytes Relative: 4 %
Lymphs Abs: 0.7 10*3/uL (ref 0.7–4.0)
MCH: 31.8 pg (ref 26.0–34.0)
MCHC: 33.8 g/dL (ref 30.0–36.0)
MCV: 94.2 fL (ref 78.0–100.0)
Monocytes Absolute: 1.9 10*3/uL — ABNORMAL HIGH (ref 0.1–1.0)
Monocytes Relative: 11 %
Neutro Abs: 15.3 10*3/uL — ABNORMAL HIGH (ref 1.7–7.7)
Neutrophils Relative %: 85 %
PLATELETS: 232 10*3/uL (ref 150–400)
RBC: 3.8 MIL/uL — AB (ref 4.22–5.81)
RDW: 12.9 % (ref 11.5–15.5)
WBC: 18 10*3/uL — AB (ref 4.0–10.5)

## 2018-06-22 LAB — URINALYSIS, ROUTINE W REFLEX MICROSCOPIC
BACTERIA UA: NONE SEEN
BILIRUBIN URINE: NEGATIVE
Glucose, UA: NEGATIVE mg/dL
KETONES UR: 5 mg/dL — AB
NITRITE: NEGATIVE
Protein, ur: NEGATIVE mg/dL
RBC / HPF: >50 RBC/hpf — ABNORMAL HIGH (ref 0–5)
Specific Gravity, Urine: 1.006 (ref 1.005–1.030)
pH: 8 (ref 5.0–8.0)

## 2018-06-22 LAB — I-STAT CG4 LACTIC ACID, ED: LACTIC ACID, VENOUS: 1.35 mmol/L (ref 0.5–1.9)

## 2018-06-22 MED ORDER — ACETAMINOPHEN 500 MG PO TABS
1000.0000 mg | ORAL_TABLET | Freq: Once | ORAL | Status: AC
  Administered 2018-06-22: 1000 mg via ORAL
  Filled 2018-06-22: qty 2

## 2018-06-22 NOTE — Discharge Instructions (Addendum)
As discussed, your evaluation today has been largely reassuring.  But, it is important that you monitor your condition carefully, and do not hesitate to return to the ED if you develop new, or concerning changes in your condition. ? ?Otherwise, please follow-up with your physician for appropriate ongoing care. ? ?

## 2018-06-22 NOTE — Discharge Planning (Addendum)
[facility] regarding [facility] services.  EDSW spoke with pt at bedside to offer choice for [facility] services.  Pt chose [facility] to render services.  Edwinna Areola of [facility] notified.

## 2018-06-22 NOTE — Progress Notes (Addendum)
CSW spoke with patient and patient's wife at bedside. Per patient's wife, patient has been diagnosed with UTI. Per wife, patient spent most of yesterday going back and forth from the bathroom and became weak. Per wife, patient was unable to get off the floor so they called EMS. Per patient and wife, they live in a one story home and patient has wheelchair, cane and walker. Per patient's wife, they feel that the home they live in is suitable for patient's needs. CSW inquired about if family had [facility] set up at home. Per patient and wife, they have had [facility] services in the past and would like to re-start services. Patient nor patient's wife were able to remember the name of the [facility] agency. CSW consulted with RN CM regarding getting patient started with [facility] services. No further CSW needs at this time, please reconsult if needs arise.   Ollen Barges, [place] Work Department  [employer]  [phone]

## 2018-06-22 NOTE — ED Provider Notes (Signed)
[facility] DEPT Provider Note   CSN: [id number] Arrival date & time: 06/22/18  1112     History   Chief Complaint Chief Complaint  Patient presents with  . Weakness    HPI Lance [name] is a 84 y.o. male.  HPI Patient with recent diagnosis of urinary tract infection, now presents with fever, weakness, polyuria. Patient started antibiotics 2 days ago, Macrobid and fosfomycin. Prior to that he had about 1 week history of dysuria, fever. He now presents due to concern of persistent weakness, polyuria, urgency incontinence, and difficulty with functioning at home secondary to his symptoms. Last fever, objectively, was yesterday There is associated nausea, and last vomiting was yesterday as well. He is here with his wife who assists with the HPI. He denies new focal pain, new focal numbness, confusion, falling.  Past Medical History:  Diagnosis Date  . Alcohol abuse    Remote hx, stopped in 1970  . Anxiety   . BPH (benign prostatic hypertrophy) with urinary obstruction   . Colon polyps   . Depression   . Diverticulosis   . Glaucoma   . Headache    sinus headaches  . Hypercholesterolemia   . Hypertension   . Hypogonadism male   . OSA on CPAP   . Osteoarthritis   . Osteopenia   . Sleep apnea   . Vitamin D deficiency     Patient Active Problem List   Diagnosis Date Noted  . Pes anserinus bursitis of left knee 03/01/2017  . Primary osteoarthritis of right knee 02/15/2017  . Dizziness 10/28/2016  . 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 Osteoarthritis of left knee 02/25/2016  . S/P left unicompartmental knee replacement 02/25/2016  . Spinal stenosis at L4-L5 level 11/11/2015  . Aspiration pneumonia of left lower lobe due to gastric secretions ([place]) 10/22/2015  . Bilateral leg weakness 10/22/2015  . 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  . OSA treated with BiPAP 10/22/2015  . Ambulatory dysfunction  10/22/2015  . CVA (cerebral infarction) 06/03/2013  . Syncope 06/03/2013  . Facial laceration 06/03/2013  . Acute bronchitis 06/03/2013  . Hypertension     Past Surgical History:  Procedure Laterality Date  . APPENDECTOMY  1945  . BACK SURGERY    . EYE SURGERY     bilateral cataracts  . LUMBAR LAMINECTOMY/DECOMPRESSION MICRODISCECTOMY Bilateral 11/11/2015   Procedure: Bilateral L4-5 Laminectomy;  Surgeon: Kary Kos, MD;  Location: [facility];  Service: Neurosurgery;  Laterality: Bilateral;  Bilateral L4-5 Laminectomy  . PARTIAL KNEE ARTHROPLASTY Left 02/25/2016   Procedure: LEFT UNICOMPARTMENTAL KNEE ARTHROPLASTY;  Surgeon: Mcarthur Rossetti, MD;  Location: [facility];  Service: Orthopedics;  Laterality: Left;  . REPLACEMENT UNICONDYLAR JOINT KNEE Left 02/25/2016  . SHOULDER SURGERY Right   . TONSILLECTOMY          Home Medications    Prior to Admission medications   Medication Sig Start Date End Date Taking? Authorizing Provider  acetaminophen (TYLENOL) 325 MG tablet Take 650 mg by mouth every 6 (six) hours as needed for mild pain.   Yes [provider]  Ascorbic Acid (VITAMIN C PO) Take 1 tablet by mouth daily.   Yes [provider]  aspirin 325 MG tablet Take 325 mg by mouth at bedtime.    Yes [provider]  buPROPion (WELLBUTRIN SR) 150 MG 12 hr tablet Take 150 mg by mouth 2 (two) times daily.   Yes [provider]  CALCIUM CARBONATE PO Take  1 tablet by mouth daily.   Yes [provider]  Cyanocobalamin (VITAMIN B12 PO) Take 1 tablet by mouth daily.   Yes [provider]  diazepam (VALIUM) 10 MG tablet Take 10 mg by mouth See admin instructions. Take 1 tablet in the AM and 1 tablet in the PM. Take an additional 1 tablet during the day as needed for anxiety.   Yes [provider]  doxepin (SINEQUAN) 75 MG capsule Take 75 mg by mouth at bedtime.    Yes [provider]  ergocalciferol (VITAMIN D2) 50000  UNITS capsule Take 50,000 Units by mouth every Monday.    Yes [provider]  ezetimibe (ZETIA) 10 MG tablet Take 10 mg by mouth at bedtime.  09/29/16  Yes [provider]  finasteride (PROSCAR) 5 MG tablet Take 5 mg by mouth every morning. 12/07/17  Yes [provider]  fosfomycin (MONUROL) 3 g PACK Take 3 g by mouth See admin instructions. Take 1 packet three days apart for 2 doses total   Yes [provider]  losartan (COZAAR) 25 MG tablet Take 25 mg by mouth 2 (two) times daily. 05/13/18  Yes [provider]  meclizine (ANTIVERT) 25 MG tablet Take 25 mg by mouth See admin instructions. Takes 1 tablet every morning. Takes 1 tablet later in the day as needed for dizziness.   Yes [provider]  mirabegron ER (MYRBETRIQ) 25 MG TB24 tablet Take 25 mg by mouth daily.   Yes [provider]  Multiple Vitamin (MULTIVITAMIN) capsule Take 1 capsule by mouth daily.    Yes [provider]  nitrofurantoin (MACRODANTIN) 100 MG capsule Take 100 mg by mouth 2 (two) times daily.   Yes [provider]  Omega-3 Fatty Acids (FISH OIL PO) Take 1 capsule by mouth daily.   Yes [provider]  Probiotic Product ([place]) Take 1 capsule by mouth daily.   Yes [provider]  tamsulosin (FLOMAX) 0.4 MG CAPS capsule Take 0.4 mg by mouth at bedtime.  12/07/17  Yes [provider]  timolol (TIMOPTIC) 0.5 % ophthalmic solution Place 1 drop into both eyes daily. 05/19/18  Yes [provider]  traMADol (ULTRAM) 50 MG tablet Take 50-100 mg by mouth every 8 (eight) hours as needed for pain. 06/12/18  Yes [provider]    Family History Family History  Problem Relation Age of Onset  . Stroke Neg Hx     Social History Social History   Tobacco Use  . Smoking status: Former Smoker    Last attempt to quit: 09/28/1962    Years since quitting: 55.7  . Smokeless tobacco: Never Used    Substance Use Topics  . Alcohol use: No  . Drug use: No     Allergies   Penicillins and Simvastatin   Review of Systems Review of Systems  Constitutional:       Per HPI, otherwise negative  HENT:       Per HPI, otherwise negative  Respiratory:       Per HPI, otherwise negative  Cardiovascular:       Per HPI, otherwise negative  Gastrointestinal: Negative for vomiting.  Endocrine:       Negative aside from HPI  Genitourinary:       Neg aside from HPI   Musculoskeletal:       Per HPI, otherwise negative  Skin: Negative.   Neurological: Positive for weakness. Negative for syncope.  Physical Exam Updated Vital Signs BP (!) 148/73   Pulse 82   Temp (!) 100.6 F (38.1 C) (Oral)   Resp 18   Ht 6' (1.829 m)   Wt 95.3 kg   SpO2 92%   BMI 28.48 kg/m   Physical Exam  Constitutional: He is oriented to person, place, and time. He has a sickly appearance. No distress.  HENT:  Head: Normocephalic and atraumatic.  Eyes: Conjunctivae and EOM are normal.  Cardiovascular: Normal rate and regular rhythm.  Pulmonary/Chest: Effort normal. No stridor. No respiratory distress.  Abdominal: He exhibits no distension. There is no tenderness.  Musculoskeletal: He exhibits no edema.  Neurological: He is alert and oriented to person, place, and time.  Skin: Skin is warm and dry.  Psychiatric: He has a normal mood and affect.  Nursing note and vitals reviewed.    ED Treatments / Results  Labs (all labs ordered are listed, but only abnormal results are displayed) Labs Reviewed  COMPREHENSIVE METABOLIC PANEL - Abnormal; Notable for the following components:      Result Value   Glucose, Bld 118 (*)    Calcium 8.5 (*)    Albumin 3.2 (*)    All other components within normal limits  CBC WITH DIFFERENTIAL/PLATELET - Abnormal; Notable for the following components:   WBC 18.0 (*)    RBC 3.80 (*)    Hemoglobin 12.1 (*)    HCT 35.8 (*)    Neutro Abs 15.3 (*)    Monocytes  Absolute 1.9 (*)    All other components within normal limits  URINALYSIS, ROUTINE W REFLEX MICROSCOPIC - Abnormal; Notable for the following components:   Hgb urine dipstick LARGE (*)    Ketones, ur 5 (*)    Leukocytes, UA TRACE (*)    RBC / HPF >50 (*)    All other components within normal limits  I-STAT CG4 LACTIC ACID, ED    Procedures Procedures (including critical care time)  Medications Ordered in ED Medications  acetaminophen (TYLENOL) tablet 1,000 mg (1,000 mg Oral Given 06/22/18 1251)     Initial Impression / Assessment and Plan / ED Course  I have reviewed the triage vital signs and the nursing notes.  Pertinent labs & imaging results that were available during my care of the patient were reviewed by me and considered in my medical decision making (see chart for details).     Discussed patient's case with case management/social work team, who assisted with home health arrangements.    4:59 PM On repeat exam patient is awake, alert, in no distress, hemodynamically unremarkable, smiling. We discussed all findings including generally reassuring labs per Patient has a mild leukocytosis, urinalysis improved from a recent study. Patient is currently taking Macrobid and fosfomycin, with no evidence for bacteremia, sepsis, is appropriate for continued outpatient therapy.  I made a lengthy conversation about return precautions, and follow-up instructions. Patient has been seen by social work, and with assistance with home health arrangement.  Final Clinical Impressions(s) / ED Diagnoses  Urinary tract infection   Carmin Muskrat, MD 06/22/18 1700

## 2018-06-22 NOTE — ED Notes (Signed)
Bed: OI71 Expected date:  Expected time:  Means of arrival:  Comments: EMS 86 UTI

## 2018-06-22 NOTE — ED Triage Notes (Signed)
Pt arrives via EMS from home. Pt has had generalized weakness for 1 week. Pt dx with UTI yesterday by PCP. Pts has had generalized weakness that continues. Pt and family requesting further evaluation.

## 2018-07-30 ENCOUNTER — Encounter: Payer: Self-pay | Admitting: Adult Health

## 2018-08-01 ENCOUNTER — Ambulatory Visit: Payer: Medicare Other | Admitting: Adult Health

## 2018-08-01 ENCOUNTER — Encounter: Payer: Self-pay | Admitting: Adult Health

## 2018-08-01 VITALS — BP 135/78 | HR 68 | Ht 72.0 in | Wt 214.8 lb

## 2018-08-01 DIAGNOSIS — Z9989 Dependence on other enabling machines and devices: Secondary | ICD-10-CM | POA: Diagnosis not present

## 2018-08-01 DIAGNOSIS — G4733 Obstructive sleep apnea (adult) (pediatric): Secondary | ICD-10-CM | POA: Diagnosis not present

## 2018-08-01 NOTE — Progress Notes (Signed)
Community message sent via Epic to [employer], [facility], re: CPAP order.

## 2018-08-01 NOTE — Patient Instructions (Signed)
Your Plan:  Continue using CPAP nightly  Change pressure to 10-18 cm H20  We will check download in 1 month- call office  Please call [facility] at [phone], extension [phone] to discuss your concerns. Their customer service representatives will be glad to assist you. If they do not answer, please leave a message and they will call you back. Make sure to leave your name and return phone number. If you do not get a response from them in 3 business days, please let our office know.   Thank you for coming to see [place] at [facility]. I hope we have been able to provide you high quality care today.  You may receive a patient satisfaction survey over the next few weeks. We would appreciate your feedback and comments so that we may continue to improve ourselves and the health of our patients.

## 2018-08-01 NOTE — Progress Notes (Signed)
PATIENT: Lance [name] DOB: [birth year]  REASON FOR VISIT: follow up HISTORY FROM: patient  HISTORY OF PRESENT ILLNESS: Today 08/01/18:  Lance [name] is an 84 year old male with a history of obstructive sleep apnea on CPAP.  His CPAP download indicates that he use the machine 28 out of 30 days for compliance of 93%.  He uses machine greater than 4 hours 25 days for compliance of 83%.  On average he uses his machine 7 hours and 8 minutes.  His residual AHI is 11.8 on 15 cm of water.  His leak in the 95th percentile is 24 L/min.  He does voice that there are some nights that the mask comes off his face while he is sleeping.  The Epworth sleepiness score is 13.  The patient reports benefit with using the CPAP.  His residual AHI has increased slightly since his pressure was increased to 15.  Patient denies any significant changes with the memory.  He returns today for evaluation.  HISTORY 01/26/18 , is I have the pleasure of seeing Lance [name] and his wife today and they return visit, the patient recently was seen in the hospital for an aspiration he had swallowed a pill which then blocked part of his windpipe or bronchial tree.  He had an x-ray followed by a CT of the chest which showed that the pill has been well resolved.  He was able to cough up at least part of the medication.  Otherwise his medication is unchanged the patient has been updated on his vaccinations, he has a pneumonia shot he also has usually an influenza vaccination yearly.  We are meeting today to follow-up on his 2 main concerns one is a CPAP compliance another one is a memory test.  Today's MMSE was 26/ 30 .  The patient's compliance for CPAP has been excellent he has used the machine 29 out of 30 days and on 23 days over 4 hours with a compliance of 77%.  Average daily use a time is 6 hours and 27 minutes.  CPAP is currently set at 13 cm pressure was 1 cm EPR but the patient has a lot of residual obstructive  apneas.  Current AHI was 14 and he had 38 minutes of Cheyne-Stokes respiration.  For this reason I will increase his CPAP pressure to 15 cm and allow for 3 cm EPR expiratory pressure relief he also struggles with some air leakage still air passing by the mask. He uses an airfit foam mask, would like to be refitted. I recommend a so clean machine.    REVIEW OF SYSTEMS: Out of a complete 14 system review of symptoms, the patient complains only of the following symptoms, and all other reviewed systems are negative.  Joint pain, back pain, walking difficulty, neck pain    ALLERGIES: Allergies  Allergen Reactions  . Penicillins Anaphylaxis, Hives and Other (See Comments)    Has patient had a PCN reaction causing immediate rash, facial/tongue/throat swelling, SOB or lightheadedness with hypotension: Yes Has patient had a PCN reaction causing severe rash involving mucus membranes or skin necrosis: No Has patient had a PCN reaction that required hospitalization Yes Has patient had a PCN reaction occurring within the last 10 years: No If all of the above answers are "NO", then may proceed with Cephalosporin use.   . Simvastatin Other (See Comments)    Leg weakness    HOME MEDICATIONS: Outpatient Medications Prior to Visit  Medication Sig Dispense Refill  . acetaminophen (  TYLENOL) 325 MG tablet Take 650 mg by mouth every 6 (six) hours as needed for mild pain.    . Ascorbic Acid (VITAMIN C PO) Take 1 tablet by mouth daily.    Marland Kitchen aspirin 325 MG tablet Take 325 mg by mouth at bedtime.     Marland Kitchen buPROPion (WELLBUTRIN SR) 150 MG 12 hr tablet Take 150 mg by mouth 2 (two) times daily.    Marland Kitchen CALCIUM CARBONATE PO Take 1 tablet by mouth daily.    . Cyanocobalamin (VITAMIN B12 PO) Take 1 tablet by mouth daily.    . diazepam (VALIUM) 10 MG tablet Take 10 mg by mouth See admin instructions. Take 1 tablet in the AM and 1 tablet in the PM. Take an additional 1 tablet during the day as needed for anxiety.    Marland Kitchen  doxepin (SINEQUAN) 75 MG capsule Take 75 mg by mouth at bedtime.     . ergocalciferol (VITAMIN D2) 50000 UNITS capsule Take 50,000 Units by mouth every Monday.     . ezetimibe (ZETIA) 10 MG tablet Take 10 mg by mouth at bedtime.   11  . finasteride (PROSCAR) 5 MG tablet Take 5 mg by mouth every morning.  3  . losartan (COZAAR) 25 MG tablet Take 25 mg by mouth 2 (two) times daily.  11  . Multiple Vitamin (MULTIVITAMIN) capsule Take 1 capsule by mouth daily.     . Omega-3 Fatty Acids (FISH OIL PO) Take 1 capsule by mouth daily.    . Probiotic Product (PHILLIPS COLON HEALTH PO) Take 1 capsule by mouth daily.    . tamsulosin (FLOMAX) 0.4 MG CAPS capsule Take 0.4 mg by mouth at bedtime.   3  . timolol (TIMOPTIC) 0.5 % ophthalmic solution Place 1 drop into both eyes daily.  12  . traMADol (ULTRAM) 50 MG tablet Take 50-100 mg by mouth every 8 (eight) hours as needed for pain.  4  . mirabegron ER (MYRBETRIQ) 25 MG TB24 tablet Take 25 mg by mouth daily.    . nitrofurantoin (MACRODANTIN) 100 MG capsule Take 100 mg by mouth 2 (two) times daily.    . fosfomycin (MONUROL) 3 g PACK Take 3 g by mouth See admin instructions. Take 1 packet three days apart for 2 doses total    . meclizine (ANTIVERT) 25 MG tablet Take 25 mg by mouth See admin instructions. Takes 1 tablet every morning. Takes 1 tablet later in the day as needed for dizziness.     No facility-administered medications prior to visit.     PAST MEDICAL HISTORY: Past Medical History:  Diagnosis Date  . Alcohol abuse    Remote hx, stopped in 1970  . Anxiety   . BPH (benign prostatic hypertrophy) with urinary obstruction   . Colon polyps   . Depression   . Diverticulosis   . Glaucoma   . Headache    sinus headaches  . Hypercholesterolemia   . Hypertension   . Hypogonadism male   . OSA on CPAP   . Osteoarthritis   . Osteopenia   . Sleep apnea   . Vitamin D deficiency     PAST SURGICAL HISTORY: Past Surgical History:  Procedure  Laterality Date  . APPENDECTOMY  1945  . BACK SURGERY    . EYE SURGERY     bilateral cataracts  . LUMBAR LAMINECTOMY/DECOMPRESSION MICRODISCECTOMY Bilateral 11/11/2015   Procedure: Bilateral L4-5 Laminectomy;  Surgeon: Kary Kos, MD;  Location: [place] [facility];  Service: Neurosurgery;  Laterality: Bilateral;  Bilateral L4-5 Laminectomy  . PARTIAL KNEE ARTHROPLASTY Left 02/25/2016   Procedure: LEFT UNICOMPARTMENTAL KNEE ARTHROPLASTY;  Surgeon: Mcarthur Rossetti, MD;  Location: [place];  Service: Orthopedics;  Laterality: Left;  . REPLACEMENT UNICONDYLAR JOINT KNEE Left 02/25/2016  . SHOULDER SURGERY Right   . TONSILLECTOMY      FAMILY HISTORY: Family History  Problem Relation Age of Onset  . Stroke Neg Hx     SOCIAL HISTORY: Social History   Socioeconomic History  . Marital status: Married    Spouse name: Jan  . Number of children: 3  . Years of education: Not on file  . Highest education level: Not on file  Occupational History  . Occupation: retired    Fish farm manager: RETIRED  Social Needs  . Financial resource strain: Not on file  . Food insecurity:    Worry: Not on file    Inability: Not on file  . Transportation needs:    Medical: Not on file    Non-medical: Not on file  Tobacco Use  . Smoking status: Former Smoker    Last attempt to quit: 09/28/1962    Years since quitting: 55.8  . Smokeless tobacco: Never Used  Substance and Sexual Activity  . Alcohol use: No  . Drug use: No  . Sexual activity: Not on file  Lifestyle  . Physical activity:    Days per week: Not on file    Minutes per session: Not on file  . Stress: Not on file  Relationships  . Social connections:    Talks on phone: Not on file    Gets together: Not on file    Attends religious service: Not on file    Active member of club or organization: Not on file    Attends meetings of clubs or organizations: Not on file    Relationship status: Not on file  . Intimate partner violence:    Fear of current  or ex partner: Not on file    Emotionally abused: Not on file    Physically abused: Not on file    Forced sexual activity: Not on file  Other Topics Concern  . Not on file  Social History Narrative   Lives with wife      PHYSICAL EXAM  Vitals:   08/01/18 1241  BP: 135/78  Pulse: 68  Weight: 214 lb 12.8 oz (97.4 kg)  Height: 6' (1.829 m)   Body mass index is 29.13 kg/m.  Generalized: Well developed, in no acute distress   Neurological examination  Mentation: Alert oriented to time, place, history taking. Follows all commands speech and language fluent Cranial nerve II-XII: Pupils were equal round reactive to light. Extraocular movements were full, visual field were full on confrontational test. Facial sensation and strength were normal. Uvula tongue midline. Head turning and shoulder shrug  were normal and symmetric.  Neck circumference 17 inches, Mallampati 3+ motor: The motor testing reveals 5 over 5 strength of all 4 extremities. Good symmetric motor tone is noted throughout.  Sensory: Sensory testing is intact to soft touch on all 4 extremities. No evidence of extinction is noted.  Coordination: Cerebellar testing reveals good finger-nose-finger and heel-to-shin bilaterally.  Gait and station: Patient has a slight limp when ambulating.  He uses a cane.  Tandem gait not attempted. Reflexes: Deep tendon reflexes are symmetric and normal bilaterally.   DIAGNOSTIC DATA (LABS, IMAGING, TESTING) - I reviewed patient records, labs, notes, testing and imaging myself where available.  Lab Results  Component  Value Date   WBC 18.0 (H) 06/22/2018   HGB 12.1 (L) 06/22/2018   HCT 35.8 (L) 06/22/2018   MCV 94.2 06/22/2018   PLT 232 06/22/2018      Component Value Date/Time   NA 136 06/22/2018 1250   K 4.0 06/22/2018 1250   CL 100 06/22/2018 1250   CO2 29 06/22/2018 1250   GLUCOSE 118 (H) 06/22/2018 1250   BUN 9 06/22/2018 1250   CREATININE 0.91 06/22/2018 1250   CALCIUM 8.5  (L) 06/22/2018 1250   PROT 6.5 06/22/2018 1250   ALBUMIN 3.2 (L) 06/22/2018 1250   AST 17 06/22/2018 1250   ALT 17 06/22/2018 1250   ALKPHOS 55 06/22/2018 1250   BILITOT 1.1 06/22/2018 1250   GFRNONAA >60 06/22/2018 1250   GFRAA >60 06/22/2018 1250   Lab Results  Component Value Date   CHOL 101 06/04/2013   HDL 29 (L) 06/04/2013   LDLCALC 45 06/04/2013   TRIG 135 06/04/2013   CHOLHDL 3.5 06/04/2013   Lab Results  Component Value Date   HGBA1C 5.4 06/04/2013   Lab Results  Component Value Date   WUJWJXBJ47 829 (H) 04/03/2011   Lab Results  Component Value Date   TSH 0.312 (L) 06/03/2013      ASSESSMENT AND PLAN 84 y.o. year old male  has a past medical history of Alcohol abuse, Anxiety, BPH (benign prostatic hypertrophy) with urinary obstruction, Colon polyps, Depression, Diverticulosis, Glaucoma, Headache, Hypercholesterolemia, Hypertension, Hypogonadism male, OSA on CPAP, Osteoarthritis, Osteopenia, Sleep apnea, and Vitamin D deficiency. here with:  1.  Obstructive sleep apnea on CPAP  The patient CPAP download indicates that he has excellent compliance.  His residual AHI is elevated.  I will change his pressure to AutoSet fluctuating between 10 and 18 cm of water.  If the patient is unable to tolerate this change he will let us know.  They will call in 1 month and I will get another download to see if the change in pressure has offered any benefit.  Advised that if his symptoms worsen or he develops new symptoms he should let us know.  He will follow-up in 6 months or sooner if needed.   I spent 15 minutes with the patient. 50% of this time was spent reviewing CPAP download   Ward Givens, MSN, NP-C 08/01/2018, 1:05 PM [facility] [address] [place], [place] [address] [phone]

## 2019-01-30 ENCOUNTER — Ambulatory Visit: Payer: Medicare Other | Admitting: Neurology

## 2019-10-24 ENCOUNTER — Encounter: Payer: Self-pay | Admitting: Internal Medicine

## 2019-11-21 ENCOUNTER — Ambulatory Visit: Payer: Medicare Other | Admitting: Internal Medicine

## 2019-11-21 ENCOUNTER — Other Ambulatory Visit: Payer: Self-pay

## 2019-11-21 ENCOUNTER — Encounter: Payer: Self-pay | Admitting: Internal Medicine

## 2019-11-21 ENCOUNTER — Ambulatory Visit: Payer: Self-pay | Admitting: Internal Medicine

## 2019-11-21 VITALS — BP 122/64 | HR 68 | Temp 97.8°F | Ht 71.75 in | Wt 206.6 lb

## 2019-11-21 DIAGNOSIS — Z86010 Personal history of colonic polyps: Secondary | ICD-10-CM

## 2019-11-21 DIAGNOSIS — R195 Other fecal abnormalities: Secondary | ICD-10-CM | POA: Diagnosis not present

## 2019-11-21 DIAGNOSIS — K5909 Other constipation: Secondary | ICD-10-CM | POA: Diagnosis not present

## 2019-11-21 NOTE — Patient Instructions (Signed)
Please follow up as needed 

## 2019-11-21 NOTE — Progress Notes (Signed)
HISTORY OF PRESENT ILLNESS:  Lance [name] is a 86 y.o. male, retired Advice worker, with past medical history as listed below who was sent today by his primary care provider regarding Hemoccult positive stool.  He is accompanied by his wife.  Patient has been followed in this office over the years for surveillance colonoscopy due to history of adenomatous colon polyps.  Previous examinations were performed in 2002, 2005, 2008, and most recently February 2013.  At the time of his last colonoscopy he was found to have a diminutive transverse colon polyp (adenoma) which was removed.  Moderate left-sided diverticulosis noted.  No other abnormalities.  Due to his age, no future surveillance examinations were recommended.  Patient tells me that as part of his annual wellness exam he underwent routine Hemoccult testing.  I have obtained outside records.  On September 20, 2019, this was positive.  The study was again repeated x3 October 19, 2019.  All 3 samples were positive.  GI referral made.  Review of outside blood work from September 12, 2019 shows a normal hemoglobin of 13.9.  This compares favorably to hemoglobin of 12.1 in September 2019.  Comprehensive metabolic panel was normal.  Patient denies melena or hematochezia.  No abdominal pain.  No weight loss.  He does have chronic stable constipation for which he takes stool softeners.  No family history of colon cancer.  Overall, except for arthritic knees for which she uses a walker, he enjoys good health.  REVIEW OF SYSTEMS:  All non-GI ROS negative unless otherwise stated in the HPI except for sinus allergy, anxiety, arthritis, back pain, visual change, depression, fatigue, headaches, hearing problems, sleeping problems, swollen ankles, urinary leakage  Past Medical History:  Diagnosis Date  . Alcohol abuse    Remote hx, stopped in 1970  . Anxiety   . BPH (benign prostatic hypertrophy) with urinary obstruction   . Colon polyps   .  Depression   . Diverticulosis   . Glaucoma   . Headache    sinus headaches  . Hypercholesterolemia   . Hypertension   . Hypogonadism male   . OSA on CPAP   . Osteoarthritis   . Osteopenia   . Sleep apnea   . Vitamin D deficiency     Past Surgical History:  Procedure Laterality Date  . APPENDECTOMY  1945  . BACK SURGERY    . EYE SURGERY     bilateral cataracts  . LUMBAR LAMINECTOMY/DECOMPRESSION MICRODISCECTOMY Bilateral 11/11/2015   Procedure: Bilateral L4-5 Laminectomy;  Surgeon: Kary Kos, MD;  Location: [place] [facility];  Service: Neurosurgery;  Laterality: Bilateral;  Bilateral L4-5 Laminectomy  . PARTIAL KNEE ARTHROPLASTY Left 02/25/2016   Procedure: LEFT UNICOMPARTMENTAL KNEE ARTHROPLASTY;  Surgeon: Mcarthur Rossetti, MD;  Location: [place];  Service: Orthopedics;  Laterality: Left;  . REPLACEMENT UNICONDYLAR JOINT KNEE Left 02/25/2016  . SHOULDER SURGERY Right   . TONSILLECTOMY      Social History Lance [name]  reports that he quit smoking about 57 years ago. He has never used smokeless tobacco. He reports previous alcohol use. He reports that he does not use drugs.  family history includes Colon polyps in his daughter, son, and son.  Allergies  Allergen Reactions  . Penicillins Anaphylaxis, Hives and Other (See Comments)    Has patient had a PCN reaction causing immediate rash, facial/tongue/throat swelling, SOB or lightheadedness with hypotension: Yes Has patient had a PCN reaction causing severe rash involving mucus membranes or skin necrosis: No Has patient had  a PCN reaction that required hospitalization Yes Has patient had a PCN reaction occurring within the last 10 years: No If all of the above answers are "NO", then may proceed with Cephalosporin use.   . Simvastatin Other (See Comments)    Leg weakness       PHYSICAL EXAMINATION: Vital signs: BP 122/64   Pulse 68   Temp 97.8 F (36.6 C)   Ht 5' 11.75" (1.822 m)   Wt 206 lb 9.6 oz (93.7 kg)    BMI 28.22 kg/m   Constitutional: Pleasant, generally well-appearing, no acute distress Psychiatric: alert and oriented x3, cooperative Eyes: extraocular movements intact, anicteric, conjunctiva pink Mouth: oral pharynx moist, no lesions Neck: supple no lymphadenopathy Cardiovascular: heart regular rate and rhythm, no murmur Lungs: clear to auscultation bilaterally Abdomen: soft, nontender, nondistended, no obvious ascites, no peritoneal signs, normal bowel sounds, no organomegaly Rectal: Omitted Extremities: no clubbing, cyanosis, or lower extremity edema bilaterally Skin: no lesions on visible extremities Neuro: No focal deficits.  Cranial nerves intact  ASSESSMENT:  1.  Hemoccult positive stool, repeatedly.  Obtained as part of a wellness exam and the patient without referable symptoms who has completed a colonoscopy surveillance program.  Normal hemoglobin.  Asymptomatic.  Clinical significance uncertain. 2.  History of adenomatous colon polyps.  Multiple prior colonoscopies as described.  Last examination 2013 3.  Chronic stable constipation.  Continue stool softeners 4.  Advanced age   PLAN:  14.  I discussed with the patient and his wife the implications of Hemoccult positive stool.  We discussed this in detail with regards to sensitivity and specificity for colon cancer.  We discussed the pros and cons of moving forward with colonoscopy.  I was willing to offer him colonoscopy given his overall favorable health status for his age.  However, after a balanced and detailed discussion, answering multiple medical questions from the patient and his wife, they have elected to forego colonoscopy at this time.  They understand that they are willing to think about things further and alter their decision if they wish.  I support either decision this particular patient.  I will send a copy of this correspondence to Dr. Joylene Draft.  A total time of 45 minutes was spent preparing to see the patient,  reviewing outside test results, prior endoscopic examinations and pathology, obtaining a detailed history, performing a comprehensive medical examination, counseling the patient and his wife regarding the principal issue at hand, reviewing proposed procedure, and documenting clinical information in the health record.

## 2019-12-07 ENCOUNTER — Telehealth: Payer: Self-pay | Admitting: Physician Assistant

## 2019-12-07 NOTE — Telephone Encounter (Signed)
Talked to patient and I believe she is talking about [employer] braces?

## 2019-12-07 NOTE — Telephone Encounter (Signed)
Please advise 

## 2019-12-07 NOTE — Telephone Encounter (Signed)
Wife aware that we have spoke with Ryan/Donjoy and he will get ahold of patient to get him fitted for brace

## 2019-12-07 NOTE — Telephone Encounter (Signed)
Hinged knee brace ? Already got a a pullover sleeve.

## 2019-12-07 NOTE — Telephone Encounter (Signed)
Patient's wife called  She wanted a call back to discuss the possibility of the patient being given a brace.   Call back: [phone]  Cell: [phone]

## 2019-12-07 NOTE — Telephone Encounter (Signed)
That is fine 

## 2019-12-28 ENCOUNTER — Other Ambulatory Visit: Payer: Self-pay

## 2019-12-28 ENCOUNTER — Ambulatory Visit: Payer: Medicare Other | Admitting: Adult Health

## 2019-12-28 ENCOUNTER — Encounter: Payer: Self-pay | Admitting: Adult Health

## 2019-12-28 VITALS — BP 171/87 | HR 72 | Temp 97.3°F | Ht 72.0 in | Wt 214.6 lb

## 2019-12-28 DIAGNOSIS — Z9989 Dependence on other enabling machines and devices: Secondary | ICD-10-CM

## 2019-12-28 DIAGNOSIS — G4733 Obstructive sleep apnea (adult) (pediatric): Secondary | ICD-10-CM | POA: Diagnosis not present

## 2019-12-28 NOTE — Patient Instructions (Signed)
Continue using CPAP nightly and greater than 4 hours each night °If your symptoms worsen or you develop new symptoms please let us know.  ° °

## 2019-12-28 NOTE — Progress Notes (Signed)
PATIENT: Lance [name] DOB: 1932-01-28  REASON FOR VISIT: follow up HISTORY FROM: patient  HISTORY OF PRESENT ILLNESS: Today 12/28/19:  Mr. Lance [name] is an 86 year old male with a history of obstructive sleep apnea on CPAP.  His download indicates that he uses machine 27 out of 30 days for compliance of 90%.  He uses machine greater than 4 hours 15 days for compliance of 50%.  On average he uses his machine 4 hours and 24 minutes.  His residual AHI is 3.7 on 10 to 18 cm of water with EPR of 1.  Leak in the 95th percentile is 28.8 L/min.  Reports that the CPAP is working well for him now that he has fixed his mask.  States that he was having a hard time using it because of the mask leaking.  HISTORY 08/01/18:  Mr. Lance [name] is an 86 year old male with a history of obstructive sleep apnea on CPAP.  His CPAP download indicates that he use the machine 28 out of 30 days for compliance of 93%.  He uses machine greater than 4 hours 25 days for compliance of 83%.  On average he uses his machine 7 hours and 8 minutes.  His residual AHI is 11.8 on 15 cm of water.  His leak in the 95th percentile is 24 L/min.  He does voice that there are some nights that the mask comes off his face while he is sleeping.  The Epworth sleepiness score is 13.  The patient reports benefit with using the CPAP.  His residual AHI has increased slightly since his pressure was increased to 15.  Patient denies any significant changes with the memory.  He returns today for evaluation.  REVIEW OF SYSTEMS: Out of a complete 14 system review of symptoms, the patient complains only of the following symptoms, and all other reviewed systems are negative.  See hpi  ALLERGIES: Allergies  Allergen Reactions  . Penicillins Anaphylaxis, Hives and Other (See Comments)    Has patient had a PCN reaction causing immediate rash, facial/tongue/throat swelling, SOB or lightheadedness with hypotension: Yes Has patient had a PCN reaction  causing severe rash involving mucus membranes or skin necrosis: No Has patient had a PCN reaction that required hospitalization Yes Has patient had a PCN reaction occurring within the last 10 years: No If all of the above answers are "NO", then may proceed with Cephalosporin use.   . Simvastatin Other (See Comments)    Leg weakness    HOME MEDICATIONS: Outpatient Medications Prior to Visit  Medication Sig Dispense Refill  . Ascorbic Acid (VITAMIN C PO) Take 1 tablet by mouth daily.    Marland Kitchen aspirin 325 MG EC tablet Take 325 mg by mouth daily.    Marland Kitchen buPROPion (WELLBUTRIN SR) 150 MG 12 hr tablet Take 150 mg by mouth 2 (two) times daily.    Marland Kitchen CALCIUM CARBONATE PO Take 1 tablet by mouth daily.    . Cyanocobalamin (VITAMIN B12 PO) Take 1 tablet by mouth daily.    . diazepam (VALIUM) 10 MG tablet Take 10 mg by mouth every 8 (eight) hours as needed for anxiety.    Marland Kitchen doxepin (SINEQUAN) 75 MG capsule Take 75 mg by mouth at bedtime.    Marland Kitchen ezetimibe (ZETIA) 10 MG tablet Take 10 mg by mouth daily.    . finasteride (PROSCAR) 5 MG tablet Take 5 mg by mouth daily.    . mirabegron ER (MYRBETRIQ) 50 MG TB24 tablet Take 50 mg by mouth daily.    Marland Kitchen  Multiple Vitamin (MULTIVITAMIN) capsule Take 1 capsule by mouth daily.     . Omega-3 Fatty Acids (FISH OIL PO) Take 1 capsule by mouth daily.    . Probiotic Product (PHILLIPS COLON HEALTH PO) Take 1 capsule by mouth daily.    . traMADol (ULTRAM) 50 MG tablet Take by mouth every 8 (eight) hours as needed.    . valsartan (DIOVAN) 40 MG tablet Take 40 mg by mouth 2 (two) times daily.     No facility-administered medications prior to visit.    PAST MEDICAL HISTORY: Past Medical History:  Diagnosis Date  . Alcohol abuse    Remote hx, stopped in 1970  . Anxiety   . BPH (benign prostatic hypertrophy) with urinary obstruction   . Colon polyps   . Depression   . Diverticulosis   . Glaucoma   . Headache    sinus headaches  . Hypercholesterolemia   . Hypertension    . Hypogonadism male   . OSA on CPAP   . Osteoarthritis   . Osteopenia   . Sleep apnea   . Vitamin D deficiency     PAST SURGICAL HISTORY: Past Surgical History:  Procedure Laterality Date  . APPENDECTOMY  1945  . BACK SURGERY    . EYE SURGERY     bilateral cataracts  . LUMBAR LAMINECTOMY/DECOMPRESSION MICRODISCECTOMY Bilateral 11/11/2015   Procedure: Bilateral L4-5 Laminectomy;  Surgeon: Kary Kos, MD;  Location: [facility];  Service: Neurosurgery;  Laterality: Bilateral;  Bilateral L4-5 Laminectomy  . PARTIAL KNEE ARTHROPLASTY Left 02/25/2016   Procedure: LEFT UNICOMPARTMENTAL KNEE ARTHROPLASTY;  Surgeon: Mcarthur Rossetti, MD;  Location: [facility];  Service: Orthopedics;  Laterality: Left;  . REPLACEMENT UNICONDYLAR JOINT KNEE Left 02/25/2016  . SHOULDER SURGERY Right   . TONSILLECTOMY      FAMILY HISTORY: Family History  Problem Relation Age of Onset  . Colon polyps Son   . Colon polyps Daughter   . Colon polyps Son   . Stroke Neg Hx   . Colon cancer Neg Hx     SOCIAL HISTORY: Social History   Socioeconomic History  . Marital status: Married    Spouse name: Jan  . Number of children: 3  . Years of education: Not on file  . Highest education level: Not on file  Occupational History  . Occupation: retired    Fish farm manager: RETIRED  Tobacco Use  . Smoking status: Former Smoker    Quit date: 09/28/1962    Years since quitting: 57.2  . Smokeless tobacco: Never Used  Substance and Sexual Activity  . Alcohol use: Not Currently  . Drug use: No  . Sexual activity: Not on file  Other Topics Concern  . Not on file  Social History Narrative   Lives with wife   Social Determinants of Health   Financial Resource Strain:   . Difficulty of Paying Living Expenses:   Food Insecurity:   . Worried About Charity fundraiser in the Last Year:   . Arboriculturist in the Last Year:   Transportation Needs:   . Film/video editor (Medical):   Marland Kitchen Lack of Transportation  (Non-Medical):   Physical Activity:   . Days of Exercise per Week:   . Minutes of Exercise per Session:   Stress:   . Feeling of Stress :   Social Connections:   . Frequency of Communication with Friends and Family:   . Frequency of Social Gatherings with Friends and Family:   .  Attends Religious Services:   . Active Member of Clubs or Organizations:   . Attends Archivist Meetings:   Marland Kitchen Marital Status:   Intimate Partner Violence:   . Fear of Current or Ex-Partner:   . Emotionally Abused:   Marland Kitchen Physically Abused:   . Sexually Abused:       PHYSICAL EXAM  Vitals:   12/28/19 1410  BP: (!) 171/87  Pulse: 72  Temp: (!) 97.3 F (36.3 C)  Weight: 214 lb 9.6 oz (97.3 kg)  Height: 6' (1.829 m)   Body mass index is 29.1 kg/m.  Generalized: Well developed, in no acute distress  Chest: Lungs clear to auscultation bilaterally  Neurological examination  Mentation: Alert oriented to time, place, history taking. Follows all commands speech and language fluent Cranial nerve II-XII: Extraocular movements were full, visual field were full on confrontational test Head turning and shoulder shrug  were normal and symmetric. Motor: The motor testing reveals 5 over 5 strength of all 4 extremities. Good symmetric motor tone is noted throughout.  Sensory: Sensory testing is intact to soft touch on all 4 extremities. No evidence of extinction is noted.  Gait and station: Gait is normal.    DIAGNOSTIC DATA (LABS, IMAGING, TESTING) - I reviewed patient records, labs, notes, testing and imaging myself where available.  Lab Results  Component Value Date   WBC 18.0 (H) 06/22/2018   HGB 12.1 (L) 06/22/2018   HCT 35.8 (L) 06/22/2018   MCV 94.2 06/22/2018   PLT 232 06/22/2018      Component Value Date/Time   NA 136 06/22/2018 1250   K 4.0 06/22/2018 1250   CL 100 06/22/2018 1250   CO2 29 06/22/2018 1250   GLUCOSE 118 (H) 06/22/2018 1250   BUN 9 06/22/2018 1250   CREATININE  0.91 06/22/2018 1250   CALCIUM 8.5 (L) 06/22/2018 1250   PROT 6.5 06/22/2018 1250   ALBUMIN 3.2 (L) 06/22/2018 1250   AST 17 06/22/2018 1250   ALT 17 06/22/2018 1250   ALKPHOS 55 06/22/2018 1250   BILITOT 1.1 06/22/2018 1250   GFRNONAA >60 06/22/2018 1250   GFRAA >60 06/22/2018 1250   Lab Results  Component Value Date   CHOL 101 06/04/2013   HDL 29 (L) 06/04/2013   LDLCALC 45 06/04/2013   TRIG 135 06/04/2013   CHOLHDL 3.5 06/04/2013   Lab Results  Component Value Date   HGBA1C 5.4 06/04/2013   Lab Results  Component Value Date   [id number] (H) 04/03/2011   Lab Results  Component Value Date   TSH 0.312 (L) 06/03/2013      ASSESSMENT AND PLAN 86 y.o. year old male  has a past medical history of Alcohol abuse, Anxiety, BPH (benign prostatic hypertrophy) with urinary obstruction, Colon polyps, Depression, Diverticulosis, Glaucoma, Headache, Hypercholesterolemia, Hypertension, Hypogonadism male, OSA on CPAP, Osteoarthritis, Osteopenia, Sleep apnea, and Vitamin D deficiency. here with:  1. OSA on CPAP  - CPAP compliance is suboptimal - Good treatment of AHI  - Encourage patient to use CPAP nightly and > 4 hours each night - F/U in 1 year or sooner if needed   I spent 25 minutes of face-to-face and non-face-to-face time with patient.  This included previsit chart review, lab review, study review, order entry, electronic health record documentation, patient education.  Ward Givens, MSN, NP-C 12/28/2019, 2:30 PM [facility] [address] [place], [place] [address] [phone]

## 2020-01-08 ENCOUNTER — Ambulatory Visit: Payer: Self-pay

## 2020-01-08 ENCOUNTER — Ambulatory Visit: Payer: Medicare Other | Admitting: Physician Assistant

## 2020-01-08 ENCOUNTER — Other Ambulatory Visit: Payer: Self-pay

## 2020-01-08 DIAGNOSIS — M25561 Pain in right knee: Secondary | ICD-10-CM

## 2020-01-08 DIAGNOSIS — G8929 Other chronic pain: Secondary | ICD-10-CM | POA: Diagnosis not present

## 2020-01-08 DIAGNOSIS — M25562 Pain in left knee: Secondary | ICD-10-CM | POA: Diagnosis not present

## 2020-01-08 NOTE — Progress Notes (Signed)
Office Visit Note   Patient: Lance [name]           Date of Birth: Nov 16, 1931           MRN: [id number] Visit Date: 01/08/2020              Requested by: Crist Infante, MD [address] [place],  [place] [address] PCP: Crist Infante, MD   Assessment & Plan: Visit Diagnoses:  1. Chronic pain of both knees     Plan: Due to the fact the patient is failed conservative treatment which is included injections: Physical therapy and continues to have pain in both knees recommend bilateral knee braces to help offload knees.  Did discuss with he and his wife who is present throughout exam today that he needs to work on quad strengthening and strengthening his legs as he does walk with both knees slightly flexed.  He also needs to work on hamstring stretching.  Follow-Up Instructions: Return in about 8 weeks (around 03/04/2020).   Orders:  Orders Placed This Encounter  Procedures  . XR Knee 1-2 Views Right  . XR Knee 1-2 Views Left   No orders of the defined types were placed in this encounter.     Procedures: No procedures performed   Clinical Data: No additional findings.   Subjective: Chief Complaint  Patient presents with  . Right Knee - Pain  . Left Knee - Pain    HPI [name] comes in today due to bilateral knee pain.  Is been sometime since we have seen him.  He has had no new injury to either knee.  Does state he is not able to work with his physical therapist due to [place].  He is now ambulating with a walker was able to get around a cane.  Is status post left knee unicompartment arthroplasty 02/25/2016.  Currently his right knee is more bothersome than his left.  Most of his pain is medial and anterior aspect of both knees.  He is asking about knee braces to offload both knees help with his overall gait and balance.  Review of Systems Please see HPI otherwise negative  Objective: Vital Signs: There were no vitals taken for this visit.  Physical  Exam Constitutional:      Appearance: He is not ill-appearing or diaphoretic.  Pulmonary:     Effort: Pulmonary effort is normal.  Neurological:     Mental Status: He is alert and oriented to person, place, and time.  Psychiatric:        Mood and Affect: Mood normal.     Ortho Exam Ambulates with a rolling walker.  Walks with both knees slightly bent.  Right knee with varus deformity left knee with valgus deformity.  Tenderness along medial joint line of both knees.  No abnormal warmth erythema of either knee.  No instability valgus varus stressing of either knee.  Able to bring both knees within 5 to 7 degrees of full extension.  Tight hamstrings bilaterally.  Quad atrophy bilaterally. Specialty Comments:  No specialty comments available.  Imaging: XR Knee 1-2 Views Left  Result Date: 01/08/2020 Left knee 2 views: No acute fracture.  Knee is well located.  Status post medial unicompartmental arthroplasty hardware appears well-seated.  End-stage arthritis lateral compartment.  Moderate patellofemoral changes.  XR Knee 1-2 Views Right  Result Date: 01/08/2020 Right knee 3 views: No acute fracture slight varus deformity.  Severe tricompartmental end-stage arthritis.  Knee is well located.    [place]  History: Patient Active Problem List   Diagnosis Date Noted  . Pes anserinus bursitis of left knee 03/01/2017  . Primary osteoarthritis of right knee 02/15/2017  . Dizziness 10/28/2016  . 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 Osteoarthritis of left knee 02/25/2016  . S/P left unicompartmental knee replacement 02/25/2016  . Spinal stenosis at L4-L5 level 11/11/2015  . Aspiration pneumonia of left lower lobe due to gastric secretions ([place]) 10/22/2015  . Bilateral leg weakness 10/22/2015  . 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  . OSA treated with BiPAP 10/22/2015  . Ambulatory dysfunction 10/22/2015  . CVA (cerebral infarction) 06/03/2013  . Syncope 06/03/2013   . Facial laceration 06/03/2013  . Acute bronchitis 06/03/2013  . Hypertension    Past Medical History:  Diagnosis Date  . Alcohol abuse    Remote hx, stopped in 1970  . Anxiety   . BPH (benign prostatic hypertrophy) with urinary obstruction   . Colon polyps   . Depression   . Diverticulosis   . Glaucoma   . Headache    sinus headaches  . Hypercholesterolemia   . Hypertension   . Hypogonadism male   . OSA on CPAP   . Osteoarthritis   . Osteopenia   . Sleep apnea   . Vitamin D deficiency     Family History  Problem Relation Age of Onset  . Colon polyps Son   . Colon polyps Daughter   . Colon polyps Son   . Stroke Neg Hx   . Colon cancer Neg Hx     Past Surgical History:  Procedure Laterality Date  . APPENDECTOMY  1945  . BACK SURGERY    . EYE SURGERY     bilateral cataracts  . LUMBAR LAMINECTOMY/DECOMPRESSION MICRODISCECTOMY Bilateral 11/11/2015   Procedure: Bilateral L4-5 Laminectomy;  Surgeon: Kary Kos, MD;  Location: [facility];  Service: Neurosurgery;  Laterality: Bilateral;  Bilateral L4-5 Laminectomy  . PARTIAL KNEE ARTHROPLASTY Left 02/25/2016   Procedure: LEFT UNICOMPARTMENTAL KNEE ARTHROPLASTY;  Surgeon: Mcarthur Rossetti, MD;  Location: [facility];  Service: Orthopedics;  Laterality: Left;  . REPLACEMENT UNICONDYLAR JOINT KNEE Left 02/25/2016  . SHOULDER SURGERY Right   . TONSILLECTOMY     Social History   Occupational History  . Occupation: retired    Fish farm manager: RETIRED  Tobacco Use  . Smoking status: Former Smoker    Quit date: 09/28/1962    Years since quitting: 57.3  . Smokeless tobacco: Never Used  Substance and Sexual Activity  . Alcohol use: Not Currently  . Drug use: No  . Sexual activity: Not on file

## 2020-01-19 ENCOUNTER — Telehealth: Payer: Self-pay | Admitting: Orthopedic Surgery

## 2020-01-19 ENCOUNTER — Telehealth: Payer: Self-pay | Admitting: Orthopaedic Surgery

## 2020-01-19 NOTE — Telephone Encounter (Signed)
Patient's wife called.   They wanted to see where we were at in the process of obtaining a brace for the client.  Call back: [phone]

## 2020-01-19 NOTE — Telephone Encounter (Signed)
Made in error

## 2020-01-19 NOTE — Telephone Encounter (Signed)
Holding for you.  

## 2020-01-22 NOTE — Telephone Encounter (Signed)
Sent Email to Ryan[employer] to call patient

## 2020-01-25 ENCOUNTER — Telehealth: Payer: Self-pay | Admitting: Physician Assistant

## 2020-01-25 NOTE — Telephone Encounter (Signed)
Wife states she found the number for the braces and they actually came today

## 2020-01-25 NOTE — Telephone Encounter (Signed)
Patient's wife Lance [name] requesting a call back about braces was ordered couple years ago. Patient phone number is [phone]

## 2020-07-10 ENCOUNTER — Ambulatory Visit (HOSPITAL_COMMUNITY)
Admission: RE | Admit: 2020-07-10 | Discharge: 2020-07-10 | Disposition: A | Discharge status: Home / Self Care | Payer: Medicare Other | Source: Ambulatory Visit | Attending: Internal Medicine | Admitting: Internal Medicine

## 2020-07-10 ENCOUNTER — Other Ambulatory Visit: Payer: Self-pay

## 2020-07-10 ENCOUNTER — Other Ambulatory Visit (HOSPITAL_COMMUNITY): Payer: Self-pay | Admitting: Internal Medicine

## 2020-07-10 DIAGNOSIS — I739 Peripheral vascular disease, unspecified: Secondary | ICD-10-CM | POA: Diagnosis present

## 2020-11-18 ENCOUNTER — Encounter: Payer: Self-pay | Admitting: Orthopaedic Surgery

## 2021-11-28 ENCOUNTER — Other Ambulatory Visit: Payer: Self-pay | Admitting: Internal Medicine

## 2021-11-28 ENCOUNTER — Ambulatory Visit
Admission: RE | Admit: 2021-11-28 | Discharge: 2021-11-28 | Disposition: A | Discharge status: Home / Self Care | Payer: Medicare Other | Source: Ambulatory Visit | Attending: Internal Medicine | Admitting: Internal Medicine

## 2021-11-28 ENCOUNTER — Other Ambulatory Visit: Payer: Self-pay

## 2021-11-28 DIAGNOSIS — R062 Wheezing: Secondary | ICD-10-CM

## 2021-11-29 ENCOUNTER — Emergency Department (HOSPITAL_COMMUNITY): Payer: Medicare Other

## 2021-11-29 ENCOUNTER — Emergency Department (HOSPITAL_COMMUNITY)
Admission: EM | Admit: 2021-11-29 | Discharge: 2021-11-29 | Disposition: A | Discharge status: Home / Self Care | Payer: Medicare Other | Attending: Emergency Medicine | Admitting: Emergency Medicine

## 2021-11-29 ENCOUNTER — Encounter (HOSPITAL_COMMUNITY): Payer: Self-pay | Admitting: Emergency Medicine

## 2021-11-29 DIAGNOSIS — R0981 Nasal congestion: Secondary | ICD-10-CM | POA: Insufficient documentation

## 2021-11-29 DIAGNOSIS — Z7982 Long term (current) use of aspirin: Secondary | ICD-10-CM | POA: Insufficient documentation

## 2021-11-29 DIAGNOSIS — R1032 Left lower quadrant pain: Secondary | ICD-10-CM | POA: Insufficient documentation

## 2021-11-29 DIAGNOSIS — M48061 Spinal stenosis, lumbar region without neurogenic claudication: Secondary | ICD-10-CM | POA: Diagnosis not present

## 2021-11-29 DIAGNOSIS — R109 Unspecified abdominal pain: Secondary | ICD-10-CM

## 2021-11-29 LAB — COMPREHENSIVE METABOLIC PANEL
ALT: 16 U/L (ref 0–44)
AST: 22 U/L (ref 15–41)
Albumin: 3.6 g/dL (ref 3.5–5.0)
Alkaline Phosphatase: 51 U/L (ref 38–126)
Anion gap: 6 (ref 5–15)
BUN: 18 mg/dL (ref 8–23)
CO2: 28 mmol/L (ref 22–32)
Calcium: 8.6 mg/dL — ABNORMAL LOW (ref 8.9–10.3)
Chloride: 99 mmol/L (ref 98–111)
Creatinine, Ser: 0.8 mg/dL (ref 0.61–1.24)
GFR, Estimated: >60 mL/min (ref >60)
Glucose, Bld: 91 mg/dL (ref 70–99)
Potassium: 3.9 mmol/L (ref 3.5–5.1)
Sodium: 133 mmol/L — ABNORMAL LOW (ref 135–145)
Total Bilirubin: 0.5 mg/dL (ref 0.3–1.2)
Total Protein: 6.6 g/dL (ref 6.5–8.1)

## 2021-11-29 LAB — URINALYSIS, ROUTINE W REFLEX MICROSCOPIC
Bilirubin Urine: NEGATIVE
Glucose, UA: NEGATIVE mg/dL
Hgb urine dipstick: NEGATIVE
Ketones, ur: NEGATIVE mg/dL
Leukocytes,Ua: NEGATIVE
Nitrite: NEGATIVE
Protein, ur: NEGATIVE mg/dL
Specific Gravity, Urine: 1.008 (ref 1.005–1.030)
pH: 6 (ref 5.0–8.0)

## 2021-11-29 LAB — CBC
HCT: 38.8 % — ABNORMAL LOW (ref 39.0–52.0)
Hemoglobin: 13.1 g/dL (ref 13.0–17.0)
MCH: 32.3 pg (ref 26.0–34.0)
MCHC: 33.8 g/dL (ref 30.0–36.0)
MCV: 95.8 fL (ref 80.0–100.0)
Platelets: 255 10*3/uL (ref 150–400)
RBC: 4.05 MIL/uL — ABNORMAL LOW (ref 4.22–5.81)
RDW: 12.6 % (ref 11.5–15.5)
WBC: 11.4 10*3/uL — ABNORMAL HIGH (ref 4.0–10.5)
nRBC: 0 % (ref 0.0–0.2)

## 2021-11-29 LAB — LIPASE, BLOOD: Lipase: 23 U/L (ref 11–51)

## 2021-11-29 MED ORDER — OXYCODONE-ACETAMINOPHEN 5-325 MG PO TABS: 1.0000 | ORAL_TABLET | Freq: Four times a day (QID) | ORAL | 0 refills | Status: DC | PRN

## 2021-11-29 MED ORDER — LIDOCAINE 5 % EX PTCH
1.0000 | MEDICATED_PATCH | CUTANEOUS | Status: DC
  Administered 2021-11-29: 1 via TRANSDERMAL
  Filled 2021-11-29: qty 1

## 2021-11-29 MED ORDER — LIDOCAINE 5 % EX PTCH: 1.0000 | MEDICATED_PATCH | CUTANEOUS | 0 refills | Status: AC

## 2021-11-29 MED ORDER — OXYCODONE-ACETAMINOPHEN 5-325 MG PO TABS
1.0000 | ORAL_TABLET | Freq: Once | ORAL | Status: AC
  Administered 2021-11-29: 1 via ORAL
  Filled 2021-11-29: qty 1

## 2021-11-29 NOTE — ED Triage Notes (Signed)
Patient complains of a sharp L flank pain that started 3 days ago, off and on for a long time. Denies urinary changes.  ?

## 2021-11-29 NOTE — Discharge Instructions (Addendum)
Follow up with Dr. Nelva Bush as planned. I suspect your pain is coming from your back.  ?Apply lidoderm patch to area as prescribed. ?Take Percocet as needed as directed for pain. ?STOP the Hydrocodone if taking Percocet.  ?These medications can cause constipation, management with laxities and stool softeners as needed. ?

## 2021-11-29 NOTE — ED Provider Notes (Signed)
[facility] DEPT Provider Note   CSN: [id number] Arrival date & time: 11/29/21  1130     History  Chief Complaint  Patient presents with   Flank Pain    Lance [name] is a 88 y.o. male.  88 year old male with past medical history of osteoarthritis, BPH, hyperlipidemia presents with complaint of left flank pain onset 1 week ago.  Pain is worse with movement, sharp in nature, does not radiate although states pain has moved down about 2 inches from where it started.  He denies any associated abdominal pain, chest pain, shortness of breath, changes in bowel or bladder habits, nausea or vomiting.  Patient has been to Dr. Herma Mering for pain in his knees, unsure what is causing source of his pain at this point in time, no history of kidney stones.  Denies falls or injuries, fevers.  Wife states patient is constantly leaning forward to help with pain. Has a walker and wheelchair at home. Family has a 3 week trip planned for [place] in 1 week and is concerned patient cannot travel with the pain he is experiencing. No other complaints or concerns.      Home Medications Prior to Admission medications   Medication Sig Start Date End Date Taking? Authorizing Provider  lidocaine (LIDODERM) 5 % Place 1 patch onto the skin daily. Remove & Discard patch within 12 hours or as directed by MD 11/29/21  Yes Tacy Learn, PA-C  oxyCODONE-acetaminophen (PERCOCET/ROXICET) 5-325 MG tablet Take 1 tablet by mouth every 6 (six) hours as needed for severe pain. 11/29/21  Yes Tacy Learn, PA-C  Ascorbic Acid (VITAMIN C PO) Take 1 tablet by mouth daily.    [provider]  aspirin 325 MG EC tablet Take 325 mg by mouth daily.    [provider]  buPROPion (WELLBUTRIN SR) 150 MG 12 hr tablet Take 150 mg by mouth 2 (two) times daily.    [provider]  CALCIUM CARBONATE PO Take 1 tablet by mouth daily.    [provider]  Cyanocobalamin  (VITAMIN B12 PO) Take 1 tablet by mouth daily.    [provider]  diazepam (VALIUM) 10 MG tablet Take 10 mg by mouth every 8 (eight) hours as needed for anxiety.    [provider]  doxepin (SINEQUAN) 75 MG capsule Take 75 mg by mouth at bedtime.    [provider]  ezetimibe (ZETIA) 10 MG tablet Take 10 mg by mouth daily.    [provider]  finasteride (PROSCAR) 5 MG tablet Take 5 mg by mouth daily.    [provider]  mirabegron ER (MYRBETRIQ) 50 MG TB24 tablet Take 50 mg by mouth daily.    [provider]  Multiple Vitamin (MULTIVITAMIN) capsule Take 1 capsule by mouth daily.     [provider]  Omega-3 Fatty Acids (FISH OIL PO) Take 1 capsule by mouth daily.    [provider]  Probiotic Product ([place]) Take 1 capsule by mouth daily.    [provider]  traMADol (ULTRAM) 50 MG tablet Take by mouth every 8 (eight) hours as needed.    [provider]  valsartan (DIOVAN) 40 MG tablet Take 40 mg by mouth 2 (two) times daily.    [provider]      Allergies    Penicillins and Simvastatin    Review of Systems   Review of Systems Negative except as per HPI Physical Exam Updated  Vital Signs BP (!) 180/82    Pulse 64    Temp 97.7 F (36.5 C) (Oral)    Resp 14    Ht 6' (1.829 m)    Wt 97 kg    SpO2 95%    BMI 29.00 kg/m  Physical Exam Vitals and nursing note reviewed.  Constitutional:      General: He is not in acute distress.    Appearance: He is well-developed. He is not diaphoretic.  HENT:     Head: Normocephalic and atraumatic.  Cardiovascular:     Rate and Rhythm: Normal rate and regular rhythm.     Pulses: Normal pulses.     Heart sounds: Normal heart sounds.  Pulmonary:     Effort: Pulmonary effort is normal.     Breath sounds: Normal breath sounds.  Abdominal:     Palpations: Abdomen is soft.     Tenderness: There is no abdominal tenderness.   Musculoskeletal:        General: Tenderness present. No swelling or deformity.     Thoracic back: No bony tenderness.     Lumbar back: Tenderness present. No bony tenderness. Negative right straight leg raise test and negative left straight leg raise test.       Back:     Right lower leg: No edema.     Left lower leg: No edema.  Skin:    General: Skin is warm and dry.     Findings: No erythema or rash.  Neurological:     Mental Status: He is alert and oriented to person, place, and time.     Sensory: No sensory deficit.     Motor: No weakness.     Deep Tendon Reflexes: Babinski sign absent on the right side. Babinski sign absent on the left side.  Psychiatric:        Behavior: Behavior normal.    ED Results / Procedures / Treatments   Labs (all labs ordered are listed, but only abnormal results are displayed) Labs Reviewed  COMPREHENSIVE METABOLIC PANEL - Abnormal; Notable for the following components:      Result Value   Sodium 133 (*)    Calcium 8.6 (*)    All other components within normal limits  CBC - Abnormal; Notable for the following components:   WBC 11.4 (*)    RBC 4.05 (*)    HCT 38.8 (*)    All other components within normal limits  URINALYSIS, ROUTINE W REFLEX MICROSCOPIC - Abnormal; Notable for the following components:   Color, Urine STRAW (*)    All other components within normal limits  LIPASE, BLOOD    EKG None  Radiology DG Chest 2 View  Result Date: 11/28/2021 CLINICAL DATA:  Wheezing, chest congestion EXAM: CHEST - 2 VIEW COMPARISON:  Previous studies including the examination of 10/28/2017 FINDINGS: Cardiac size is within normal limits. Thoracic aorta is tortuous and ectatic. There are small linear densities in the left lower lung fields suggesting scarring or subsegmental atelectasis. There is interval clearing of small linear density in the right mid lung fields. There is no new focal pulmonary consolidation. There is no pleural effusion or  pneumothorax. IMPRESSION: Linear densities in the left lower lung fields may suggest scarring or subsegmental atelectasis. There are no signs of pulmonary edema or focal pulmonary consolidation. Electronically Signed   By: Elmer Picker M.D.   On: 11/28/2021 15:06   CT Renal Stone Study  Result Date: 11/29/2021 CLINICAL DATA:  Left flank pain  EXAM: CT ABDOMEN AND PELVIS WITHOUT CONTRAST TECHNIQUE: Multidetector CT imaging of the abdomen and pelvis was performed following the standard protocol without IV contrast. RADIATION DOSE REDUCTION: This exam was performed according to the departmental dose-optimization program which includes automated exposure control, adjustment of the mA and/or kV according to patient size and/or use of iterative reconstruction technique. COMPARISON:  10/29/2011 FINDINGS: Lower chest: There are scattered coronary artery calcifications. Small linear densities seen in the lower lung fields suggesting scarring or subsegmental atelectasis. Hepatobiliary: No focal abnormality is seen in the liver. Gallbladder is distended. There is no wall thickening in gallbladder. There is no fluid around the gallbladder. Pancreas: There is fatty infiltration with interval worsening. Diverticulum is noted along the inner margin of second portion of duodenum. Spleen: Unremarkable Adrenals/Urinary Tract: Adrenals are unremarkable. There is no hydronephrosis. There are no renal or ureteral stones. Urinary bladder is unremarkable. Stomach/Bowel: Stomach is not distended. Small bowel loops are unremarkable. Appendix is not distinctly seen. There is no pericecal inflammation. There is no significant wall thickening in colon. Scattered diverticula are seen without signs of focal acute diverticulitis. Vascular/Lymphatic: There is tortuosity and ectasia in the abdominal aorta. There is 3.4 cm aneurysm in the infrarenal aorta. Reproductive: Prostate is enlarged projecting into the base of the bladder. Other:  There is no ascites or pneumoperitoneum. Umbilical hernia containing fat is seen. Bilateral inguinal hernias containing fat are noted. Musculoskeletal: Degenerative changes are noted in the lumbar spine with disc space narrowing, bony spurs and facet hypertrophy. There is spinal stenosis at L4-L5 level. There is encroachment of neural foramina at multiple levels. IMPRESSION: There is no evidence intestinal obstruction or pneumoperitoneum. There is no hydronephrosis. Diverticulosis of colon without signs of focal diverticulitis. 3.4 cm aneurysm is seen in the infrarenal aorta. Enlarged prostate. Other findings as described in the body of the report. Electronically Signed   By: Elmer Picker M.D.   On: 11/29/2021 12:16    Procedures Procedures    Medications Ordered in ED Medications  lidocaine (LIDODERM) 5 % 1 patch (1 patch Transdermal Patch Applied 11/29/21 1357)  oxyCODONE-acetaminophen (PERCOCET/ROXICET) 5-325 MG per tablet 1 tablet (1 tablet Oral Given 11/29/21 1206)    ED Course/ Medical Decision Making/ A&P                           Medical Decision Making Amount and/or Complexity of Data Reviewed Labs: ordered. Radiology: ordered.  Risk Prescription drug management.   This patient presents to the ED for concern of left lower back pain, this involves an extensive number of treatment options, and is a complaint that carries with it a high risk of complications and morbidity.  The differential diagnosis includes but not limited to degenerative disc disease, kidney stone, diverticulitis, lumbar strain   Co morbidities that complicate the patient evaluation  Osteoporosis, BPH, osteopenia, diverticulitis    Additional history obtained:  Additional history obtained from wife at bedside, provides history regarding work up with Dr. Nelva Bush, recent URI treated with tessalon and Prednisone  External records from outside source obtained and reviewed including record from 11/18/21 visit  to Dr. Nelva Bush (ortho), rx for diazepam '10mg'$ , norco 10/325, tramadol '50mg'$    Lab Tests:  I Ordered, and personally interpreted labs.  The pertinent results include:  CBC with WBC 11.4 (likely secondary to recent prednisone course), CMP without significant findings, lipase WNL.    Imaging Studies ordered:  I ordered imaging studies including CT abdomen/pelvis without  contrast  Read by radiologist, no renal/ureteral stones, 3.4cm infrarenal aneurysm, negative for diverticulitis. Degenerative disc disease with foraminal narrowing. I agree with the radiologist interpretation  Medicines ordered and prescription drug management:  I ordered medication including percocet, lidoderm  for back pain  Reevaluation of the patient after these medicines showed that the patient improved I have reviewed the patients home medicines and have made adjustments as needed   Problem List / ED Course:  88 year old male presents with complaint of 1 week of left flank pain, worse with movement, somewhat improves if he leans forward however is not walking well with his walker or wanting to use his wheelchair.  Patient has been going to Dr. Herma Mering at orthopedics office in regards to some pain in his knee, is scheduled to follow-up on Tuesday regarding this back pain.  Patient is taking his hydrocodone without improvement.  He denies abdominal pain or any other associated symptoms. On exam, pain is reproduced with palpation of left paraspinous area, he has equal leg strength, DP pulses with sensation and reflexes intact.  Abdomen is soft and nontender.  Labs reviewed and are reassuring, CT abdomen pelvis with incidental finding of 3.4 cm infrarenal aneurysm, discussed finding with patient and advised to follow-up with primary care provider for monitoring or further evaluation as needed.  Discussed with Dr. Alvino Chapel, no further work-up for this needed at this time.  Patient is found to have degenerative changes to his lumbar  spine, suspect this is what is causing his pain. Patient was given Lidoderm patch and Percocet, his pain is significantly improved and he is able to ambulate without difficulty.  Plan is to send prescription for same to his pharmacy, advised to stop his hydrocodone if taking the Percocet.  Manage constipation as needed and follow-up with his Ortho as planned.          Final Clinical Impression(s) / ED Diagnoses Final diagnoses:  Left flank pain  Spinal stenosis at L4-L5 level    Rx / DC Orders ED Discharge Orders          Ordered    oxyCODONE-acetaminophen (PERCOCET/ROXICET) 5-325 MG tablet  Every 6 hours PRN        11/29/21 1451    lidocaine (LIDODERM) 5 %  Every 24 hours        11/29/21 1451              Tacy Learn, PA-C 11/29/21 1457    Davonna Belling, MD 11/29/21 1529

## 2021-11-29 NOTE — ED Provider Triage Note (Signed)
Emergency Medicine Provider Triage Evaluation Note ? ?Lance [name] , a 88 y.o. male  was evaluated in triage.  Pt complains of left flank pain onset 1 week ago.  ? ?Review of Systems  ?Positive: Flank pain  ?Negative: Nausea, vomiting, changes in bowel or bladder habits ? ?Physical Exam  ?BP (!) 169/73 (BP Location: Left Arm)   Pulse 66   Temp 97.7 ?F (36.5 ?C) (Oral)   Resp 18   Ht 6' (1.829 m)   Wt 97 kg   SpO2 94%   BMI 29.00 kg/m?  ?Gen:   Awake, no distress   ?Resp:  Normal effort  ?MSK:   Moves extremities without difficulty  ?Other:  Left flank TTP, no CVA tenderness ? ?Medical Decision Making  ?Medically screening exam initiated at 11:50 AM.  Appropriate orders placed.  Lance [name] was informed that the remainder of the evaluation will be completed by another provider, this initial triage assessment does not replace that evaluation, and the importance of remaining in the ED until their evaluation is complete. ? ? ?  ?Tacy Learn, PA-C ?11/29/21 1151 ? ?

## 2021-12-01 ENCOUNTER — Encounter (HOSPITAL_COMMUNITY): Payer: Self-pay | Admitting: Emergency Medicine

## 2022-02-01 ENCOUNTER — Other Ambulatory Visit: Payer: Self-pay

## 2022-02-01 ENCOUNTER — Emergency Department (HOSPITAL_COMMUNITY): Payer: Medicare Other

## 2022-02-01 ENCOUNTER — Emergency Department (HOSPITAL_COMMUNITY)
Admission: EM | Admit: 2022-02-01 | Discharge: 2022-02-01 | Disposition: A | Discharge status: Home / Self Care | Payer: Medicare Other | Attending: Emergency Medicine | Admitting: Emergency Medicine

## 2022-02-01 DIAGNOSIS — R079 Chest pain, unspecified: Secondary | ICD-10-CM | POA: Insufficient documentation

## 2022-02-01 DIAGNOSIS — I10 Essential (primary) hypertension: Secondary | ICD-10-CM | POA: Insufficient documentation

## 2022-02-01 DIAGNOSIS — Z7982 Long term (current) use of aspirin: Secondary | ICD-10-CM | POA: Insufficient documentation

## 2022-02-01 DIAGNOSIS — Z79899 Other long term (current) drug therapy: Secondary | ICD-10-CM | POA: Diagnosis not present

## 2022-02-01 LAB — CBC WITH DIFFERENTIAL/PLATELET
Abs Immature Granulocytes: 0.04 10*3/uL (ref 0.00–0.07)
Basophils Absolute: 0 10*3/uL (ref 0.0–0.1)
Basophils Relative: 0 %
Eosinophils Absolute: 0.2 10*3/uL (ref 0.0–0.5)
Eosinophils Relative: 2 %
HCT: 41 % (ref 39.0–52.0)
Hemoglobin: 14 g/dL (ref 13.0–17.0)
Immature Granulocytes: 0 %
Lymphocytes Relative: 24 %
Lymphs Abs: 2.4 10*3/uL (ref 0.7–4.0)
MCH: 32.1 pg (ref 26.0–34.0)
MCHC: 34.1 g/dL (ref 30.0–36.0)
MCV: 94 fL (ref 80.0–100.0)
Monocytes Absolute: 0.8 10*3/uL (ref 0.1–1.0)
Monocytes Relative: 8 %
Neutro Abs: 6.6 10*3/uL (ref 1.7–7.7)
Neutrophils Relative %: 66 %
Platelets: 253 10*3/uL (ref 150–400)
RBC: 4.36 MIL/uL (ref 4.22–5.81)
RDW: 13 % (ref 11.5–15.5)
WBC: 10.1 10*3/uL (ref 4.0–10.5)
nRBC: 0 % (ref 0.0–0.2)

## 2022-02-01 LAB — TROPONIN I (HIGH SENSITIVITY)
Troponin I (High Sensitivity): 4 ng/L (ref <18)
Troponin I (High Sensitivity): 4 ng/L (ref <18)

## 2022-02-01 LAB — COMPREHENSIVE METABOLIC PANEL
ALT: 13 U/L (ref 0–44)
AST: 17 U/L (ref 15–41)
Albumin: 3.7 g/dL (ref 3.5–5.0)
Alkaline Phosphatase: 56 U/L (ref 38–126)
Anion gap: 7 (ref 5–15)
BUN: 11 mg/dL (ref 8–23)
CO2: 25 mmol/L (ref 22–32)
Calcium: 8.6 mg/dL — ABNORMAL LOW (ref 8.9–10.3)
Chloride: 98 mmol/L (ref 98–111)
Creatinine, Ser: 0.74 mg/dL (ref 0.61–1.24)
GFR, Estimated: >60 mL/min (ref >60)
Glucose, Bld: 113 mg/dL — ABNORMAL HIGH (ref 70–99)
Potassium: 3.9 mmol/L (ref 3.5–5.1)
Sodium: 130 mmol/L — ABNORMAL LOW (ref 135–145)
Total Bilirubin: 0.6 mg/dL (ref 0.3–1.2)
Total Protein: 6.5 g/dL (ref 6.5–8.1)

## 2022-02-01 NOTE — Discharge Instructions (Signed)
Follow-up with your primary care doctor for recheck.  Return to the emergency room if you have any worsening symptoms. ?

## 2022-02-01 NOTE — ED Notes (Signed)
Patient up to the bathroom with assistance ?

## 2022-02-01 NOTE — ED Provider Notes (Signed)
?[facility] DEPT ?Provider Note ? ? ?CSN: [id number] ?Arrival date & time: 02/01/22  1837 ? ?  ? ?History ? ?Chief Complaint  ?Patient presents with  ? Chest Pain  ? ? ?Quashawn Jewkes is a 88 y.o. male. ? ?Patient is a 88 year old male with a history of hypertension, hyperlipidemia, BPH who presents after an episode of chest pain.  History was obtained from the patient as well as the patient's wife and son.  He has a history of osteoarthritis of his knees with significant pain to these areas.  He has been taking hydrocodone and he has been constipated due to this.  He has been using MiraLAX and Dulcolax for the last 3 to 4 days.  He was on the toilet for about an hour having a bowel movement earlier today.  He did have a large bowel movement but when he was done, his wife said he looked pale and he was complaining of some left-sided chest pain.  He said it was in the left side.  No shortness of breath.  No nausea or vomiting.  No diaphoresis.  He laid down and took a nap.  When he woke up he was not having any more chest pain.  He has not had any further episodes of chest pain since that time.  No known history of coronary artery disease. ? ? ?  ? ?Home Medications ?Prior to Admission medications   ?Medication Sig Start Date End Date Taking? Authorizing Provider  ?Ascorbic Acid (VITAMIN C PO) Take 1 tablet by mouth daily.    [provider]  ?aspirin 325 MG EC tablet Take 325 mg by mouth daily.    [provider]  ?buPROPion (WELLBUTRIN SR) 150 MG 12 hr tablet Take 150 mg by mouth 2 (two) times daily.    [provider]  ?CALCIUM CARBONATE PO Take 1 tablet by mouth daily.    [provider]  ?Cyanocobalamin (VITAMIN B12 PO) Take 1 tablet by mouth daily.    [provider]  ?diazepam (VALIUM) 10 MG tablet Take 10 mg by mouth every 8 (eight) hours as needed for anxiety.    [provider]  ?doxepin (SINEQUAN) 75 MG capsule Take 75  mg by mouth at bedtime.    [provider]  ?ezetimibe (ZETIA) 10 MG tablet Take 10 mg by mouth daily.    [provider]  ?finasteride (PROSCAR) 5 MG tablet Take 5 mg by mouth daily.    [provider]  ?lidocaine (LIDODERM) 5 % Place 1 patch onto the skin daily. Remove & Discard patch within 12 hours or as directed by MD 11/29/21   Tacy Learn, PA-C  ?mirabegron ER (MYRBETRIQ) 50 MG TB24 tablet Take 50 mg by mouth daily.    [provider]  ?Multiple Vitamin (MULTIVITAMIN) capsule Take 1 capsule by mouth daily.     [provider]  ?Omega-3 Fatty Acids (FISH OIL PO) Take 1 capsule by mouth daily.    [provider]  ?oxyCODONE-acetaminophen (PERCOCET/ROXICET) 5-325 MG tablet Take 1 tablet by mouth every 6 (six) hours as needed for severe pain. 11/29/21   Tacy Learn, PA-C  ?Probiotic Product ([place] [employer] [facility] PO) Take 1 capsule by mouth daily.    [provider]  ?traMADol (ULTRAM) 50 MG tablet Take by mouth every 8 (eight) hours as needed.    [provider]  ?valsartan (DIOVAN) 40 MG tablet Take 40 mg by mouth 2 (two) times daily.  [provider]  ?   ? ?Allergies    ?Penicillins and Simvastatin   ? ?Review of Systems   ?Review of Systems  ?Constitutional:  Negative for chills, diaphoresis, fatigue and fever.  ?HENT:  Negative for congestion, rhinorrhea and sneezing.   ?Eyes: Negative.   ?Respiratory:  Negative for cough, chest tightness and shortness of breath.   ?Cardiovascular:  Positive for chest pain. Negative for leg swelling.  ?Gastrointestinal:  Negative for abdominal pain, blood in stool, diarrhea, nausea and vomiting.  ?Genitourinary:  Negative for difficulty urinating, flank pain, frequency and hematuria.  ?Musculoskeletal:  Negative for arthralgias and back pain.  ?Skin:  Negative for rash.  ?Neurological:  Negative for dizziness, speech difficulty, weakness, numbness and headaches.  ? ?Physical Exam ?Updated  Vital Signs ?BP (!) 187/93   Pulse 75   Temp 98 ?F (36.7 ?C) (Oral)   Resp (!) 22   Ht 6' (1.829 m)   Wt 90.7 kg   SpO2 98%   BMI 27.12 kg/m?  ?Physical Exam ?Constitutional:   ?   Appearance: He is well-developed.  ?HENT:  ?   Head: Normocephalic and atraumatic.  ?Eyes:  ?   Pupils: Pupils are equal, round, and reactive to light.  ?Cardiovascular:  ?   Rate and Rhythm: Normal rate and regular rhythm.  ?   Heart sounds: Normal heart sounds.  ?Pulmonary:  ?   Effort: Pulmonary effort is normal. No respiratory distress.  ?   Breath sounds: Normal breath sounds. No wheezing or rales.  ?Chest:  ?   Chest wall: No tenderness.  ?Abdominal:  ?   General: Bowel sounds are normal.  ?   Palpations: Abdomen is soft.  ?   Tenderness: There is no abdominal tenderness. There is no guarding or rebound.  ?Musculoskeletal:     ?   General: Normal range of motion.  ?   Cervical back: Normal range of motion and neck supple.  ?Lymphadenopathy:  ?   Cervical: No cervical adenopathy.  ?Skin: ?   General: Skin is warm and dry.  ?   Findings: No rash.  ?Neurological:  ?   Mental Status: He is alert and oriented to person, place, and time.  ? ? ?ED Results / Procedures / Treatments   ?Labs ?(all labs ordered are listed, but only abnormal results are displayed) ?Labs Reviewed  ?COMPREHENSIVE METABOLIC PANEL - Abnormal; Notable for the following components:  ?    Result Value  ? Sodium 130 (*)   ? Glucose, Bld 113 (*)   ? Calcium 8.6 (*)   ? All other components within normal limits  ?CBC WITH DIFFERENTIAL/PLATELET  ?TROPONIN I (HIGH SENSITIVITY)  ?TROPONIN I (HIGH SENSITIVITY)  ? ? ?EKG ?EKG Interpretation ? ?Date/Time:  Sunday Feb 01 2022 19:07:43 EDT ?Ventricular Rate:  91 ?PR Interval:  201 ?QRS Duration: 90 ?QT Interval:  370 ?QTC Calculation: 456 ?R Axis:   -48 ?Text Interpretation: Sinus rhythm Inferior infarct, old Anterior infarct, old since last tracing no significant change Confirmed by Malvin Johns [phone]) on 02/01/2022  7:19:04 PM ? ?Radiology ?DG Abdomen Acute W/Chest ? ?Result Date: 02/01/2022 ?CLINICAL DATA:  Constipated for 4 days. Left-sided chest pain during bowel movement. EXAM: DG ABDOMEN ACUTE WITH 1 VIEW CHEST COMPARISON:  November 28, 2021 FINDINGS: There is no evidence of dilated bowel loops or free intraperitoneal air. A mild to moderate amount of stool is seen within the ascending colon. No radiopaque calculi or other significant radiographic abnormality is  seen. Heart size and mediastinal contours are within normal limits. Mild atelectasis is seen within the left lung base. IMPRESSION: 1. Mild to moderate stool burden within the ascending colon, without bowel obstruction. 2. Mild left basilar atelectasis. Electronically Signed   By: Virgina Norfolk M.D.   On: 02/01/2022 19:30   ? ?Procedures ?Procedures  ? ? ?Medications Ordered in ED ?Medications - No data to display ? ?ED Course/ Medical Decision Making/ A&P ?  ?                        ?Medical Decision Making ?Amount and/or Complexity of Data Reviewed ?Independent Historian: spouse ?External Data Reviewed: labs and notes. ?Labs: ordered. Decision-making details documented in ED Course. ?Radiology: ordered and independent interpretation performed. Decision-making details documented in ED Course. ?ECG/medicine tests: ordered and independent interpretation performed. Decision-making details documented in ED Course. ? ? ?Patient is a 88 year old male who presents with episode of chest pain after he had a bowel movement.  Sounds like he may have had a vasovagal type episode.  He was reported to be pale after the bowel movement and complained of chest pain for a brief time.  He laid down took a nap and was feeling better when he woke up.  He was not have any chest pain when he woke up but he did still talk to his wife about possibly having a heart attack.  He has not had any further episodes of chest pain.  His EKG does not show any ischemic changes.  He had 2 negative  troponins.  He had an acute abdominal series.  There is no evidence of pneumonia or pulmonary edema on the chest x-ray.  There is some stool in the ascending colon but no evidence of bowel obstruction.  He is othe

## 2022-02-01 NOTE — ED Provider Triage Note (Signed)
Emergency Medicine Provider Triage Evaluation Note ? ?Elden Brucato , a 88 y.o. male  was evaluated in triage.  Pt complains of chest pain.  His wife is at bedside and provides most of his history.  She states that he has recently been taking hydrocodone and has been suffering from constipation.  Earlier today while straining during a bowel movement he began having left-sided chest pain.  He then took a nap and woke up free of chest pain.  Currently denies any chest pain. ? ?Review of Systems  ?Positive:  ?Negative:  ? ?Physical Exam  ?Ht 6' (1.829 m)   Wt 90.7 kg   BMI 27.12 kg/m?  ?Gen:   Awake, no distress   ?Resp:  Normal effort  ?MSK:   Moves extremities without difficulty  ?Other:   ? ?Medical Decision Making  ?Medically screening exam initiated at 7:07 PM.  Appropriate orders placed.  Zelig Gacek was informed that the remainder of the evaluation will be completed by another provider, this initial triage assessment does not replace that evaluation, and the importance of remaining in the ED until their evaluation is complete. ?  ?Rayna Sexton, PA-C ?02/01/22 1909 ? ?

## 2022-02-01 NOTE — ED Triage Notes (Signed)
Family states pt has been constipated for 4 days. Today pt had a bowel movement and told family he was having left sided chest pain. Pt denies chest pain now.  ?

## 2022-07-21 ENCOUNTER — Emergency Department (HOSPITAL_COMMUNITY): Payer: Medicare Other

## 2022-07-21 ENCOUNTER — Other Ambulatory Visit: Payer: Self-pay

## 2022-07-21 ENCOUNTER — Encounter (HOSPITAL_COMMUNITY): Payer: Self-pay | Admitting: Emergency Medicine

## 2022-07-21 ENCOUNTER — Emergency Department (HOSPITAL_COMMUNITY)
Admission: EM | Admit: 2022-07-21 | Discharge: 2022-07-22 | Disposition: A | Discharge status: Home / Self Care | Payer: Medicare Other | Attending: Emergency Medicine | Admitting: Emergency Medicine

## 2022-07-21 DIAGNOSIS — M25552 Pain in left hip: Secondary | ICD-10-CM | POA: Diagnosis present

## 2022-07-21 DIAGNOSIS — Z79899 Other long term (current) drug therapy: Secondary | ICD-10-CM | POA: Insufficient documentation

## 2022-07-21 DIAGNOSIS — Z7982 Long term (current) use of aspirin: Secondary | ICD-10-CM | POA: Insufficient documentation

## 2022-07-21 DIAGNOSIS — M25562 Pain in left knee: Secondary | ICD-10-CM | POA: Diagnosis not present

## 2022-07-21 DIAGNOSIS — M25561 Pain in right knee: Secondary | ICD-10-CM | POA: Diagnosis not present

## 2022-07-21 DIAGNOSIS — W19XXXA Unspecified fall, initial encounter: Secondary | ICD-10-CM | POA: Diagnosis not present

## 2022-07-21 LAB — URINALYSIS, ROUTINE W REFLEX MICROSCOPIC
Bilirubin Urine: NEGATIVE
Glucose, UA: NEGATIVE mg/dL
Hgb urine dipstick: NEGATIVE
Ketones, ur: 5 mg/dL — AB
Leukocytes,Ua: NEGATIVE
Nitrite: NEGATIVE
Protein, ur: NEGATIVE mg/dL
Specific Gravity, Urine: 1.011 (ref 1.005–1.030)
pH: 7 (ref 5.0–8.0)

## 2022-07-21 LAB — CBC WITH DIFFERENTIAL/PLATELET
Abs Immature Granulocytes: 0.02 10*3/uL (ref 0.00–0.07)
Basophils Absolute: 0 10*3/uL (ref 0.0–0.1)
Basophils Relative: 1 %
Eosinophils Absolute: 0.3 10*3/uL (ref 0.0–0.5)
Eosinophils Relative: 4 %
HCT: 42.2 % (ref 39.0–52.0)
Hemoglobin: 13.6 g/dL (ref 13.0–17.0)
Immature Granulocytes: 0 %
Lymphocytes Relative: 32 %
Lymphs Abs: 2.1 10*3/uL (ref 0.7–4.0)
MCH: 32.2 pg (ref 26.0–34.0)
MCHC: 32.2 g/dL (ref 30.0–36.0)
MCV: 99.8 fL (ref 80.0–100.0)
Monocytes Absolute: 0.6 10*3/uL (ref 0.1–1.0)
Monocytes Relative: 9 %
Neutro Abs: 3.6 10*3/uL (ref 1.7–7.7)
Neutrophils Relative %: 54 %
Platelets: 229 10*3/uL (ref 150–400)
RBC: 4.23 MIL/uL (ref 4.22–5.81)
RDW: 12.4 % (ref 11.5–15.5)
WBC: 6.7 10*3/uL (ref 4.0–10.5)
nRBC: 0 % (ref 0.0–0.2)

## 2022-07-21 LAB — COMPREHENSIVE METABOLIC PANEL
ALT: 11 U/L (ref 0–44)
AST: 14 U/L — ABNORMAL LOW (ref 15–41)
Albumin: 3.4 g/dL — ABNORMAL LOW (ref 3.5–5.0)
Alkaline Phosphatase: 62 U/L (ref 38–126)
Anion gap: 7 (ref 5–15)
BUN: 15 mg/dL (ref 8–23)
CO2: 27 mmol/L (ref 22–32)
Calcium: 9 mg/dL (ref 8.9–10.3)
Chloride: 104 mmol/L (ref 98–111)
Creatinine, Ser: 1.08 mg/dL (ref 0.61–1.24)
GFR, Estimated: >60 mL/min (ref >60)
Glucose, Bld: 100 mg/dL — ABNORMAL HIGH (ref 70–99)
Potassium: 4.1 mmol/L (ref 3.5–5.1)
Sodium: 138 mmol/L (ref 135–145)
Total Bilirubin: 0.7 mg/dL (ref 0.3–1.2)
Total Protein: 6.5 g/dL (ref 6.5–8.1)

## 2022-07-21 MED ORDER — HYDRALAZINE HCL 10 MG PO TABS
10.0000 mg | ORAL_TABLET | Freq: Once | ORAL | Status: DC
Start: 2022-07-21 — End: 2022-07-21

## 2022-07-21 MED ORDER — IRBESARTAN 75 MG PO TABS
75.0000 mg | ORAL_TABLET | Freq: Every day | ORAL | Status: DC
Start: 2022-07-21 — End: 2022-07-21
  Filled 2022-07-21: qty 1

## 2022-07-21 MED ORDER — DOXEPIN HCL 25 MG PO CAPS
75.0000 mg | ORAL_CAPSULE | Freq: Once | ORAL | Status: AC
  Administered 2022-07-22: 75 mg via ORAL
  Filled 2022-07-21: qty 3

## 2022-07-21 MED ORDER — MIRABEGRON ER 50 MG PO TB24
50.0000 mg | ORAL_TABLET | Freq: Every day | ORAL | Status: DC
  Administered 2022-07-21: 50 mg via ORAL
  Filled 2022-07-21 (×2): qty 1

## 2022-07-21 MED ORDER — DOXEPIN HCL 25 MG PO CAPS
75.0000 mg | ORAL_CAPSULE | Freq: Once | ORAL | Status: DC
  Filled 2022-07-21 (×2): qty 1

## 2022-07-21 MED ORDER — DIAZEPAM 5 MG PO TABS
10.0000 mg | ORAL_TABLET | Freq: Once | ORAL | Status: AC
  Administered 2022-07-21: 10 mg via ORAL
  Filled 2022-07-21: qty 2

## 2022-07-21 MED ORDER — BUPROPION HCL ER (SR) 150 MG PO TB12
150.0000 mg | ORAL_TABLET | Freq: Once | ORAL | Status: AC
  Administered 2022-07-21: 150 mg via ORAL
  Filled 2022-07-21: qty 1

## 2022-07-21 NOTE — ED Provider Notes (Signed)
Informed that patient's BP is elevated.  Patient did not take BP medications prior to arrival.  Patient has BP medication with him.  Advised patient to take BP medication.  No headache, visual changes, speech changes, unilateral weakness.   Suzy Bouchard, PA-C 07/21/22 1738    Dorie Rank, MD 07/23/22 1436

## 2022-07-21 NOTE — ED Notes (Signed)
Called pt's wife and updated wife that pt is up for DC and will be taking PTAR home.

## 2022-07-21 NOTE — ED Provider Notes (Signed)
[facility] EMERGENCY DEPARTMENT Provider Note   CSN: [id number] Arrival date & time: 07/21/22  1203     History  No chief complaint on file.   Lance [name] is a 88 y.o. male who presents with his wife at the bedside with concern  For left hip pain persisting since yesterday.  Patient at baseline is nonambulatory and requires 2 person assist to transfer from bed to wheelchair.  His wife states that yesterday she was in the kitchen and the patient tried to get out of bed and walk on his own at which time he fell onto his buttocks next to the bed.  She denies head trauma or change in his mental status since that time.  Patient's wife states that he has been complaining of left hip pain and resisting encouragement to bear weight on the leg since the fall and his PCP prompted ED visit.  No falls today.  Patient providing little insight to his history, somewhat confused which his wife states is normal for him in the evenings. Patient with private home health aide x 4 hours daily at home.   I personally reviewed his medical records.  Has history of CVA, hypertension osteoarthritis of the knees bilaterally and obstructive sleep apnea.  He is not on any anticoagulation. Patient was noted to be extremely hypertensive on the waiting room.  Triage provider notified and patient's wife administered evening dose of valsartan; patient typically on 40 mg twice daily, however this evening patient's wife administered the whole 80 mg tablet on their waiting room due to acuity of hypertension.  HPI     Home Medications Prior to Admission medications   Medication Sig Start Date End Date Taking? Authorizing Provider  Ascorbic Acid (VITAMIN C PO) Take 1 tablet by mouth daily.    [provider]  aspirin 325 MG EC tablet Take 325 mg by mouth daily.    [provider]  buPROPion (WELLBUTRIN SR) 150 MG 12 hr tablet Take 150 mg by mouth 2 (two) times daily.    [provider]  CALCIUM CARBONATE PO Take 1 tablet by mouth daily.    [provider]  Cyanocobalamin (VITAMIN B12 PO) Take 1 tablet by mouth daily.    [provider]  diazepam (VALIUM) 10 MG tablet Take 10 mg by mouth every 8 (eight) hours as needed for anxiety.    [provider]  doxepin (SINEQUAN) 75 MG capsule Take 75 mg by mouth at bedtime.    [provider]  ezetimibe (ZETIA) 10 MG tablet Take 10 mg by mouth daily.    [provider]  finasteride (PROSCAR) 5 MG tablet Take 5 mg by mouth daily.    [provider]  lidocaine (LIDODERM) 5 % Place 1 patch onto the skin daily. Remove & Discard patch within 12 hours or as directed by MD 11/29/21   Tacy Learn, PA-C  mirabegron ER (MYRBETRIQ) 50 MG TB24 tablet Take 50 mg by mouth daily.    [provider]  Multiple Vitamin (MULTIVITAMIN) capsule Take 1 capsule by mouth daily.     [provider]  Omega-3 Fatty Acids (FISH OIL PO) Take 1 capsule by mouth daily.    [provider]  oxyCODONE-acetaminophen (PERCOCET/ROXICET) 5-325 MG tablet Take 1 tablet by mouth every 6 (six) hours as needed for severe pain. 11/29/21   Tacy Learn, PA-C  Probiotic Product ([place]) Take 1 capsule by mouth daily.  [provider]  traMADol (ULTRAM) 50 MG tablet Take by mouth every 8 (eight) hours as needed.    [provider]  valsartan (DIOVAN) 40 MG tablet Take 40 mg by mouth 2 (two) times daily.    [provider]      Allergies    Penicillins and Simvastatin    Review of Systems   Review of Systems  Musculoskeletal:        Left hip pain    Physical Exam Updated Vital Signs BP (!) 175/81 (BP Location: Right Arm)   Pulse 78   Temp 98.8 F (37.1 C)   Resp 15   SpO2 96%  Physical Exam Vitals and nursing note reviewed.  Constitutional:      Appearance: He is not ill-appearing or toxic-appearing.  HENT:     Head:  Normocephalic and atraumatic.     Mouth/Throat:     Mouth: Mucous membranes are moist.     Pharynx: No oropharyngeal exudate or posterior oropharyngeal erythema.  Eyes:     General:        Right eye: No discharge.        Left eye: No discharge.     Extraocular Movements: Extraocular movements intact.     Conjunctiva/sclera: Conjunctivae normal.     Pupils: Pupils are equal, round, and reactive to light.  Cardiovascular:     Rate and Rhythm: Normal rate and regular rhythm.     Pulses: Normal pulses.     Heart sounds: Normal heart sounds. No murmur heard. Pulmonary:     Effort: Pulmonary effort is normal. No respiratory distress.     Breath sounds: Normal breath sounds. No wheezing or rales.  Abdominal:     General: Bowel sounds are normal. There is no distension.     Palpations: Abdomen is soft.     Tenderness: There is no abdominal tenderness. There is no guarding or rebound.  Musculoskeletal:        General: No deformity.     Cervical back: Neck supple.     Right hip: Normal.     Left hip: Normal.     Right upper leg: Normal.     Left upper leg: Normal.     Right knee: Bony tenderness present. No swelling or deformity.     Left knee: Bony tenderness present. No swelling or deformity.     Right lower leg: Normal.     Left lower leg: Normal.     Right ankle: Normal.     Right Achilles Tendon: Normal.     Left ankle: Normal.     Left Achilles Tendon: Normal.     Right foot: Normal.     Left foot: Normal.  Skin:    General: Skin is warm and dry.     Capillary Refill: Capillary refill takes less than 2 seconds.  Neurological:     General: No focal deficit present.     Mental Status: He is alert. Mental status is at baseline.  Psychiatric:        Mood and Affect: Mood normal.     ED Results / Procedures / Treatments   Labs (all labs ordered are listed, but only abnormal results are displayed) Labs Reviewed  COMPREHENSIVE METABOLIC PANEL - Abnormal; Notable for the  following components:      Result Value   Glucose, Bld 100 (*)    Albumin 3.4 (*)    AST 14 (*)    All other components within normal limits  URINALYSIS, ROUTINE W REFLEX MICROSCOPIC - Abnormal; Notable for the following components:   Ketones, ur 5 (*)    All other components within normal limits  CBC WITH DIFFERENTIAL/PLATELET    EKG None  Radiology CT Lumbar Spine Wo Contrast  Result Date: 07/21/2022 CLINICAL DATA:  Persistent low back pain after 6 weeks of treatment. EXAM: CT LUMBAR SPINE WITHOUT CONTRAST TECHNIQUE: Multidetector CT imaging of the lumbar spine was performed without intravenous contrast administration. Multiplanar CT image reconstructions were also generated. RADIATION DOSE REDUCTION: This exam was performed according to the departmental dose-optimization program which includes automated exposure control, adjustment of the mA and/or kV according to patient size and/or use of iterative reconstruction technique. COMPARISON:  Lumbar spine radiographs 01/03/2018, abdominopelvic CT 11/29/2021 and lumbar MRI 10/23/2015. FINDINGS: Segmentation: There are 5 lumbar type vertebral bodies. Alignment: Moderate convex left scoliosis centered at L4, measuring approximately 23 degrees. The lateral alignment is normal. Vertebrae: The bones are demineralized. No evidence of acute fracture, suspicious lesion or pars defect. Paraspinal and other soft tissues: No acute paraspinal findings. Diffuse aortic and branch vessel atherosclerosis without evidence of aneurysm. Disc levels: L1-2: Preserved disc height with disc bulging and endplate osteophytes asymmetric to the left. Mild facet and ligamentous hypertrophy. No significant spinal stenosis or nerve root encroachment. L2-3: Chronic degenerative disc disease with loss of disc height, circumferential endplate osteophytes and vacuum phenomenon. Resulting moderate multifactorial spinal stenosis with lateral recess narrowing bilaterally. The foramina  are sufficiently patent. Overall appearance is similar to remote MRI. L3-4: Chronic degenerative disc disease with loss of disc height and endplate osteophytes asymmetric to the right. Facet and ligamentous hypertrophy. Stable mild spinal stenosis with asymmetric right lateral recess narrowing. L4-5: Interval posterior decompression. Advanced loss of disc height with annular disc bulging, endplate osteophytes and vacuum phenomenon. Improved patency of the spinal canal. Moderate lateral recess and foraminal narrowing bilaterally appears similar to previous MRI. L5-S1: Relatively preserved disc height with mild endplate osteophyte formation asymmetric to the left. Chronic bilateral facet hypertrophy. No significant spinal stenosis or nerve root encroachment. IMPRESSION: 1. Interval posterior decompression at L4-5 with improved patency of the spinal canal. There is moderate lateral recess and foraminal narrowing bilaterally which appears similar to previous MRI. 2. Chronic multilevel spondylosis with moderate multifactorial spinal stenosis at L2-3 and mild spinal stenosis at L3-4, similar to previous MRI. 3. No acute osseous findings.  Moderate scoliosis. 4.  Aortic Atherosclerosis (ICD10-I70.0). Electronically Signed   By: Richardean Sale M.D.   On: 07/21/2022 13:20   CT PELVIS WO CONTRAST  Result Date: 07/21/2022 CLINICAL DATA:  Trauma.  Hip fracture suspected. EXAM: CT PELVIS WITHOUT CONTRAST TECHNIQUE: Multidetector CT imaging of the pelvis was performed following the standard protocol without intravenous contrast. RADIATION DOSE REDUCTION: This exam was performed according to the departmental dose-optimization program which includes automated exposure control, adjustment of the mA and/or kV according to patient size and/or use of iterative reconstruction technique. COMPARISON:  Abdomen and pelvis CT, 11/29/2021. FINDINGS: Urinary Tract: Normal bladder. Distal ureters are normal in course and in caliber.  Bowel: Numerous sigmoid colon diverticula. No evidence of diverticulitis. No bowel dilation, wall thickening or inflammation. Vascular/Lymphatic: Dense aortoiliac atherosclerotic calcifications. No enlarged lymph nodes. Reproductive:  Prominent prostate, 4.8 x 4.0 cm transversely. Other:  None. Musculoskeletal: No fracture. No bone lesion. Right greater than left hip joint arthropathic changes with moderate right concentric hip joint space narrowing and small marginal osteophytes from the base of the right femoral head. Mild superior acetabular  subchondral cystic change. IMPRESSION: 1. No fracture or acute finding. 2. Right greater than left hip joint arthropathic changes stable compared to the prior abdomen and pelvis CT. Electronically Signed   By: Lajean Manes M.D.   On: 07/21/2022 13:09    Procedures Procedures    Medications Ordered in ED Medications  mirabegron ER (MYRBETRIQ) tablet 50 mg (50 mg Oral Given 07/21/22 2334)  doxepin (SINEQUAN) capsule 75 mg (has no administration in time range)  diazepam (VALIUM) tablet 10 mg (10 mg Oral Given 07/21/22 2306)  buPROPion (WELLBUTRIN SR) 12 hr tablet 150 mg (150 mg Oral Given 07/21/22 2306)    ED Course/ Medical Decision Making/ A&P Clinical Course as of 07/22/22 0004  Tue Jul 21, 2022  2257 Per patient's wife, he got a dose of valsartan 80 mg this evening while in the waiting room, after BP was 211/95. Will hold off on further BP medication at this time.  [RS]    Clinical Course User Index [RS] Mikenzi Raysor, Gypsy Balsam, PA-C                           Medical Decision Making 88 year old male who presents for left hip pain persisting from fall yesterday.    Hypertensive on intake, currently hypertensive Thursday in the waiting room improved with his evening dose of oral valsartan back to his baseline.  Cardiopulmonary exams unremarkable, abdominal exam is benign.  Musculoskeletal exam also unremarkable without evidence of trauma or pelvic  instability.  Amount and/or Complexity of Data Reviewed Labs:     Details: CBC without leukocytosis or anemia, CMP unremarkable, UA without evidence of infection. Radiology:     Details:   CT of the L-spine with stable degenerative changes but overall improved appearance following decompression L4-L5.  No acute osseous findings.  CT pelvis with arthropathic changes but no acute fracture or dislocations.  Images visualized this provider.  Risk Prescription drug management.     Overall patient's work-up is reassuring.  No evidence of acute injury or indication for admission at this time.  Per patient's wife family preferences strongly to discharge home.  Will consult social work for home health aide and PT.  We will also request that social worker discuss SNF placement options with patient's family during phone call. Per patient's wife he is at his baseline, unable to ambulate and requiring 2 person assist to stand and transfer.  This is unchanged in the ED tonight.  No further work-up warranted in the ER at this time.  Clinical concern for emergent underlying etiology or finding that would warrant further ED work-up or inpatient management is exceedingly low.  Elias and his wife  voiced understanding of his medical evaluation and treatment plan. Each of their questions answered to their expressed satisfaction.  Return precautions were given.  Patient is well-appearing, stable, and was discharged in good condition.  Patient awaiting PTAR for transfer home.   This chart was dictated using voice recognition software, Dragon. Despite the best efforts of this provider to proofread and correct errors, errors may still occur which can change documentation meaning.          Final Clinical Impression(s) / ED Diagnoses Final diagnoses:  Pain of left hip    Rx / DC Orders ED Discharge Orders          [place]        07/21/22 2351    Face-to-face encounter (required  for Medicare/Medicaid patients)       Comments: I Emeline Darling certify that this patient is under my care and that I, or a nurse practitioner or physician's assistant working with me, had a face-to-face encounter that meets the physician face-to-face encounter requirements with this patient on 07/21/2022. The encounter with the patient was in whole, or in part for the following medical condition(s) which is the primary reason for home health care (List medical condition):  Patient with baseline weakness and arthritis in the knees, unable to ambulate independently, requires 2 person assist for transfers. Patient's family would like to trial home health/PT assistance before pursuing SNF placement.   07/21/22 2351              Konya Fauble, Gypsy Balsam, PA-C 07/22/22 0004    Gareth Morgan, MD 07/22/22 1642

## 2022-07-21 NOTE — ED Provider Triage Note (Cosign Needed Addendum)
Emergency Medicine Provider Triage Evaluation Note  Lance [name] , a 88 y.o. male  was evaluated in triage.  Pt complains of pain in right side neck and around waist, couldn't get out of bed today. Poor historian. Reports laminectomy. No abdominal pain.  Call to patient's wife who states that patient has a comfort care aide who was in the house today, patient's wife and the aide were unable to get him out of bed, states that he reported pain in his left hip when he tried to stand.  He has not fallen recently. Review of Systems  Positive: As above Negative: As above  Physical Exam  BP (!) 186/97 (BP Location: Right Arm)   Pulse 73   Temp 98.1 F (36.7 C) (Oral)   Resp 18   SpO2 93%  Gen:   Awake, no distress   Resp:  Normal effort  MSK:   Moves extremities without difficulty no pain with range of motion of hips bilaterally or with knees. Other:    Medical Decision Making  Medically screening exam initiated at 12:32 PM.  Appropriate orders placed.  Lance [name] was informed that the remainder of the evaluation will be completed by another provider, this initial triage assessment does not replace that evaluation, and the importance of remaining in the ED until their evaluation is complete.     Tacy Learn, PA-C 07/21/22 1220    Tacy Learn, PA-C 07/21/22 1232

## 2022-07-21 NOTE — ED Triage Notes (Addendum)
Pt arrives via EMS from home- left hip pain. Pt fell out of bed per report. Pt is not ambulatory at baseline per EMS, but his wife states he has been trying to get up and walk lately. No obvious injury per EMS.  BP 170/90, HR 72, 95% on room air.

## 2022-07-21 NOTE — ED Notes (Signed)
Patient family giving 500 mg of home tylenol to patient.

## 2022-07-21 NOTE — ED Notes (Signed)
Pt placed on purewick. Pt very unsteady on feet, can't hold himself up.

## 2022-07-21 NOTE — ED Notes (Signed)
2 person assisted in standing pt up at bedside. Pt very unsteady while standing. Pt unable to ambulate. Provider notified.

## 2022-07-22 MED ORDER — LORAZEPAM 1 MG PO TABS
1.0000 mg | ORAL_TABLET | Freq: Once | ORAL | Status: AC
  Administered 2022-07-22: 1 mg via ORAL
  Filled 2022-07-22: qty 1

## 2022-10-01 ENCOUNTER — Emergency Department (HOSPITAL_COMMUNITY): Payer: Medicare Other

## 2022-10-01 ENCOUNTER — Inpatient Hospital Stay (HOSPITAL_COMMUNITY)
Admission: EM | Admit: 2022-10-01 | Discharge: 2022-10-07 | DRG: 534 | Disposition: A | Discharge status: Hospice | Payer: Medicare Other | Attending: Internal Medicine | Admitting: Internal Medicine

## 2022-10-01 ENCOUNTER — Other Ambulatory Visit: Payer: Self-pay

## 2022-10-01 ENCOUNTER — Encounter (HOSPITAL_COMMUNITY): Payer: Self-pay

## 2022-10-01 DIAGNOSIS — Z79899 Other long term (current) drug therapy: Secondary | ICD-10-CM

## 2022-10-01 DIAGNOSIS — M25462 Effusion, left knee: Secondary | ICD-10-CM | POA: Diagnosis present

## 2022-10-01 DIAGNOSIS — Z7982 Long term (current) use of aspirin: Secondary | ICD-10-CM

## 2022-10-01 DIAGNOSIS — Z83719 Family history of colon polyps, unspecified: Secondary | ICD-10-CM

## 2022-10-01 DIAGNOSIS — G4733 Obstructive sleep apnea (adult) (pediatric): Secondary | ICD-10-CM | POA: Diagnosis present

## 2022-10-01 DIAGNOSIS — H409 Unspecified glaucoma: Secondary | ICD-10-CM | POA: Diagnosis present

## 2022-10-01 DIAGNOSIS — F32A Depression, unspecified: Secondary | ICD-10-CM | POA: Diagnosis present

## 2022-10-01 DIAGNOSIS — I10 Essential (primary) hypertension: Secondary | ICD-10-CM | POA: Diagnosis present

## 2022-10-01 DIAGNOSIS — Z87891 Personal history of nicotine dependence: Secondary | ICD-10-CM

## 2022-10-01 DIAGNOSIS — E440 Moderate protein-calorie malnutrition: Secondary | ICD-10-CM | POA: Insufficient documentation

## 2022-10-01 DIAGNOSIS — M545 Low back pain, unspecified: Secondary | ICD-10-CM | POA: Diagnosis present

## 2022-10-01 DIAGNOSIS — Z6827 Body mass index (BMI) 27.0-27.9, adult: Secondary | ICD-10-CM

## 2022-10-01 DIAGNOSIS — K5900 Constipation, unspecified: Secondary | ICD-10-CM | POA: Diagnosis present

## 2022-10-01 DIAGNOSIS — Z66 Do not resuscitate: Secondary | ICD-10-CM | POA: Diagnosis present

## 2022-10-01 DIAGNOSIS — Z96652 Presence of left artificial knee joint: Secondary | ICD-10-CM | POA: Diagnosis present

## 2022-10-01 DIAGNOSIS — S72402A Unspecified fracture of lower end of left femur, initial encounter for closed fracture: Secondary | ICD-10-CM | POA: Diagnosis not present

## 2022-10-01 DIAGNOSIS — W19XXXA Unspecified fall, initial encounter: Secondary | ICD-10-CM | POA: Diagnosis present

## 2022-10-01 DIAGNOSIS — F4310 Post-traumatic stress disorder, unspecified: Secondary | ICD-10-CM | POA: Diagnosis present

## 2022-10-01 DIAGNOSIS — F03B3 Unspecified dementia, moderate, with mood disturbance: Secondary | ICD-10-CM | POA: Diagnosis present

## 2022-10-01 DIAGNOSIS — S7290XA Unspecified fracture of unspecified femur, initial encounter for closed fracture: Secondary | ICD-10-CM | POA: Diagnosis present

## 2022-10-01 DIAGNOSIS — S72492A Other fracture of lower end of left femur, initial encounter for closed fracture: Secondary | ICD-10-CM

## 2022-10-01 DIAGNOSIS — Z888 Allergy status to other drugs, medicaments and biological substances status: Secondary | ICD-10-CM

## 2022-10-01 DIAGNOSIS — Z9049 Acquired absence of other specified parts of digestive tract: Secondary | ICD-10-CM

## 2022-10-01 DIAGNOSIS — Z880 Allergy status to penicillin: Secondary | ICD-10-CM

## 2022-10-01 DIAGNOSIS — E7800 Pure hypercholesterolemia, unspecified: Secondary | ICD-10-CM | POA: Diagnosis present

## 2022-10-01 DIAGNOSIS — Z8719 Personal history of other diseases of the digestive system: Secondary | ICD-10-CM

## 2022-10-01 DIAGNOSIS — M25561 Pain in right knee: Secondary | ICD-10-CM | POA: Diagnosis present

## 2022-10-01 DIAGNOSIS — F03B0 Unspecified dementia, moderate, without behavioral disturbance, psychotic disturbance, mood disturbance, and anxiety: Secondary | ICD-10-CM

## 2022-10-01 DIAGNOSIS — M8580 Other specified disorders of bone density and structure, unspecified site: Secondary | ICD-10-CM | POA: Diagnosis present

## 2022-10-01 DIAGNOSIS — R531 Weakness: Secondary | ICD-10-CM

## 2022-10-01 DIAGNOSIS — N401 Enlarged prostate with lower urinary tract symptoms: Secondary | ICD-10-CM | POA: Diagnosis present

## 2022-10-01 LAB — URINALYSIS, ROUTINE W REFLEX MICROSCOPIC
Bilirubin Urine: NEGATIVE
Glucose, UA: NEGATIVE mg/dL
Hgb urine dipstick: NEGATIVE
Ketones, ur: 5 mg/dL — AB
Leukocytes,Ua: NEGATIVE
Nitrite: NEGATIVE
Protein, ur: NEGATIVE mg/dL
Specific Gravity, Urine: 1.019 (ref 1.005–1.030)
pH: 5 (ref 5.0–8.0)

## 2022-10-01 LAB — CBC
HCT: 43.6 % (ref 39.0–52.0)
Hemoglobin: 13.8 g/dL (ref 13.0–17.0)
MCH: 31.3 pg (ref 26.0–34.0)
MCHC: 31.7 g/dL (ref 30.0–36.0)
MCV: 98.9 fL (ref 80.0–100.0)
Platelets: 230 10*3/uL (ref 150–400)
RBC: 4.41 MIL/uL (ref 4.22–5.81)
RDW: 12.9 % (ref 11.5–15.5)
WBC: 9.7 10*3/uL (ref 4.0–10.5)
nRBC: 0 % (ref 0.0–0.2)

## 2022-10-01 LAB — BASIC METABOLIC PANEL
Anion gap: 9 (ref 5–15)
BUN: 18 mg/dL (ref 8–23)
CO2: 24 mmol/L (ref 22–32)
Calcium: 8.7 mg/dL — ABNORMAL LOW (ref 8.9–10.3)
Chloride: 106 mmol/L (ref 98–111)
Creatinine, Ser: 0.96 mg/dL (ref 0.61–1.24)
GFR, Estimated: >60 mL/min (ref >60)
Glucose, Bld: 118 mg/dL — ABNORMAL HIGH (ref 70–99)
Potassium: 4 mmol/L (ref 3.5–5.1)
Sodium: 139 mmol/L (ref 135–145)

## 2022-10-01 NOTE — ED Provider Notes (Signed)
[facility] EMERGENCY DEPARTMENT Provider Note   CSN: [id number] Arrival date & time: 10/01/22  1418     History  Chief Complaint  Patient presents with   Weakness    Lance [name] is a 89 y.o. male.  HPI   Pt complains of concerns for generalized weakness onset 3 days. Has had increased confusion, family with concerns for UTI due to dark urine. Family notes that patient fell last night and landed on his back.  Family notes that the fall was unwitnessed however they have cameras in the room and came into the room after the fall.  Family unsure if patient hit his head.  Patient has associated lower back pain, bilateral knee pain.  Patient denies chest pain, shortness of breath, dizziness, neck pain, abdominal pain, nausea, vomiting.  Denies anticoagulants at this time.   Family reports they had a lot of difficulty getting the patient back up again.  With both patient's wife and son helping they are having trouble with transferring for bathroom and once the patient fell it was difficult to get him back in the bed.  He has continued to complain of pain and swelling at the left knee since the fall.  Patient's wife reports that he is sometimes oriented but also exhibits confusion and memory loss that she thinks is consistent with early dementia.  They are trying to work with the primary provider to get assistance with care and management.  They were instructed to come to the emergency department for further assessment after the patient's fall.    Home Medications Prior to Admission medications   Medication Sig Start Date End Date Taking? Authorizing Provider  Ascorbic Acid (VITAMIN C PO) Take 1 tablet by mouth daily.    [provider]  aspirin 325 MG EC tablet Take 325 mg by mouth daily.    [provider]  buPROPion (WELLBUTRIN SR) 150 MG 12 hr tablet Take 150 mg by mouth 2 (two) times daily.    [provider]  CALCIUM CARBONATE PO Take 1  tablet by mouth daily.    [provider]  Cyanocobalamin (VITAMIN B12 PO) Take 1 tablet by mouth daily.    [provider]  diazepam (VALIUM) 10 MG tablet Take 10 mg by mouth every 8 (eight) hours as needed for anxiety.    [provider]  doxepin (SINEQUAN) 75 MG capsule Take 75 mg by mouth at bedtime.    [provider]  ezetimibe (ZETIA) 10 MG tablet Take 10 mg by mouth daily.    [provider]  finasteride (PROSCAR) 5 MG tablet Take 5 mg by mouth daily.    [provider]  lidocaine (LIDODERM) 5 % Place 1 patch onto the skin daily. Remove & Discard patch within 12 hours or as directed by MD 11/29/21   Tacy Learn, PA-C  mirabegron ER (MYRBETRIQ) 50 MG TB24 tablet Take 50 mg by mouth daily.    [provider]  Multiple Vitamin (MULTIVITAMIN) capsule Take 1 capsule by mouth daily.     [provider]  Omega-3 Fatty Acids (FISH OIL PO) Take 1 capsule by mouth daily.    [provider]  oxyCODONE-acetaminophen (PERCOCET/ROXICET) 5-325 MG tablet Take 1 tablet by mouth every 6 (six) hours as needed for severe pain. 11/29/21   Tacy Learn, PA-C  Probiotic Product ([place]) Take 1 capsule by mouth daily.    [provider]  traMADol (ULTRAM) 50 MG  tablet Take by mouth every 8 (eight) hours as needed.    [provider]  valsartan (DIOVAN) 40 MG tablet Take 40 mg by mouth 2 (two) times daily.    [provider]      Allergies    Penicillins and Simvastatin    Review of Systems   Review of Systems  Physical Exam Updated Vital Signs BP (!) 182/88   Pulse 88   Temp 98.9 F (37.2 C) (Oral)   Resp 17   Ht 6' (1.829 m)   Wt 90.7 kg   SpO2 95%   BMI 27.12 kg/m  Physical Exam Constitutional:      Comments: Alert.  Nontoxic.  No respiratory distress.  HENT:     Mouth/Throat:     Pharynx: Oropharynx is clear.  Eyes:     Extraocular Movements: Extraocular  movements intact.  Cardiovascular:     Rate and Rhythm: Normal rate and regular rhythm.  Pulmonary:     Effort: Pulmonary effort is normal.     Breath sounds: Normal breath sounds.  Abdominal:     General: There is no distension.     Palpations: Abdomen is soft.     Tenderness: There is no abdominal tenderness. There is no guarding.  Genitourinary:    Penis: Normal.   Musculoskeletal:     Comments: Mild to moderate swelling of the knee on the left.  No abrasions or open wounds.  Distal lower legs soft and nontender without significant peripheral edema.  Patient is able to move both legs and sit at the edge of the bed.  Neurological:     Comments: Patient is alert and answering some questions but seems to have lack of recall and non comments consistent with dementia.  Speech quality is clear and situationally relevant.  Follows simple commands and     ED Results / Procedures / Treatments   Labs (all labs ordered are listed, but only abnormal results are displayed) Labs Reviewed  BASIC METABOLIC PANEL - Abnormal; Notable for the following components:      Result Value   Glucose, Bld 118 (*)    Calcium 8.7 (*)    All other components within normal limits  URINALYSIS, ROUTINE W REFLEX MICROSCOPIC - Abnormal; Notable for the following components:   Ketones, ur 5 (*)    All other components within normal limits  CBC  URINALYSIS, ROUTINE W REFLEX MICROSCOPIC  CBG MONITORING, ED    EKG EKG Interpretation  Date/Time:  Thursday October 01 2022 14:38:09 EST Ventricular Rate:  83 PR Interval:  192 QRS Duration: 86 QT Interval:  382 QTC Calculation: 448 R Axis:   -9 Text Interpretation: Normal sinus rhythm Anterior infarct , age undetermined Abnormal ECG When compared with ECG of 01-Feb-2022 19:07, No significant change was found Confirmed by Delora Fuel ([id number]) on 10/02/2022 12:24:36 AM  Radiology No results found.  Procedures Procedures    Medications Ordered in  ED Medications - No data to display  ED Course/ Medical Decision Making/ A&P                           Medical Decision Making Amount and/or Complexity of Data Reviewed Radiology: ordered.  Risk Decision regarding hospitalization.  He has been experiencing increasing weakness incrementally.  He however had a fall with decreased mobility and severe knee pain.  Proceed with basic lab work and imaging of the knee.  CT Knee shows Undisplaced fracture in  the medial metaphysis of the distal left Associated joint effusion is noted.   Urinalysis negative.  CBC normal.  Basic chemistry panel normal.  Consult: Reviewed with Dr. Stann Mainland. Can admit and assess for function. Knee immobilizer for now.  Patient's baseline function significantly impaired by acute nondisplaced knee fracture.  Will plan for admission for assessment by orthopedics and determination what patient's functional level and orthopedic treatment plan.        Final Clinical Impression(s) / ED Diagnoses Final diagnoses:  Generalized weakness  Moderate dementia, unspecified dementia type, unspecified whether behavioral, psychotic, or mood disturbance or anxiety ([place])  Other closed fracture of distal end of left femur, initial encounter [facility])    Rx / [place] Orders ED Discharge Orders     None         Charlesetta Shanks, MD 10/07/22 Einar Crow

## 2022-10-01 NOTE — Discharge Instructions (Signed)
1.  Work with your family doctor regarding further definitive evaluation and diagnosis for dementia and plan of care. 2.  There is a small nondisplaced fracture of the femur at the knee joint.

## 2022-10-01 NOTE — ED Notes (Signed)
This RN helped patient attempt to urinate. Patient unable to urinate at this time.

## 2022-10-01 NOTE — ED Notes (Signed)
Patient called for vitals

## 2022-10-01 NOTE — ED Provider Triage Note (Signed)
Emergency Medicine Provider Triage Evaluation Note  Lance [name] , a 89 y.o. male  was evaluated in triage.  Pt complains of concerns for generalized weakness onset 3 days. Has had increased confusion, family with concerns for UTI due to dark urine. Family notes that patient fell last night and landed on his back.  Family notes that the fall was unwitnessed however they have cameras in the room and came into the room after the fall.  Family unsure if patient hit his head.  Patient has associated lower back pain, bilateral knee pain.  Patient denies chest pain, shortness of breath, dizziness, neck pain, abdominal pain, nausea, vomiting.  Denies anticoagulants at this time.   Review of Systems  Positive:  Negative:   Physical Exam  BP (!) 167/93 (BP Location: Right Arm)   Pulse 80   Temp 98.3 F (36.8 C) (Oral)   Resp 18   Ht 6' (1.829 m)   Wt 90.7 kg   SpO2 93%   BMI 27.12 kg/m  Gen:   Awake, no distress   Resp:  Normal effort  MSK:   Moves extremities without difficulty  Other:  Spinal tenderness to palpation.  Tenderness to palpation noted to bilateral knees.  Medical Decision Making  Medically screening exam initiated at 3:04 PM.  Appropriate orders placed.  Lance [name] was informed that the remainder of the evaluation will be completed by another provider, this initial triage assessment does not replace that evaluation, and the importance of remaining in the ED until their evaluation is complete.    Tyan Dy A, PA-C 10/01/22 1515

## 2022-10-01 NOTE — ED Triage Notes (Signed)
Pt arrived via [facility] from home for c/o generalized weaknessx3d, decreased mobility. Pt has int confusion per EMS. Family told EMS they believe pt has a UTI and has had dark urine. They also stated he has confusion and they think he's in early stages of dementia, but has not been diagnosed. Per EMS, pt has ankle edema bilat.

## 2022-10-02 ENCOUNTER — Other Ambulatory Visit: Payer: Self-pay

## 2022-10-02 DIAGNOSIS — F03B3 Unspecified dementia, moderate, with mood disturbance: Secondary | ICD-10-CM | POA: Diagnosis present

## 2022-10-02 DIAGNOSIS — E7800 Pure hypercholesterolemia, unspecified: Secondary | ICD-10-CM | POA: Diagnosis present

## 2022-10-02 DIAGNOSIS — Z8719 Personal history of other diseases of the digestive system: Secondary | ICD-10-CM | POA: Diagnosis not present

## 2022-10-02 DIAGNOSIS — Z7982 Long term (current) use of aspirin: Secondary | ICD-10-CM | POA: Diagnosis not present

## 2022-10-02 DIAGNOSIS — F4310 Post-traumatic stress disorder, unspecified: Secondary | ICD-10-CM | POA: Diagnosis present

## 2022-10-02 DIAGNOSIS — S7290XA Unspecified fracture of unspecified femur, initial encounter for closed fracture: Secondary | ICD-10-CM | POA: Diagnosis present

## 2022-10-02 DIAGNOSIS — S72402A Unspecified fracture of lower end of left femur, initial encounter for closed fracture: Secondary | ICD-10-CM | POA: Diagnosis present

## 2022-10-02 DIAGNOSIS — Z96652 Presence of left artificial knee joint: Secondary | ICD-10-CM | POA: Diagnosis present

## 2022-10-02 DIAGNOSIS — N401 Enlarged prostate with lower urinary tract symptoms: Secondary | ICD-10-CM | POA: Diagnosis present

## 2022-10-02 DIAGNOSIS — Z880 Allergy status to penicillin: Secondary | ICD-10-CM | POA: Diagnosis not present

## 2022-10-02 DIAGNOSIS — S7290XD Unspecified fracture of unspecified femur, subsequent encounter for closed fracture with routine healing: Secondary | ICD-10-CM

## 2022-10-02 DIAGNOSIS — F32A Depression, unspecified: Secondary | ICD-10-CM | POA: Diagnosis present

## 2022-10-02 DIAGNOSIS — M8580 Other specified disorders of bone density and structure, unspecified site: Secondary | ICD-10-CM | POA: Diagnosis present

## 2022-10-02 DIAGNOSIS — Z79899 Other long term (current) drug therapy: Secondary | ICD-10-CM | POA: Diagnosis not present

## 2022-10-02 DIAGNOSIS — Z9049 Acquired absence of other specified parts of digestive tract: Secondary | ICD-10-CM | POA: Diagnosis not present

## 2022-10-02 DIAGNOSIS — E440 Moderate protein-calorie malnutrition: Secondary | ICD-10-CM | POA: Insufficient documentation

## 2022-10-02 DIAGNOSIS — Z66 Do not resuscitate: Secondary | ICD-10-CM | POA: Diagnosis present

## 2022-10-02 DIAGNOSIS — G4733 Obstructive sleep apnea (adult) (pediatric): Secondary | ICD-10-CM | POA: Diagnosis present

## 2022-10-02 DIAGNOSIS — S7291XA Unspecified fracture of right femur, initial encounter for closed fracture: Secondary | ICD-10-CM | POA: Diagnosis not present

## 2022-10-02 DIAGNOSIS — I10 Essential (primary) hypertension: Secondary | ICD-10-CM | POA: Diagnosis present

## 2022-10-02 DIAGNOSIS — S72492A Other fracture of lower end of left femur, initial encounter for closed fracture: Secondary | ICD-10-CM | POA: Diagnosis not present

## 2022-10-02 DIAGNOSIS — W19XXXA Unspecified fall, initial encounter: Secondary | ICD-10-CM | POA: Diagnosis present

## 2022-10-02 DIAGNOSIS — Z888 Allergy status to other drugs, medicaments and biological substances status: Secondary | ICD-10-CM | POA: Diagnosis not present

## 2022-10-02 DIAGNOSIS — H409 Unspecified glaucoma: Secondary | ICD-10-CM | POA: Diagnosis present

## 2022-10-02 DIAGNOSIS — M25462 Effusion, left knee: Secondary | ICD-10-CM | POA: Diagnosis present

## 2022-10-02 DIAGNOSIS — K5900 Constipation, unspecified: Secondary | ICD-10-CM | POA: Diagnosis present

## 2022-10-02 DIAGNOSIS — Z83719 Family history of colon polyps, unspecified: Secondary | ICD-10-CM | POA: Diagnosis not present

## 2022-10-02 DIAGNOSIS — M545 Low back pain, unspecified: Secondary | ICD-10-CM | POA: Diagnosis present

## 2022-10-02 DIAGNOSIS — M25561 Pain in right knee: Secondary | ICD-10-CM | POA: Diagnosis present

## 2022-10-02 LAB — VITAMIN D 25 HYDROXY (VIT D DEFICIENCY, FRACTURES): Vit D, 25-Hydroxy: 103.46 ng/mL — ABNORMAL HIGH (ref 30–100)

## 2022-10-02 MED ORDER — ENSURE ENLIVE PO LIQD
237.0000 mL | Freq: Two times a day (BID) | ORAL | Status: DC
  Administered 2022-10-02 – 2022-10-06 (×6): 237 mL via ORAL

## 2022-10-02 MED ORDER — ASPIRIN 325 MG PO TBEC
325.0000 mg | DELAYED_RELEASE_TABLET | Freq: Every day | ORAL | Status: DC
  Administered 2022-10-03 – 2022-10-06 (×4): 325 mg via ORAL
  Filled 2022-10-02 (×6): qty 1

## 2022-10-02 MED ORDER — TRAMADOL HCL 50 MG PO TABS
50.0000 mg | ORAL_TABLET | Freq: Three times a day (TID) | ORAL | Status: DC | PRN
  Administered 2022-10-04: 50 mg via ORAL
  Filled 2022-10-02: qty 1

## 2022-10-02 MED ORDER — ACETAMINOPHEN 325 MG PO TABS
650.0000 mg | ORAL_TABLET | Freq: Four times a day (QID) | ORAL | Status: DC | PRN
  Administered 2022-10-02 – 2022-10-04 (×3): 650 mg via ORAL
  Filled 2022-10-02 (×4): qty 2

## 2022-10-02 MED ORDER — ADULT MULTIVITAMIN W/MINERALS CH
1.0000 | ORAL_TABLET | Freq: Every day | ORAL | Status: DC
  Administered 2022-10-02 – 2022-10-06 (×5): 1 via ORAL
  Filled 2022-10-02 (×6): qty 1

## 2022-10-02 MED ORDER — HYDROCODONE-ACETAMINOPHEN 5-325 MG PO TABS
1.0000 | ORAL_TABLET | Freq: Four times a day (QID) | ORAL | Status: DC | PRN
  Administered 2022-10-02 – 2022-10-04 (×6): 1 via ORAL
  Administered 2022-10-05 – 2022-10-07 (×3): 2 via ORAL
  Filled 2022-10-02 (×2): qty 1
  Filled 2022-10-02: qty 2
  Filled 2022-10-02: qty 1
  Filled 2022-10-02 (×2): qty 2
  Filled 2022-10-02: qty 1
  Filled 2022-10-02 (×2): qty 2

## 2022-10-02 MED ORDER — SODIUM CHLORIDE 0.9 % IV SOLN: INTRAVENOUS | Status: AC

## 2022-10-02 MED ORDER — OXYCODONE-ACETAMINOPHEN 5-325 MG PO TABS: 1.0000 | ORAL_TABLET | Freq: Four times a day (QID) | ORAL | Status: DC | PRN

## 2022-10-02 MED ORDER — DIAZEPAM 5 MG PO TABS
10.0000 mg | ORAL_TABLET | Freq: Three times a day (TID) | ORAL | Status: DC | PRN
  Administered 2022-10-02 – 2022-10-05 (×6): 10 mg via ORAL
  Filled 2022-10-02 (×6): qty 2

## 2022-10-02 MED ORDER — MORPHINE SULFATE (PF) 2 MG/ML IV SOLN
0.5000 mg | INTRAVENOUS | Status: DC | PRN
  Administered 2022-10-02 – 2022-10-03 (×2): 0.5 mg via INTRAVENOUS
  Filled 2022-10-02 (×2): qty 1

## 2022-10-02 MED ORDER — IRBESARTAN 150 MG PO TABS
75.0000 mg | ORAL_TABLET | Freq: Every day | ORAL | Status: DC
  Administered 2022-10-02 – 2022-10-06 (×5): 75 mg via ORAL
  Filled 2022-10-02 (×6): qty 1

## 2022-10-02 NOTE — Evaluation (Signed)
Physical Therapy Evaluation Patient Details Name: Lance [name] MRN: [id number] DOB: 1932/01/15 Today's Date: 10/02/2022  History of Present Illness  Pt is an 89 y/o M admitted on 10/01/22 after presenting with c/o progressive weakness over months & fall resulting in LLE pain. Pt found to have undisplaced fracture in the medial metaphysis of the distal left femur. Dr. Ninfa Linden recommends KI and WBAT LLE for transfers only, otherwise TDWB. PMH: OSA on CPAP, HLD, HTN, depression, anxiety, glaucoma  Clinical Impression  Pt seen for PT evaluation with co-tx with OT for pt & therapists safety.  Pt's wife & son present for session, providing PLOF & home set up information. Pt without KI in room -- asked nurse to f/u on this & MD aware. Pt requires max assist +2 for supine>sit & lateral scoot to drop arm recliner on R. Pt's family reports pt has the beginnings of mild cognitive decline; pt oriented to location & self & follows simple commands with extra time throughout session. At this time recommend STR upon d/c to maximize independence with functional mobility, decrease caregiver burden, & reduce fall risk prior to return home.   Recommendations for follow up therapy are one component of a multi-disciplinary discharge planning process, led by the attending physician.  Recommendations may be updated based on patient status, additional functional criteria and insurance authorization.  Follow Up Recommendations Skilled nursing-short term rehab (<3 hours/day) Can patient physically be transported by private vehicle: No    Assistance Recommended at Discharge Frequent or constant Supervision/Assistance  Patient can return home with the following  Two people to help with walking and/or transfers;Two people to help with bathing/dressing/bathroom;Help with stairs or ramp for entrance;Assist for transportation;Assistance with feeding;Assistance with cooking/housework;Direct supervision/assist for financial  management;Direct supervision/assist for medications management    Equipment Recommendations None recommended by PT  Recommendations for Other Services       Functional Status Assessment Patient has had a recent decline in their functional status and demonstrates the ability to make significant improvements in function in a reasonable and predictable amount of time.     Precautions / Restrictions Precautions Precautions: Fall Required Braces or Orthoses: Knee Immobilizer - Left Restrictions Weight Bearing Restrictions: Yes LLE Weight Bearing: Touchdown weight bearing Other Position/Activity Restrictions: WBAT LLE transfers only, otherwise TDWB      Mobility  Bed Mobility Overal bed mobility: Needs Assistance Bed Mobility: Supine to Sit, Sit to Supine     Supine to sit: Max assist, +2 for physical assistance          Transfers Overall transfer level: Needs assistance Equipment used: None Transfers: Bed to chair/wheelchair/BSC            Lateral/Scoot Transfers: Max assist, +2 physical assistance General transfer comment: Pt assists with anterior weight shifting & clearing bottom but requires assistance for scooting across to chair.    Ambulation/Gait                  Stairs            Wheelchair Mobility    Modified Rankin (Stroke Patients Only)       Balance Overall balance assessment: Needs assistance Sitting-balance support: Feet supported, Bilateral upper extremity supported Sitting balance-Leahy Scale: Poor                                       Pertinent Vitals/Pain Pain Assessment Pain Assessment: Faces Faces  Pain Scale: Hurts even more Pain Location: BLE knees Pain Descriptors / Indicators: Discomfort, Grimacing, Guarding Pain Intervention(s): Monitored during session, Repositioned    Home Living Family/patient expects to be discharged to:: Private residence Living Arrangements: Spouse/significant  other;Children Available Help at Discharge: Family Type of Home: House Home Access: Stairs to enter Entrance Stairs-Rails: Psychiatric nurse of Steps: 3   Home Layout: One level Home Equipment: Conservation officer, nature (2 wheels);Cane - single point;Shower seat;BSC/3in1      Prior Function Prior Level of Function : Needs assist       Physical Assist : Mobility (physical);ADLs (physical)   ADLs (physical): Grooming;Bathing;Dressing;Toileting;IADLs Mobility Comments: Pt has been requiring increased assistance for all mobility beginning since 2017. Pt has declined to the point he requires +1 assist to complete stand pivot transfers without AD. ADLs Comments: max to total A from family     Hand Dominance   Dominant Hand: Right    Extremity/Trunk Assessment   Upper Extremity Assessment Upper Extremity Assessment: Overall WFL for tasks assessed;Difficult to assess due to impaired cognition;Defer to OT evaluation RUE Deficits / Details: AROM to 80 degrees RUE Sensation: WNL RUE Coordination: decreased fine motor;decreased gross motor LUE Deficits / Details: AROM to 80 degrees LUE Sensation: WNL LUE Coordination: decreased fine motor;decreased gross motor    Lower Extremity Assessment Lower Extremity Assessment: Generalized weakness;RLE deficits/detail;LLE deficits/detail RLE Deficits / Details: 2/5 knee extension in sitting LLE Deficits / Details: 2/5 knee extension in sitting    Cervical / Trunk Assessment Cervical / Trunk Assessment: Kyphotic  Communication   Communication: HOH  Cognition Arousal/Alertness: Lethargic Behavior During Therapy: WFL for tasks assessed/performed Overall Cognitive Status: History of cognitive impairments - at baseline                                 General Comments: Pt is oriented to the hospital & his name. Follows simple commands with extra time throughout session.        General Comments General comments (skin  integrity, edema, etc.): Wife and son in room and very supportive    Exercises     Assessment/Plan    PT Assessment Patient needs continued PT services  PT Problem List Decreased strength;Decreased coordination;Pain;Decreased range of motion;Decreased cognition;Decreased activity tolerance;Decreased knowledge of use of DME;Decreased balance;Decreased safety awareness;Decreased mobility;Decreased knowledge of precautions;Decreased skin integrity       PT Treatment Interventions DME instruction;Therapeutic exercise;Gait training;Balance training;Neuromuscular re-education;Stair training;Functional mobility training;Therapeutic activities;Patient/family education    PT Goals (Current goals can be found in the Care Plan section)  Acute Rehab PT Goals Patient Stated Goal: get stronger PT Goal Formulation: With family Time For Goal Achievement: 10/16/22 Potential to Achieve Goals: Fair    Frequency Min 2X/week     Co-evaluation PT/OT/SLP Co-Evaluation/Treatment: Yes Reason for Co-Treatment: Complexity of the patient's impairments (multi-system involvement);Necessary to address cognition/behavior during functional activity;For patient/therapist safety;To address functional/ADL transfers PT goals addressed during session: Mobility/safety with mobility;Balance OT goals addressed during session: ADL's and self-care       AM-PAC PT "6 Clicks" Mobility  Outcome Measure Help needed turning from your back to your side while in a flat bed without using bedrails?: Total Help needed moving from lying on your back to sitting on the side of a flat bed without using bedrails?: Total Help needed moving to and from a bed to a chair (including a wheelchair)?: Total Help needed standing up from a chair using  your arms (e.g., wheelchair or bedside chair)?: Total Help needed to walk in hospital room?: Total Help needed climbing 3-5 steps with a railing? : Total 6 Click Score: 6    End of Session  Equipment Utilized During Treatment: Gait belt Activity Tolerance: Patient tolerated treatment well Patient left: in chair;with call bell/phone within reach;with family/visitor present Nurse Communication: Mobility status PT Visit Diagnosis: Difficulty in walking, not elsewhere classified (R26.2);Muscle weakness (generalized) (M62.81);Pain Pain - Right/Left:  (bilateral) Pain - part of body: Leg    Time: 1400-1427 PT Time Calculation (min) (ACUTE ONLY): 27 min   Charges:   PT Evaluation $PT Eval High Complexity: 1 High          Lavone Nian, PT, DPT 10/02/22, 3:27 PM   Waunita Schooner 10/02/2022, 3:25 PM

## 2022-10-02 NOTE — Progress Notes (Signed)
Mobility Specialist Progress Note   10/02/22 1206  Mobility  Activity Turned to right side;Turned to left side;Turned to back - supine  Level of Assistance +2 (takes two people)  Assistive Device Other (Comment) (HHA)  Activity Response Tolerated fair  $Mobility charge 1 Mobility   NT requesting assistance for pericare. Pt anxious throughout secondary to general pain and weakness. Pt clean and linens changed during process, NT still present in room.  Holland Falling Mobility Specialist Please contact via SecureChat or  Rehab office at [phone]

## 2022-10-02 NOTE — Progress Notes (Signed)
Patient ID: Lance [name], male   DOB: [birth year], 89 y.o.   MRN: [id number] The patient is a former patient of mine that I have not seen in a long period of time.  I do know him though.  He is 89 years old and now has some dementia.  He does still live at home with his wife.  She is at the bedside and I talked her in length in detail about his situation.  I did review the plain films and CT scan.  He has a small nondisplaced fracture of the medial distal femur that is really a nonoperative type of fracture.  This should heal with time.  He is someone who does not ambulate and stands minimally just to pivot in a chair.  On exam he does have a large effusion of his left knee and I was able to aspirate about 50 cc of bloody fluid from the knee consistent with a hemarthrosis and his injury.  I talked to his wife in detail.  Regardless of his mental status, treatment for this type of fracture would be touchdown weightbearing only.  I thought about having him in a knee immobilizer but I really do not think this is needed based on now seeing him at the bedside.  I think it would be more burdensome to try to keep him in a knee immobilizer and is really not necessary.  The hardest part is just maintaining touchdown weightbearing for the next 6 to 8 weeks as the fracture heals.  I would need to see him back in the office in about 2 weeks from now for repeat x-rays of his left knee.  He likely needs some type of short-term rehab or skilled nursing placement.  From orthopedic standpoint, have no further recommendations.  Again I would not put him in any type of knee immobilizer at this point at all.

## 2022-10-02 NOTE — Progress Notes (Signed)
No charge note  Patient seen and examined this morning, admitted overnight, H&P reviewed and agree with the assessment and plan.  In brief, this is an 89 year old male with dementia, OSA, HTN, HLD who with progressive weakness over several months, with a fall at home and left leg pain.  Imaging was concerning for undisplaced fracture in the medial metaphysis of the distal left femur.  Ortho consulted and he was admitted to the hospital  In discussing with the wife at bedside this morning, he has been poorly mobile for quite some time, barely able to get from bed onto the bedside commode.  She seems overwhelmed at times.  Dr. Stann Mainland was consulted overnight, I spoke with him over the phone and it does not look like there is a whole lot of benefit from the surgical approach, but he wanted me to touch base with Dr. Ninfa Linden who is the patient's prior orthopedic surgeon.  Awaiting callback from Dr. Ninfa Linden.  For now, since no surgeries seem to be planned, will let him eat.   Chyla Schlender M. Cruzita Lederer, MD, PhD [facility]  Between 7 am - 7 pm you can contact me via Amion (for emergencies) or [place] (non urgent matters).  I am not available 7 pm - 7 am, please contact night coverage MD/APP via Amion

## 2022-10-02 NOTE — Plan of Care (Signed)
  Problem: Education: Goal: Knowledge of General Education information will improve Description: Including pain rating scale, medication(s)/side effects and non-pharmacologic comfort measures Outcome: Progressing   Problem: Activity: Goal: Risk for activity intolerance will decrease Outcome: Progressing   Problem: Nutrition: Goal: Adequate nutrition will be maintained Outcome: Progressing   

## 2022-10-02 NOTE — Progress Notes (Signed)
Orthopedic Tech Progress Note Patient Details:  Lance [name] [birth year] [id number]  Ortho Devices Type of Ortho Device: Knee Immobilizer Ortho Device/Splint Location: LLE Ortho Device/Splint Interventions: Ordered   Post Interventions Instructions Provided: Care of device Patietns wife requested that I leave the brace in the room, as her husband was having issues with his catheter and they wanted that to be taken care of first. I told her that was fine, and to let the nurse know to contact me if they had issues applying the brace after they assisted him.  Vernona Rieger 10/02/2022, 4:24 PM

## 2022-10-02 NOTE — Progress Notes (Signed)
Initial Nutrition Assessment  DOCUMENTATION CODES:   Non-severe (moderate) malnutrition in context of chronic illness  INTERVENTION:  Automatic house trays Feeding assistance with all meals Ensure Enlive po BID, each supplement provides 350 kcal and 20 grams of protein. Magic cup TID with meals, each supplement provides 290 kcal and 9 grams of protein MVI with minerals daily  NUTRITION DIAGNOSIS:   Moderate Malnutrition related to chronic illness (dementia) as evidenced by moderate fat depletion, moderate muscle depletion, mild muscle depletion.  GOAL:   Patient will meet greater than or equal to 90% of their needs  MONITOR:   PO intake, Supplement acceptance, Labs, Weight trends  REASON FOR ASSESSMENT:   Consult Hip fracture protocol  ASSESSMENT:   Pt admitted with L undisplaced femur fracture. PMH significant for dementia, OSA, HLD, HTN, depression/anxiety  Pending Ortho consult/recommendations. Per wife, considering whether surgery would be beneficial.   Flowsheet documentation reflects pt oriented x1.   Nutrition history obtained via wife and son at bedside. They report that he was eating really well and at his baseline up until about 1 week ago. Since that time, they have noticed a significant decrease in his PO intake. His wife has been assisting with feeding meals as he has been unable to self feed. She also mentions that he drools a lot with meals. They deny observed chewing/swallowing difficulties.   No documented meal completions on file to review during admission. Obtained lunch order from pt's wife and placed order. Pt's family agreeable to house trays and nutrition supplements. Family plans to be here during admission to assist with meal feeding however placed order for assistance in the event they are not here.   Unfortunately, there is limited documentation of weight history on file to review within the last year. Current weight documented to be 33.8 kg,  uncertain whether this is actual or stated weight. Will continue to monitor throughout admission.    Medications reviewed  Labs reviewed Vitamin D 103.46 (H)  NUTRITION - FOCUSED PHYSICAL EXAM:  Flowsheet Row Most Recent Value  Orbital Region Moderate depletion  Upper Arm Region Mild depletion  Thoracic and Lumbar Region No depletion  Buccal Region Moderate depletion  Temple Region Mild depletion  Clavicle Bone Region Mild depletion  Clavicle and Acromion Bone Region Mild depletion  Scapular Bone Region No depletion  Dorsal Hand Severe depletion  Patellar Region Moderate depletion  Anterior Thigh Region Moderate depletion  Posterior Calf Region Mild depletion  Edema (RD Assessment) None  Hair Reviewed  Eyes Reviewed  Mouth Reviewed  Skin Reviewed  Nails Reviewed      Diet Order:   Diet Order             Diet regular Fluid consistency: Thin  Diet effective now                   EDUCATION NEEDS:   Education needs have been addressed  Skin:  Skin Assessment: Reviewed RN Assessment  Last BM:  PTA  Height:   Ht Readings from Last 1 Encounters:  10/01/22 6' (1.829 m)    Weight:   Wt Readings from Last 1 Encounters:  10/01/22 90.7 kg   BMI:  Body mass index is 27.12 kg/m.  Estimated Nutritional Needs:   Kcal:  1950-2150  Protein:  110-125g  Fluid:  >/=2L  Clayborne Dana, RDN, LDN Clinical Nutrition

## 2022-10-02 NOTE — H&P (Signed)
History and Physical    Lance [name] OEH:[id number] DOB: October 01, 1931 DOA: 10/01/2022  PCP: Crist Infante, MD  Patient coming from: home  I have personally briefly reviewed patient's old medical records in [place]  Chief Complaint: fall with left leg pain   HPI: Lance [name] is a 89 y.o. male with medical history significant of  OSA on CPAP,,HLD, HTN , Depression/Anxeity ,who presents to ED with progressive weakness over months and fall and home with left leg pain. Patient currently notes pain under control. He notes no n/v/d/ sob/chest pain/fever/ chills/ or cough.   ED Course:  Afeb , BP 167/93,  hr 80, rr 18 at 93%  EKG: NSR  no hyperacute st -twave changes  Wbc 9.7, hgb 13.8, plt 230 N a 139, K 4, gly 118, cr0.96  UA -neg   CTH: MPRESSION: 1. No acute intracranial pathology. Small-vessel white matter disease. 2. No fracture or static subluxation of the cervical spine. 3. Moderate to severe multilevel cervical disc degenerative disease. DG right knee MPRESSION: Tricompartment arthritis with severe medial tibiofemoral joint space disease. DG left knee   IMPRESSION: 1. Status post left medial hemiarthroplasty. Mild lateral and moderate patellofemoral degenerative change with small knee effusion. 2. Question cortex buckling/fracture at the distal metaphysis of the femur.  Undisplaced fracture in the medial metaphysis of the distal left femur consistent with that seen on prior plain film examination. Associated joint effusion is noted.   Degenerative changes without acute abnormality. Review of Systems: As per HPI otherwise 10 point review of systems negative.   Past Medical History:  Diagnosis Date   Alcohol abuse    Remote hx, stopped in 1970   Anxiety    BPH (benign prostatic hypertrophy) with urinary obstruction    Colon polyps    Depression    Diverticulosis    Glaucoma    Headache    sinus headaches   Hypercholesterolemia     Hypertension    Hypogonadism male    OSA on CPAP    Osteoarthritis    Osteopenia    Sleep apnea    Vitamin D deficiency     Past Surgical History:  Procedure Laterality Date   APPENDECTOMY  1945   BACK SURGERY     EYE SURGERY     bilateral cataracts   LUMBAR LAMINECTOMY/DECOMPRESSION MICRODISCECTOMY Bilateral 11/11/2015   Procedure: Bilateral L4-5 Laminectomy;  Surgeon: Kary Kos, MD;  Location: [facility];  Service: Neurosurgery;  Laterality: Bilateral;  Bilateral L4-5 Laminectomy   PARTIAL KNEE ARTHROPLASTY Left 02/25/2016   Procedure: LEFT UNICOMPARTMENTAL KNEE ARTHROPLASTY;  Surgeon: Mcarthur Rossetti, MD;  Location: [facility];  Service: Orthopedics;  Laterality: Left;   REPLACEMENT UNICONDYLAR JOINT KNEE Left 02/25/2016   SHOULDER SURGERY Right    TONSILLECTOMY       reports that he quit smoking about 60 years ago. His smoking use included cigarettes. He has never used smokeless tobacco. He reports that he does not currently use alcohol. He reports that he does not use drugs.  Allergies  Allergen Reactions   Penicillins Anaphylaxis, Hives and Other (See Comments)    Has patient had a PCN reaction causing immediate rash, facial/tongue/throat swelling, SOB or lightheadedness with hypotension: Yes Has patient had a PCN reaction causing severe rash involving mucus membranes or skin necrosis: No Has patient had a PCN reaction that required hospitalization Yes Has patient had a PCN reaction occurring within the last 10 years: No If all of the above answers are "NO",  then may proceed with Cephalosporin use.    Simvastatin Other (See Comments)    Leg weakness    Family History  Problem Relation Age of Onset   Colon polyps Son    Colon polyps Daughter    Colon polyps Son    Stroke Neg Hx    Colon cancer Neg Hx     Prior to Admission medications   Medication Sig Start Date End Date Taking? Authorizing Provider  aspirin 325 MG EC tablet Take 325 mg by mouth daily.   Yes  [provider]  buPROPion (WELLBUTRIN SR) 150 MG 12 hr tablet Take 150 mg by mouth 2 (two) times daily.   Yes [provider]  diazepam (VALIUM) 10 MG tablet Take 10 mg by mouth every 8 (eight) hours as needed for anxiety.   Yes [provider]  Ascorbic Acid (VITAMIN C PO) Take 1 tablet by mouth daily.    [provider]  CALCIUM CARBONATE PO Take 1 tablet by mouth daily.    [provider]  Cyanocobalamin (VITAMIN B12 PO) Take 1 tablet by mouth daily.    [provider]  doxepin (SINEQUAN) 75 MG capsule Take 75 mg by mouth at bedtime.    [provider]  ezetimibe (ZETIA) 10 MG tablet Take 10 mg by mouth daily.    [provider]  finasteride (PROSCAR) 5 MG tablet Take 5 mg by mouth daily.    [provider]  lidocaine (LIDODERM) 5 % Place 1 patch onto the skin daily. Remove & Discard patch within 12 hours or as directed by MD 11/29/21   Tacy Learn, PA-C  mirabegron ER (MYRBETRIQ) 50 MG TB24 tablet Take 50 mg by mouth daily.    [provider]  Multiple Vitamin (MULTIVITAMIN) capsule Take 1 capsule by mouth daily.     [provider]  Omega-3 Fatty Acids (FISH OIL PO) Take 1 capsule by mouth daily.    [provider]  oxyCODONE-acetaminophen (PERCOCET/ROXICET) 5-325 MG tablet Take 1 tablet by mouth every 6 (six) hours as needed for severe pain. 11/29/21   Tacy Learn, PA-C  Probiotic Product ([place]) Take 1 capsule by mouth daily.    [provider]  traMADol (ULTRAM) 50 MG tablet Take by mouth every 8 (eight) hours as needed.    [provider]  valsartan (DIOVAN) 40 MG tablet Take 40 mg by mouth 2 (two) times daily.    [provider]    Physical Exam: Vitals:   10/01/22 2330 10/01/22 2345 10/02/22 0230 10/02/22 0233  BP: (!) 185/80 (!) 182/88 (!) 169/83   Pulse: 84 88 (!) 103 99  Resp: '14 17 12   '$ Temp:    99.9 F (37.7 C)   TempSrc:    Oral  SpO2: 94% 95% 93%   Weight:      Height:        Constitutional: NAD, calm, comfortable Vitals:   10/01/22 2330 10/01/22 2345 10/02/22 0230 10/02/22 0233  BP: (!) 185/80 (!) 182/88 (!) 169/83   Pulse: 84 88 (!) 103 99  Resp: '14 17 12   '$ Temp:    99.9 F (37.7 C)  TempSrc:    Oral  SpO2: 94% 95% 93%   Weight:      Height:       Eyes: PERRL, lids and conjunctivae normal ENMT: Mucous membranes are moist. Posterior pharynx clear of any exudate or lesions.Normal dentition.  Neck: normal, supple, no  masses, no thyromegaly Respiratory: clear to auscultation bilaterally, no wheezing, no crackles. Normal respiratory effort. No accessory muscle use.  Cardiovascular: Regular rate and rhythm, no murmurs / rubs / gallops. No extremity edema. 2+ pedal pulses. No carotid bruits.  Abdomen: no tenderness, no masses palpated. No hepatosplenomegaly. Bowel sounds positive.  Musculoskeletal: no clubbing / cyanosis. No joint deformity upper and lower extremities. Good ROM, no contractures. Normal muscle tone.  Skin: no rashes, lesions, ulcers. No induration Neurologic: CN 2-12 grossly intact. Sensation intact, DTR normal. Strength 5/5 in all 4.  Psychiatric: Normal judgment and insight. Alert and oriented x 3. Normal mood.    Labs on Admission: I have personally reviewed following labs and imaging studies  CBC: Recent Labs  Lab 10/01/22 1530  WBC 9.7  HGB 13.8  HCT 43.6  MCV 98.9  PLT 595   Basic Metabolic Panel: Recent Labs  Lab 10/01/22 1530  NA 139  K 4.0  CL 106  CO2 24  GLUCOSE 118*  BUN 18  CREATININE 0.96  CALCIUM 8.7*   GFR: Estimated Creatinine Clearance: 58.4 mL/min (by C-G formula based on SCr of 0.96 mg/dL). Liver Function Tests: No results for input(s): "AST", "ALT", "ALKPHOS", "BILITOT", "PROT", "ALBUMIN" in the last 168 hours. No results for input(s): "LIPASE", "AMYLASE" in the last 168 hours. No results for input(s): "AMMONIA" in the last 168  hours. Coagulation Profile: No results for input(s): "INR", "PROTIME" in the last 168 hours. Cardiac Enzymes: No results for input(s): "CKTOTAL", "CKMB", "CKMBINDEX", "TROPONINI" in the last 168 hours. BNP (last 3 results) No results for input(s): "PROBNP" in the last 8760 hours. HbA1C: No results for input(s): "HGBA1C" in the last 72 hours. CBG: No results for input(s): "GLUCAP" in the last 168 hours. Lipid Profile: No results for input(s): "CHOL", "HDL", "LDLCALC", "TRIG", "CHOLHDL", "LDLDIRECT" in the last 72 hours. Thyroid Function Tests: No results for input(s): "TSH", "T4TOTAL", "FREET4", "T3FREE", "THYROIDAB" in the last 72 hours. Anemia Panel: No results for input(s): "VITAMINB12", "FOLATE", "FERRITIN", "TIBC", "IRON", "RETICCTPCT" in the last 72 hours. Urine analysis:    Component Value Date/Time   COLORURINE YELLOW 10/01/2022 2112   APPEARANCEUR CLEAR 10/01/2022 2112   LABSPEC 1.019 10/01/2022 2112   PHURINE 5.0 10/01/2022 2112   GLUCOSEU NEGATIVE 10/01/2022 2112   HGBUR NEGATIVE 10/01/2022 2112   BILIRUBINUR NEGATIVE 10/01/2022 2112   KETONESUR 5 (A) 10/01/2022 2112   PROTEINUR NEGATIVE 10/01/2022 2112   UROBILINOGEN 0.2 06/03/2013 1033   NITRITE NEGATIVE 10/01/2022 2112   LEUKOCYTESUR NEGATIVE 10/01/2022 2112    Radiological Exams on Admission: CT Knee Left Wo Contrast  Result Date: 10/01/2022 CLINICAL DATA:  Recent fall with left knee pain and possible metaphyseal fracture EXAM: CT OF THE LEFT KNEE WITHOUT CONTRAST TECHNIQUE: Multidetector CT imaging of the left knee was performed according to the standard protocol. Multiplanar CT image reconstructions were also generated. RADIATION DOSE REDUCTION: This exam was performed according to the departmental dose-optimization program which includes automated exposure control, adjustment of the mA and/or kV according to patient size and/or use of iterative reconstruction technique. COMPARISON:  Plain film from earlier in the  same day. FINDINGS: Bones/Joint/Cartilage There is an undisplaced fracture in the medial metaphysis of the distal left femur. Osteophytic changes are noted laterally as well as in the patellofemoral space. Joint effusion is seen in the suprapatellar bursa. No other fractures are noted. Partial medial knee replacement is noted. Ligaments Suboptimally assessed by CT. Muscles and Tendons Surrounding musculature appears within normal limits. Soft tissues  Diffuse vascular calcifications are noted. Joint effusion is noted as previously described. No other soft tissue abnormality is seen. IMPRESSION: Undisplaced fracture in the medial metaphysis of the distal left femur consistent with that seen on prior plain film examination. Associated joint effusion is noted. Degenerative changes without acute abnormality. Electronically Signed   By: Inez Catalina M.D.   On: 10/01/2022 22:21   DG Knee Complete 4 Views Left  Result Date: 10/01/2022 CLINICAL DATA:  Bilateral knee pain EXAM: LEFT KNEE - COMPLETE 4+ VIEW COMPARISON:  01/08/2020 FINDINGS: Status post left medial hemiarthroplasty. Malalignment. Mild lateral degenerative change and moderate patellofemoral degenerative change. Small knee effusion. Vascular calcifications. Question cortex buckling at the distal metaphysis of the femur. IMPRESSION: 1. Status post left medial hemiarthroplasty. Mild lateral and moderate patellofemoral degenerative change with small knee effusion. 2. Question cortex buckling/fracture at the distal metaphysis of the femur. Electronically Signed   By: Donavan Foil M.D.   On: 10/01/2022 16:35   DG Knee Complete 4 Views Right  Result Date: 10/01/2022 CLINICAL DATA:  Knee pain EXAM: RIGHT KNEE - COMPLETE 4+ VIEW COMPARISON:  01/08/2020 FINDINGS: No fracture or malalignment. 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 No sizable effusion. Vascular calcifications IMPRESSION: Tricompartment arthritis with severe  medial tibiofemoral joint space disease. Electronically Signed   By: Donavan Foil M.D.   On: 10/01/2022 16:32   CT Lumbar Spine Wo Contrast  Result Date: 10/01/2022 CLINICAL DATA:  Back trauma EXAM: CT LUMBAR SPINE WITHOUT CONTRAST TECHNIQUE: Multidetector CT imaging of the lumbar spine was performed without intravenous contrast administration. Multiplanar CT image reconstructions were also generated. RADIATION DOSE REDUCTION: This exam was performed according to the departmental dose-optimization program which includes automated exposure control, adjustment of the mA and/or kV according to patient size and/or use of iterative reconstruction technique. COMPARISON:  CT lumbar spine 07/21/2022 FINDINGS: Segmentation: Normal segmentation.  Lowest disc space L5-S1 Alignment: Normal sagittal alignment. Moderate levoscoliosis at L3-4. Vertebrae: Negative for fracture or mass Paraspinal and other soft tissues: Extensive atherosclerotic calcification in the aorta and iliac arteries. Infrarenal abdominal aorta measures 3.2 cm in diameter. Cholelithiasis.  No retroperitoneal adenopathy or mass. Disc levels: T12-L1: Negative for stenosis L1-2: Negative for stenosis L2-3: Disc and facet degeneration. Endplate spurring. Mild spinal stenosis and mild subarticular stenosis bilaterally L3-4: Disc and facet degeneration. Moderate subarticular stenosis on the right and mild subarticular stenosis on the left. Mild central canal stenosis L4-5: Decompressive laminectomy. Spinal canal adequate in diameter. Bilateral facet hypertrophy. Disc degeneration and diffuse endplate spurring. Severe subarticular and foraminal stenosis bilaterally L5-S1: Bilateral facet degeneration.  Negative for stenosis. IMPRESSION: 1. Negative for lumbar fracture. 2. Multilevel disc and facet degeneration. Mild spinal stenosis L2-3. Moderate subarticular stenosis on the right L3-4. 3. Decompressive laminectomy L4-5. Severe subarticular and foraminal  stenosis bilaterally due to endplate spurring. 4. 3.2 cm abdominal aortic aneurysm. Recommend follow-up every 3 years. This recommendation follows ACR consensus guidelines: White Paper of the ACR Incidental Findings Committee II on Vascular Findings. J Am Coll Radiol 2013; 10:789-794. 5. Cholelithiasis. 6. Aortic atherosclerosis. Aortic Atherosclerosis (ICD10-I70.0). Electronically Signed   By: Franchot Gallo M.D.   On: 10/01/2022 15:52   CT HEAD WO CONTRAST  Result Date: 10/01/2022 CLINICAL DATA:  Altered mental status head and neck trauma EXAM: CT HEAD WITHOUT CONTRAST CT CERVICAL SPINE WITHOUT CONTRAST TECHNIQUE: Multidetector CT imaging of the head and cervical spine was performed following the standard protocol without intravenous contrast. Multiplanar CT image reconstructions of the  cervical spine were also generated. RADIATION DOSE REDUCTION: This exam was performed according to the departmental dose-optimization program which includes automated exposure control, adjustment of the mA and/or kV according to patient size and/or use of iterative reconstruction technique. COMPARISON:  None Available. FINDINGS: CT HEAD FINDINGS Brain: No evidence of acute infarction, hemorrhage, hydrocephalus, extra-axial collection or mass lesion/mass effect. Periventricular and deep white matter hypodensity. Vascular: No hyperdense vessel or unexpected calcification. Skull: Normal. Negative for fracture or focal lesion. Sinuses/Orbits: No acute finding. Other: None. CT CERVICAL SPINE FINDINGS Alignment: Normal. Skull base and vertebrae: No acute fracture. No primary bone lesion or focal pathologic process. Soft tissues and spinal canal: No prevertebral fluid or swelling. No visible canal hematoma. Disc levels: Moderate to severe multilevel disc space height loss and osteophytosis, as well as congenital ankylosis of C2-C3, disc degenerative disease most severe at C4-C5. Upper chest: Negative. Other: None. IMPRESSION: 1. No  acute intracranial pathology. Small-vessel white matter disease. 2. No fracture or static subluxation of the cervical spine. 3. Moderate to severe multilevel cervical disc degenerative disease. Electronically Signed   By: Delanna Ahmadi M.D.   On: 10/01/2022 15:50   CT Cervical Spine Wo Contrast  Result Date: 10/01/2022 CLINICAL DATA:  Altered mental status head and neck trauma EXAM: CT HEAD WITHOUT CONTRAST CT CERVICAL SPINE WITHOUT CONTRAST TECHNIQUE: Multidetector CT imaging of the head and cervical spine was performed following the standard protocol without intravenous contrast. Multiplanar CT image reconstructions of the cervical spine were also generated. RADIATION DOSE REDUCTION: This exam was performed according to the departmental dose-optimization program which includes automated exposure control, adjustment of the mA and/or kV according to patient size and/or use of iterative reconstruction technique. COMPARISON:  None Available. FINDINGS: CT HEAD FINDINGS Brain: No evidence of acute infarction, hemorrhage, hydrocephalus, extra-axial collection or mass lesion/mass effect. Periventricular and deep white matter hypodensity. Vascular: No hyperdense vessel or unexpected calcification. Skull: Normal. Negative for fracture or focal lesion. Sinuses/Orbits: No acute finding. Other: None. CT CERVICAL SPINE FINDINGS Alignment: Normal. Skull base and vertebrae: No acute fracture. No primary bone lesion or focal pathologic process. Soft tissues and spinal canal: No prevertebral fluid or swelling. No visible canal hematoma. Disc levels: Moderate to severe multilevel disc space height loss and osteophytosis, as well as congenital ankylosis of C2-C3, disc degenerative disease most severe at C4-C5. Upper chest: Negative. Other: None. IMPRESSION: 1. No acute intracranial pathology. Small-vessel white matter disease. 2. No fracture or static subluxation of the cervical spine. 3. Moderate to severe multilevel cervical  disc degenerative disease. Electronically Signed   By: Delanna Ahmadi M.D.   On: 10/01/2022 15:50    EKG: Independently reviewed. See above   Assessment/Plan   Undisplaced fracture in the medial metaphysis of the distal left -s/p mechanical fall  -Ortho Dr Stann Mainland consulted will see in am  - supportive pain medication -PT/OT  OSA on CPAP  HLD -diet controlled   Mild Cognitive Impairment   HTN  -resume  amlodipine Jasmine Awe    BPH  -continue tamsulosin -finasteride    PTSD/Depression/Anxiety  -continue valium  -bupropion 150 daily    DVT prophylaxis: scd Code Status: DNR / as discussed per wifein event of cardiac arrest Family Communication:   Alternate Contact Person    [place] (Spouse) [phone] ([place])   Disposition Plan: patient  expected to be admitted greater than 2 midnights  Consults called: DR Stann Mainland ortho Admission status: med/surg   Clance Boll MD [facility]   If  7PM-7AM, please contact night-coverage [email or web address] Password [email or web address]  10/02/2022, 4:27 AM

## 2022-10-02 NOTE — ED Provider Notes (Signed)
I have discussed the case with Dr. Marcello Moores of [facility], who agrees to admit the patient.   Delora Fuel, MD 73/22/56 Laureen Abrahams

## 2022-10-02 NOTE — Evaluation (Signed)
Occupational Therapy Evaluation Patient Details Name: Lance [name] MRN: [id number] DOB: 1932/09/11 Today's Date: 10/02/2022   History of Present Illness Pt is an 89 y/o M admitted on 10/01/22 after presenting with c/o progressive weakness over months & fall resulting in LLE pain. Pt found to have undisplaced fracture in the medial metaphysis of the distal left femur. Dr. Ninfa Linden recommends KI and WBAT LLE for transfers only, otherwise TDWB. PMH: OSA on CPAP, HLD, HTN, depression, anxiety, glaucoma   Clinical Impression   Pt presents to OT with deficits in processing speed, balance, pain, and generalized deconditioning. PTA he was requiring a lot of assist with ADLs and transfers and had progressively gotten weaker over the last couple of months. Max A +2 for lateral scoot d/t not having KI in room. Pt would benefit from continued acute OT services to facilitate safe d/c home and optimize occupational performance. Recommend SNF at d/c.      Recommendations for follow up therapy are one component of a multi-disciplinary discharge planning process, led by the attending physician.  Recommendations may be updated based on patient status, additional functional criteria and insurance authorization.   Follow Up Recommendations  Skilled nursing-short term rehab (<3 hours/day)     Assistance Recommended at Discharge Frequent or constant Supervision/Assistance  Patient can return home with the following Two people to help with walking and/or transfers;Two people to help with bathing/dressing/bathroom    Functional Status Assessment  Patient has had a recent decline in their functional status and demonstrates the ability to make significant improvements in function in a reasonable and predictable amount of time.  Equipment Recommendations  None recommended by OT    Recommendations for Other Services       Precautions / Restrictions Precautions Precautions: Fall Required Braces or Orthoses:  Knee Immobilizer - Left Restrictions Weight Bearing Restrictions: Yes LLE Weight Bearing: Touchdown weight bearing Other Position/Activity Restrictions: WBAT LLE transfers only, otherwise TDWB      Mobility Bed Mobility Overal bed mobility: Needs Assistance Bed Mobility: Supine to Sit, Sit to Supine     Supine to sit: +2 for physical assistance, Max assist Sit to supine: Max assist, +2 for physical assistance        Transfers Overall transfer level: Needs assistance Equipment used: None Transfers: Bed to chair/wheelchair/BSC            Lateral/Scoot Transfers: Max assist, +2 physical assistance        Balance Overall balance assessment: Needs assistance Sitting-balance support: Feet supported, Bilateral upper extremity supported Sitting balance-Leahy Scale: Poor (poor to fair) Sitting balance - Comments: Occasional posterior support needed, inadequate trunk control to accept challenges.                                   ADL either performed or assessed with clinical judgement   ADL Overall ADL's : Needs assistance/impaired Eating/Feeding: Minimal assistance   Grooming: Minimal assistance   Upper Body Bathing: Moderate assistance;Sitting   Lower Body Bathing: Maximal assistance;+2 for physical assistance;Sitting/lateral leans   Upper Body Dressing : Maximal assistance   Lower Body Dressing: Total assistance;+2 for physical assistance;Sitting/lateral leans   Toilet Transfer: Maximal assistance;+2 for physical assistance;+2 for safety/equipment (lateral scoot)   Toileting- Clothing Manipulation and Hygiene: +2 for physical assistance;Total assistance;Sitting/lateral lean       Functional mobility during ADLs: +2 for physical assistance;Maximal assistance General ADL Comments: No KI in room so completed lateral  scoot to the recliner, max A +2     Vision Baseline Vision/History: 1 Wears glasses Ability to See in Adequate Light: 0  Adequate Patient Visual Report: No change from baseline Vision Assessment?: No apparent visual deficits     Perception     Praxis      Pertinent Vitals/Pain Pain Assessment Pain Assessment: Faces Faces Pain Scale: Hurts whole lot Pain Descriptors / Indicators: Aching, Sharp Pain Intervention(s): Repositioned, Monitored during session, Limited activity within patient's tolerance     Hand Dominance Right   Extremity/Trunk Assessment Upper Extremity Assessment Upper Extremity Assessment: Overall WFL for tasks assessed;Difficult to assess due to impaired cognition;Defer to OT evaluation RUE Deficits / Details: AROM to 80 degrees RUE Sensation: WNL RUE Coordination: decreased fine motor;decreased gross motor LUE Deficits / Details: AROM to 80 degrees LUE Sensation: WNL LUE Coordination: decreased fine motor;decreased gross motor   Lower Extremity Assessment Lower Extremity Assessment: Generalized weakness;RLE deficits/detail;LLE deficits/detail RLE Deficits / Details: 2/5 knee extension in sitting LLE Deficits / Details: 2/5 knee extension in sitting   Cervical / Trunk Assessment Cervical / Trunk Assessment: Kyphotic   Communication Communication Communication: HOH   Cognition Arousal/Alertness: Lethargic Behavior During Therapy: WFL for tasks assessed/performed Overall Cognitive Status: History of cognitive impairments - at baseline                                 General Comments: Slow processing, family reports memory deficits that have gradually worsened     General Comments  Wife and son in room and very supportive    Exercises     Shoulder Instructions      Home Living Family/patient expects to be discharged to:: Private residence Living Arrangements: Spouse/significant other;Children Available Help at Discharge: Family Type of Home: House Home Access: Stairs to enter Technical brewer of Steps: 3 Entrance Stairs-Rails: Right;Left Home  Layout: One level     Bathroom Shower/Tub: Occupational psychologist: Standard Bathroom Accessibility: Yes   Home Equipment: Conservation officer, nature (2 wheels);Cane - single point;Shower seat;BSC/3in1          Prior Functioning/Environment Prior Level of Function : Needs assist       Physical Assist : Mobility (physical);ADLs (physical)   ADLs (physical): Grooming;Bathing;Dressing;Toileting;IADLs Mobility Comments: Pt has been requiring increased assistance for all mobility beginning since 2017. Pt has declined to the point he requires +1 assist to complete stand pivot transfers without AD. ADLs Comments: max to total A from family        OT Problem List: Decreased strength;Impaired balance (sitting and/or standing);Decreased cognition;Decreased knowledge of precautions;Decreased range of motion;Decreased safety awareness;Pain;Decreased activity tolerance;Decreased coordination;Decreased knowledge of use of DME or AE      OT Treatment/Interventions: Self-care/ADL training;DME and/or AE instruction;Therapeutic activities;Balance training;Therapeutic exercise;Cognitive remediation/compensation;Energy conservation;Patient/family education    OT Goals(Current goals can be found in the care plan section) Acute Rehab OT Goals Patient Stated Goal: None stated OT Goal Formulation: With patient/family Time For Goal Achievement: 10/16/22 Potential to Achieve Goals: Fair ADL Goals Pt Will Perform Eating: with supervision Pt Will Perform Upper Body Bathing: with mod assist Pt Will Perform Lower Body Bathing: with mod assist Pt Will Perform Upper Body Dressing: with min assist Pt Will Perform Lower Body Dressing: with max assist Pt Will Transfer to Toilet: with mod assist  OT Frequency: Min 2X/week    Co-evaluation PT/OT/SLP Co-Evaluation/Treatment: Yes Reason for Co-Treatment: Complexity of the patient's impairments (  multi-system involvement);Necessary to address cognition/behavior  during functional activity;For patient/therapist safety;To address functional/ADL transfers PT goals addressed during session: Mobility/safety with mobility;Balance OT goals addressed during session: ADL's and self-care      AM-PAC OT "6 Clicks" Daily Activity     Outcome Measure Help from another person eating meals?: A Little Help from another person taking care of personal grooming?: A Little Help from another person toileting, which includes using toliet, bedpan, or urinal?: Total Help from another person bathing (including washing, rinsing, drying)?: A Lot Help from another person to put on and taking off regular upper body clothing?: A Lot Help from another person to put on and taking off regular lower body clothing?: A Lot 6 Click Score: 13   End of Session Equipment Utilized During Treatment: Gait belt Nurse Communication: Mobility status (need for KI)  Activity Tolerance: Patient limited by pain Patient left: in chair;with call bell/phone within reach;with family/visitor present  OT Visit Diagnosis: Muscle weakness (generalized) (M62.81);Repeated falls (R29.6);Unsteadiness on feet (R26.81);Pain Pain - Right/Left: Left Pain - part of body: Knee                Time: 8938-1017 OT Time Calculation (min): 29 min Charges:  OT General Charges $OT Visit: 1 Visit OT Evaluation $OT Eval Moderate Complexity: 1 Mod  Laverle Hobby, OTR/L, CBIS Acute Rehab Office: [phone]   Curtis Sites 10/02/2022, 3:26 PM

## 2022-10-03 DIAGNOSIS — S7290XD Unspecified fracture of unspecified femur, subsequent encounter for closed fracture with routine healing: Secondary | ICD-10-CM | POA: Diagnosis not present

## 2022-10-03 LAB — BASIC METABOLIC PANEL
Anion gap: 10 (ref 5–15)
BUN: 8 mg/dL (ref 8–23)
CO2: 22 mmol/L (ref 22–32)
Calcium: 8.6 mg/dL — ABNORMAL LOW (ref 8.9–10.3)
Chloride: 108 mmol/L (ref 98–111)
Creatinine, Ser: 0.74 mg/dL (ref 0.61–1.24)
GFR, Estimated: >60 mL/min (ref >60)
Glucose, Bld: 104 mg/dL — ABNORMAL HIGH (ref 70–99)
Potassium: 3.3 mmol/L — ABNORMAL LOW (ref 3.5–5.1)
Sodium: 140 mmol/L (ref 135–145)

## 2022-10-03 LAB — CBC
HCT: 40.5 % (ref 39.0–52.0)
Hemoglobin: 13.9 g/dL (ref 13.0–17.0)
MCH: 32.3 pg (ref 26.0–34.0)
MCHC: 34.3 g/dL (ref 30.0–36.0)
MCV: 94.2 fL (ref 80.0–100.0)
Platelets: 216 10*3/uL (ref 150–400)
RBC: 4.3 MIL/uL (ref 4.22–5.81)
RDW: 13 % (ref 11.5–15.5)
WBC: 10.2 10*3/uL (ref 4.0–10.5)
nRBC: 0 % (ref 0.0–0.2)

## 2022-10-03 LAB — MAGNESIUM: Magnesium: 2 mg/dL (ref 1.7–2.4)

## 2022-10-03 MED ORDER — SENNOSIDES-DOCUSATE SODIUM 8.6-50 MG PO TABS
2.0000 | ORAL_TABLET | Freq: Once | ORAL | Status: AC
  Administered 2022-10-03: 2 via ORAL
  Filled 2022-10-03: qty 2

## 2022-10-03 MED ORDER — POTASSIUM CHLORIDE CRYS ER 20 MEQ PO TBCR
40.0000 meq | EXTENDED_RELEASE_TABLET | Freq: Once | ORAL | Status: AC
  Administered 2022-10-03: 40 meq via ORAL
  Filled 2022-10-03: qty 2

## 2022-10-03 MED ORDER — HYDRALAZINE HCL 20 MG/ML IJ SOLN
10.0000 mg | Freq: Four times a day (QID) | INTRAMUSCULAR | Status: AC | PRN
  Administered 2022-10-03: 10 mg via INTRAVENOUS
  Filled 2022-10-03: qty 1

## 2022-10-03 NOTE — Plan of Care (Signed)
  Problem: Education: Goal: Knowledge of General Education information will improve Description: Including pain rating scale, medication(s)/side effects and non-pharmacologic comfort measures Outcome: Progressing   Problem: Health Behavior/Discharge Planning: Goal: Ability to manage health-related needs will improve Outcome: Progressing   Problem: Activity: Goal: Risk for activity intolerance will decrease Outcome: Progressing   

## 2022-10-03 NOTE — TOC Progression Note (Signed)
Transition of Care [facility]) - Progression Note    Patient Details  Name: Lance [name] MRN: [id number] Date of Birth: 1932/04/03  Transition of Care [facility]) CM/SW [place], [place] Phone Number: 10/03/2022, 12:11 PM  Clinical Narrative:     RE: [name]  Date of Birth: 2031-12-06 Date: 10/03/22  Please be advised that the above-named patient will require a short-term nursing home stay - anticipated 30 days or less for rehabilitation and strengthening.  The plan is for return home.   Expected Discharge Plan: [place] Barriers to Discharge: Continued Medical Work up  Expected Discharge Plan and Services In-house Referral: Clinical Social Work     Living arrangements for the past 2 months: Single Family Home                                       Social Determinants of Health (SDOH) Interventions [place]: No Food Insecurity (10/02/2022)  Housing: Low Risk  (10/02/2022)  Transportation Needs: No Transportation Needs (10/02/2022)  Utilities: Not At Risk (10/02/2022)  Tobacco Use: Medium Risk (10/01/2022)    Readmission Risk Interventions     No data to display

## 2022-10-03 NOTE — Progress Notes (Signed)
PROGRESS NOTE  Lance [name] OYD:[id number] DOB: [birth year] DOA: 10/01/2022 PCP: Crist Infante, MD   LOS: 1 day   Brief Narrative / Interim history: 89 year old male with dementia, OSA, HTN, HLD who with progressive weakness over several months, with a fall at home and left leg pain. Imaging was concerning for undisplaced fracture in the medial metaphysis of the distal left femur. Ortho consulted and he was admitted to the hospital   Subjective / 24h Interval events: He is doing well this morning.  Confused.  Wife is at bedside.  He is about to eat breakfast.  He has no complaints  Assesement and Plan: Principal problem Undisplaced fracture in the medial metaphysis of the distal left - s/p mechanical fall.  Orthopedic surgery consulted, appreciate input.  Discussed with Dr. Ninfa Linden, he evaluated patient on 1/5 and recommends nonoperative management.  PT recommends SNF, placement pending he will need to be touchdown weightbearing for the next 6 to 8 weeks as the fracture heals  Active problems OSA - on CPAP   HLD - diet controlled    Mild Cognitive Impairment - stable    HTN - resume ARB   BPH -continue tamsulosin   PTSD/Depression/Anxiety -continue valium, medication reconciliation not done despite patient being admitted for more than 24 hours, awaiting pharmacy to reconcile   Scheduled Meds:  aspirin EC  325 mg Oral Daily   feeding supplement  237 mL Oral BID BM   irbesartan  75 mg Oral Daily   multivitamin with minerals  1 tablet Oral Daily   Continuous Infusions: PRN Meds:.acetaminophen, diazepam, HYDROcodone-acetaminophen, morphine injection, traMADol  Current Outpatient Medications  Medication Instructions   acetaminophen (TYLENOL) 500 mg, Oral, Every 8 hours PRN   Ascorbic Acid (VITAMIN C PO) 500 mg, Oral, 3 times weekly   aspirin EC 325 mg, Oral, Daily   bisacodyl (BISACODYL) 5 mg, Oral, Daily PRN   buPROPion (WELLBUTRIN SR) 150 mg, Oral, 2 times daily    CALCIUM CARBONATE PO 1 tablet, Oral, Daily   celecoxib (CELEBREX) 200 mg, Oral, Daily   diazepam (VALIUM) 10 mg, Oral, Every 8 hours PRN   dorzolamide-timolol (COSOPT) 2-0.5 % ophthalmic solution 1 drop, Both Eyes, 2 times daily   doxepin (SINEQUAN) 75 mg, Oral, Daily at bedtime   ezetimibe (ZETIA) 10 mg, Daily   finasteride (PROSCAR) 5 mg, Oral, Daily   HYDROcodone-acetaminophen (NORCO/VICODIN) 5-325 MG tablet 1-2 tablets, Oral, Every 8 hours PRN   lidocaine (LIDODERM) 5 % 1 patch, Transdermal, Every 24 hours, Remove & Discard patch within 12 hours or as directed by MD   mirabegron ER (MYRBETRIQ) 50 mg, Oral, Daily   Multiple Vitamin (MULTIVITAMIN) capsule 1 capsule, Oral, Daily   Omega-3 Fatty Acids (FISH OIL PO) 1,000 mg, Oral, 3 times weekly   oxyCODONE-acetaminophen (PERCOCET/ROXICET) 5-325 MG tablet 1 tablet, Oral, Every 6 hours PRN   Probiotic Product (PHILLIPS COLON HEALTH PO) 1 capsule, Oral, Daily   tamsulosin (FLOMAX) 0.4 mg, Oral, Daily at bedtime   traMADol (ULTRAM) 50 MG tablet Every 8 hours PRN   valsartan (DIOVAN) 40 mg, Oral, 2 times daily   Vitamin B12 1,000 mcg, Oral, 3 times weekly   Vitamin D (Ergocalciferol) (DRISDOL) 50,000 Units, Oral, Weekly    Diet Orders (From admission, onward)     Start     Ordered   10/02/22 0923  Diet regular Fluid consistency: Thin  Diet effective now       Question:  Fluid consistency:  Answer:  Thin   10/02/22  8280            DVT prophylaxis: SCDs Start: 10/02/22 0450   Lab Results  Component Value Date   PLT 216 10/03/2022      Code Status: DNR  Family Communication: wife at bedside   Status is: Inpatient  Remains inpatient appropriate because: severity of illness  Level of care: Med-Surg  Consultants:  Orthopedic surgery   Objective: Vitals:   10/02/22 2239 10/03/22 0139 10/03/22 0446 10/03/22 0740  BP: (!) 183/90 (!) 193/100 (!) 171/89 (!) 175/100  Pulse: 86 92 100 (!) 105  Resp: '18 16 16 16  '$ Temp: 98  F (36.7 C) 98.6 F (37 C) 98.6 F (37 C) 98.4 F (36.9 C)  TempSrc: Oral Oral Oral Oral  SpO2: 95% 94% 94% 95%  Weight:      Height:        Intake/Output Summary (Last 24 hours) at 10/03/2022 0930 Last data filed at 10/02/2022 1900 Gross per 24 hour  Intake 680.13 ml  Output 400 ml  Net 280.13 ml   Wt Readings from Last 3 Encounters:  10/01/22 90.7 kg  02/01/22 90.7 kg  11/29/21 97 kg    Examination:  Constitutional: NAD Eyes: no scleral icterus ENMT: Mucous membranes are moist.  Neck: normal, supple Respiratory: clear to auscultation bilaterally, no wheezing, no crackles. Normal respiratory effort. No accessory muscle use.  Cardiovascular: Regular rate and rhythm, no murmurs / rubs / gallops. No LE edema.  Abdomen: non distended, no tenderness. Bowel sounds positive.  Musculoskeletal: no clubbing / cyanosis.   Data Reviewed: I have independently reviewed following labs and imaging studies   CBC Recent Labs  Lab 10/01/22 1530 10/03/22 0345  WBC 9.7 10.2  HGB 13.8 13.9  HCT 43.6 40.5  PLT 230 216  MCV 98.9 94.2  MCH 31.3 32.3  MCHC 31.7 34.3  RDW 12.9 13.0    Recent Labs  Lab 10/01/22 1530 10/03/22 0345  NA 139 140  K 4.0 3.3*  CL 106 108  CO2 24 22  GLUCOSE 118* 104*  BUN 18 8  CREATININE 0.96 0.74  CALCIUM 8.7* 8.6*  MG  --  2.0    ------------------------------------------------------------------------------------------------------------------ No results for input(s): "CHOL", "HDL", "LDLCALC", "TRIG", "CHOLHDL", "LDLDIRECT" in the last 72 hours.  Lab Results  Component Value Date   HGBA1C 5.4 06/04/2013   ------------------------------------------------------------------------------------------------------------------ No results for input(s): "TSH", "T4TOTAL", "T3FREE", "THYROIDAB" in the last 72 hours.  Invalid input(s): "FREET3"  Cardiac Enzymes No results for input(s): "CKMB", "TROPONINI", "MYOGLOBIN" in the last 168 hours.  Invalid  input(s): "CK" ------------------------------------------------------------------------------------------------------------------ No results found for: "BNP"  CBG: No results for input(s): "GLUCAP" in the last 168 hours.  No results found for this or any previous visit (from the past 240 hour(s)).   Radiology Studies: No results found.   Marzetta Board, MD, PhD [facility]  Between 7 am - 7 pm I am available, please contact me via Amion (for emergencies) or Securechat (non urgent messages)  Between 7 pm - 7 am I am not available, please contact night coverage MD/APP via Amion

## 2022-10-03 NOTE — TOC Initial Note (Signed)
Transition of Care [facility]) - Initial/Assessment Note    Patient Details  Name: Lance [name] MRN: [id number] Date of Birth: 03-08-1932  Transition of Care [place]) CM/SW Contact:    [address], [place] Phone Number: 10/03/2022, 11:28 AM  Clinical Narrative:                  CSW received consult for SNF, spoke with pt's spouse, she is agreeable for pt placement when pt is medically ready. Spouse would like pt to remain in [place] close to her. Insurance auth and [employer]gov ratings information provided. All questions answered.         Patient Goals and CMS Choice            Expected Discharge Plan and Services                                              Prior Living Arrangements/Services                       Activities of Daily Living Home Assistive Devices/Equipment: Wheelchair ADL Screening (condition at time of admission) Patient's cognitive ability adequate to safely complete daily activities?: No Is the patient deaf or have difficulty hearing?: No Does the patient have difficulty seeing, even when wearing glasses/contacts?: No Does the patient have difficulty concentrating, remembering, or making decisions?: Yes Patient able to express need for assistance with ADLs?: No Does the patient have difficulty dressing or bathing?: No Independently performs ADLs?: No Communication: Independent Dressing (OT): Dependent Is this a change from baseline?: Change from baseline, expected to last >3 days Grooming: Dependent Is this a change from baseline?: Change from baseline, expected to last >3 days Feeding: Dependent Is this a change from baseline?: Change from baseline, expected to last >3 days Bathing: Dependent Is this a change from baseline?: Change from baseline, expected to last >3 days Toileting: Dependent Is this a change from baseline?: Change from baseline, expected to last >3days In/Out Bed: Dependent Is this a change from  baseline?: Change from baseline, expected to last >3 days Walks in Home: Dependent Is this a change from baseline?: Change from baseline, expected to last >3 days Does the patient have difficulty walking or climbing stairs?: No Weakness of Legs: Both Weakness of Arms/Hands: Both  Permission Sought/Granted                  Emotional Assessment              Admission diagnosis:  Femur fracture ([place]) [S72.90XA] Generalized weakness [R53.1] Other closed fracture of distal end of left femur, initial encounter ([place]) [S72.492A] Moderate dementia, unspecified dementia type, unspecified whether behavioral, psychotic, or mood disturbance or anxiety ([place]) [F03.B0] Patient Active Problem List   Diagnosis Date Noted   Femur fracture ([place]) 10/02/2022   Malnutrition of moderate degree 10/02/2022   Pes anserinus bursitis of left knee 03/01/2017   Primary osteoarthritis of right knee 02/15/2017   Dizziness 10/28/2016   Amnestic MCI (mild cognitive impairment with memory loss) 04/27/2016   Osteoarthritis of left knee 02/25/2016   S/P left unicompartmental knee replacement 02/25/2016   Spinal stenosis at L4-L5 level 11/11/2015   Aspiration pneumonia of left lower lobe due to gastric secretions ([place]) 10/22/2015   Bilateral leg weakness 10/22/2015   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   OSA treated with BiPAP 10/22/2015  Ambulatory dysfunction 10/22/2015   CVA (cerebral infarction) 06/03/2013   Syncope 06/03/2013   Facial laceration 06/03/2013   Acute bronchitis 06/03/2013   Hypertension    PCP:  Crist Infante, MD Pharmacy:   [id number] [place] [place] [place] [address]Phone: [phone]: [phone]    Social Determinants of Health (SDOH) Social History: [place] No Food Insecurity (10/02/2022)  Housing: Low Risk  (10/02/2022)  Transportation Needs: No Transportation Needs (10/02/2022)  Utilities: Not At  Risk (10/02/2022)  Tobacco Use: Medium Risk (10/01/2022)   SDOH Interventions:     Readmission Risk Interventions     No data to display

## 2022-10-03 NOTE — NC FL2 (Signed)
[place] LEVEL OF CARE FORM     IDENTIFICATION  Patient Name: Lance [name] Birthdate: 08-14-1932 Sex: male Admission Date (Current Location): 10/01/2022  [facility] and [place] Number:  Herbalist and Address:  [facility], [place] [address] [place], [place] [address]      Provider Number: [id number]  Attending Physician Name and Address:  Caren Griffins, MD  Relative Name and Phone Number:  Delane Wessinger [phone]    Current Level of Care: Hospital Recommended Level of Care: [place] Prior Approval Number:    Date Approved/Denied:   PASRR Number: Pending  Discharge Plan: SNF    Current Diagnoses: Patient Active Problem List   Diagnosis Date Noted   Femur fracture ([place]) 10/02/2022   Malnutrition of moderate degree 10/02/2022   Pes anserinus bursitis of left knee 03/01/2017   Primary osteoarthritis of right knee 02/15/2017   Dizziness 10/28/2016   Amnestic MCI (mild cognitive impairment with memory loss) 04/27/2016   Osteoarthritis of left knee 02/25/2016   S/P left unicompartmental knee replacement 02/25/2016   Spinal stenosis at L4-L5 level 11/11/2015   Aspiration pneumonia of left lower lobe due to gastric secretions ([place]) 10/22/2015   Bilateral leg weakness 10/22/2015   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   OSA treated with BiPAP 10/22/2015   Ambulatory dysfunction 10/22/2015   CVA (cerebral infarction) 06/03/2013   Syncope 06/03/2013   Facial laceration 06/03/2013   Acute bronchitis 06/03/2013   Hypertension     Orientation RESPIRATION BLADDER Height & Weight     Self  Normal Continent Weight: 199 lb 15.3 oz (90.7 kg) Height:  6' (182.9 cm)  BEHAVIORAL SYMPTOMS/MOOD NEUROLOGICAL BOWEL NUTRITION STATUS      Continent Diet (see dc summary)  AMBULATORY STATUS COMMUNICATION OF NEEDS Skin   Total Care Verbally Normal                       Personal Care Assistance Level of Assistance   Bathing, Feeding, Dressing Bathing Assistance: Maximum assistance Feeding assistance: Maximum assistance Dressing Assistance: Maximum assistance     Functional Limitations Info  Sight, Hearing, Speech Sight Info: Adequate Hearing Info: Adequate Speech Info: Adequate    SPECIAL CARE FACTORS FREQUENCY  PT (By licensed PT), OT (By licensed OT)     PT Frequency: 5x week OT Frequency: 5x week            Contractures Contractures Info: Not present    Additional Factors Info  Code Status, Allergies Code Status Info: DNR Allergies Info: Penicillins   Simvastatin           Current Medications (10/03/2022):  This is the current hospital active medication list Current Facility-Administered Medications  Medication Dose Route Frequency Provider Last Rate Last Admin   acetaminophen (TYLENOL) tablet 650 mg  650 mg Oral Q6H PRN Clance Boll, MD   650 mg at 10/02/22 0340   aspirin EC tablet 325 mg  325 mg Oral Daily Myles Rosenthal A, MD   325 mg at 10/03/22 0746   diazepam (VALIUM) tablet 10 mg  10 mg Oral Q8H PRN Clance Boll, MD   10 mg at 10/03/22 0740   feeding supplement (ENSURE ENLIVE / ENSURE PLUS) liquid 237 mL  237 mL Oral BID BM Caren Griffins, MD   237 mL at 10/02/22 1658   HYDROcodone-acetaminophen (NORCO/VICODIN) 5-325 MG per tablet 1-2 tablet  1-2 tablet Oral Q6H PRN Clance Boll, MD  1 tablet at 10/03/22 0740   irbesartan (AVAPRO) tablet 75 mg  75 mg Oral Daily Myles Rosenthal A, MD   75 mg at 10/03/22 0746   morphine (PF) 2 MG/ML injection 0.5 mg  0.5 mg Intravenous Q2H PRN Myles Rosenthal A, MD   0.5 mg at 10/03/22 1048   multivitamin with minerals tablet 1 tablet  1 tablet Oral Daily Caren Griffins, MD   1 tablet at 10/03/22 0746   traMADol (ULTRAM) tablet 50 mg  50 mg Oral Q8H PRN Clance Boll, MD         Discharge Medications: Please see discharge summary for a list of discharge medications.  Relevant Imaging  Results:  Relevant Lab Results:   Additional Information SSN [id number]  Loreta Ave, LCSWA

## 2022-10-04 DIAGNOSIS — S7290XD Unspecified fracture of unspecified femur, subsequent encounter for closed fracture with routine healing: Secondary | ICD-10-CM | POA: Diagnosis not present

## 2022-10-04 MED ORDER — POTASSIUM CHLORIDE CRYS ER 20 MEQ PO TBCR: 40.0000 meq | EXTENDED_RELEASE_TABLET | Freq: Once | ORAL | Status: DC

## 2022-10-04 MED ORDER — MELATONIN 5 MG PO TABS
5.0000 mg | ORAL_TABLET | Freq: Once | ORAL | Status: AC
  Administered 2022-10-04: 5 mg via ORAL
  Filled 2022-10-04: qty 1

## 2022-10-04 NOTE — Progress Notes (Signed)
PROGRESS NOTE  Lance [name] OHY:[id number] DOB: 10/30/1931 DOA: 10/01/2022 PCP: Crist Infante, MD   LOS: 2 days   Brief Narrative / Interim history: 89 year old male with dementia, OSA, HTN, HLD who with progressive weakness over several months, with a fall at home and left leg pain. Imaging was concerning for undisplaced fracture in the medial metaphysis of the distal left femur. Ortho consulted and he was admitted to the hospital   Subjective / 24h Interval events: Sitting up in bed, no complaints.  Wife is at bedside feeding him.  Assesement and Plan: Principal problem Undisplaced fracture in the medial metaphysis of the distal left - s/p mechanical fall.  Orthopedic surgery consulted, appreciate input.  Discussed with Dr. Ninfa Linden, he evaluated patient on 1/5 and recommends nonoperative management.  PT recommends SNF, placement pending he will need to be touchdown weightbearing for the next 6 to 8 weeks as the fracture heals  Active problems OSA - on CPAP   HLD - diet controlled    Mild Cognitive Impairment - stable    HTN - resume ARB   BPH -resume home medications today   PTSD/Depression/Anxiety -resume home medications today    Scheduled Meds:  aspirin EC  325 mg Oral Daily   feeding supplement  237 mL Oral BID BM   irbesartan  75 mg Oral Daily   multivitamin with minerals  1 tablet Oral Daily   Continuous Infusions: PRN Meds:.acetaminophen, diazepam, HYDROcodone-acetaminophen, morphine injection, traMADol  Current Outpatient Medications  Medication Instructions   acetaminophen (TYLENOL) 325-650 mg, Oral, As needed   aspirin EC 325 mg, Oral, Daily   bisacodyl (BISACODYL) 5 mg, Oral, Daily PRN   buPROPion (WELLBUTRIN SR) 150 mg, Oral, 2 times daily   celecoxib (CELEBREX) 200 mg, Oral, Daily   diazepam (VALIUM) 10 mg, Oral, 2 times daily   dorzolamide-timolol (COSOPT) 2-0.5 % ophthalmic solution 1 drop, 2 times daily   doxepin (SINEQUAN) 75 mg, Oral, Daily  at bedtime   ezetimibe (ZETIA) 10 mg, Daily   finasteride (PROSCAR) 5 mg, Oral, Daily   HYDROcodone-acetaminophen (NORCO/VICODIN) 5-325 MG tablet 2 tablets, Oral, Every 8 hours PRN   lidocaine (LIDODERM) 5 % 1 patch, Transdermal, Every 24 hours, Remove & Discard patch within 12 hours or as directed by MD   mirabegron ER (MYRBETRIQ) 50 mg, Oral, Every evening   oxyCODONE-acetaminophen (PERCOCET/ROXICET) 5-325 MG tablet 1 tablet, Oral, Every 6 hours PRN   tamsulosin (FLOMAX) 0.4 mg, Oral, Daily   timolol (TIMOPTIC) 0.5 % ophthalmic solution 1 drop, Both Eyes, 2 times daily   traMADol (ULTRAM) 50 MG tablet Every 8 hours PRN   valsartan (DIOVAN) 40 mg, Oral, 2 times daily   Vitamin D (Ergocalciferol) (DRISDOL) 50,000 Units, Oral, Weekly    Diet Orders (From admission, onward)     Start     Ordered   10/02/22 0923  Diet regular Fluid consistency: Thin  Diet effective now       Question:  Fluid consistency:  Answer:  Thin   10/02/22 0922            DVT prophylaxis: SCDs Start: 10/02/22 0450   Lab Results  Component Value Date   PLT 216 10/03/2022      Code Status: DNR  Family Communication: wife at bedside   Status is: Inpatient  Remains inpatient appropriate because: severity of illness  Level of care: Med-Surg  Consultants:  Orthopedic surgery   Objective: Vitals:   10/04/22 9485 10/04/22 0740 10/04/22 1207 10/04/22 1439  BP: (!) 140/79  (!) 148/105 132/84  Pulse: 71  79   Resp: '16 17 18 18  '$ Temp:  98.7 F (37.1 C) 98.7 F (37.1 C) 98.7 F (37.1 C)  TempSrc:  Oral Oral Oral  SpO2: 93%  92% 93%  Weight:      Height:        Intake/Output Summary (Last 24 hours) at 10/04/2022 1507 Last data filed at 10/03/2022 1600 Gross per 24 hour  Intake 237 ml  Output 600 ml  Net -363 ml    Wt Readings from Last 3 Encounters:  10/01/22 90.7 kg  02/01/22 90.7 kg  11/29/21 97 kg    Examination:  Constitutional: NAD Eyes: lids and conjunctivae normal, no scleral  icterus ENMT: mmm Neck: normal, supple Respiratory: clear to auscultation bilaterally, no wheezing, no crackles. Normal respiratory effort.  Cardiovascular: Regular rate and rhythm, no murmurs / rubs / gallops. No LE edema. Abdomen: soft, no distention, no tenderness. Bowel sounds positive.  Skin: no rashes Neurologic: no focal deficits, equal strength  Data Reviewed: I have independently reviewed following labs and imaging studies   CBC Recent Labs  Lab 10/01/22 1530 10/03/22 0345  WBC 9.7 10.2  HGB 13.8 13.9  HCT 43.6 40.5  PLT 230 216  MCV 98.9 94.2  MCH 31.3 32.3  MCHC 31.7 34.3  RDW 12.9 13.0     Recent Labs  Lab 10/01/22 1530 10/03/22 0345  NA 139 140  K 4.0 3.3*  CL 106 108  CO2 24 22  GLUCOSE 118* 104*  BUN 18 8  CREATININE 0.96 0.74  CALCIUM 8.7* 8.6*  MG  --  2.0     ------------------------------------------------------------------------------------------------------------------ No results for input(s): "CHOL", "HDL", "LDLCALC", "TRIG", "CHOLHDL", "LDLDIRECT" in the last 72 hours.  Lab Results  Component Value Date   HGBA1C 5.4 06/04/2013   ------------------------------------------------------------------------------------------------------------------ No results for input(s): "TSH", "T4TOTAL", "T3FREE", "THYROIDAB" in the last 72 hours.  Invalid input(s): "FREET3"  Cardiac Enzymes No results for input(s): "CKMB", "TROPONINI", "MYOGLOBIN" in the last 168 hours.  Invalid input(s): "CK" ------------------------------------------------------------------------------------------------------------------ No results found for: "BNP"  CBG: No results for input(s): "GLUCAP" in the last 168 hours.  No results found for this or any previous visit (from the past 240 hour(s)).   Radiology Studies: No results found.   Marzetta Board, MD, PhD [facility]  Between 7 am - 7 pm I am available, please contact me via Amion (for emergencies) or  Securechat (non urgent messages)  Between 7 pm - 7 am I am not available, please contact night coverage MD/APP via Amion

## 2022-10-05 DIAGNOSIS — S7290XD Unspecified fracture of unspecified femur, subsequent encounter for closed fracture with routine healing: Secondary | ICD-10-CM | POA: Diagnosis not present

## 2022-10-05 MED ORDER — HYDRALAZINE HCL 20 MG/ML IJ SOLN
5.0000 mg | Freq: Once | INTRAMUSCULAR | Status: AC
  Administered 2022-10-05: 5 mg via INTRAVENOUS
  Filled 2022-10-05: qty 1

## 2022-10-05 NOTE — Progress Notes (Signed)
RE:  Lance [name]      Date of Birth: 05/29/2032      Date:  10/05/22        To Whom It May Concern:  Please be advised that the above-named patient will require a short-term nursing home stay - anticipated 30 days or less for rehabilitation and strengthening.  The plan is for return home.                 MD signature                Date

## 2022-10-05 NOTE — Progress Notes (Signed)
Wife reports that pt. did not sleep last night. Noted pt. is agitated and pushes wife away. Response to voice with nonsense words and rambling.

## 2022-10-05 NOTE — Care Management Important Message (Signed)
Important Message  Patient Details  Name: Lance [name] MRN: [id number] Date of Birth: Nov 23, 1931   Medicare Important Message Given:  Yes     Mazal Ebey [address] 10/05/2022, 3:34 PM

## 2022-10-05 NOTE — Progress Notes (Signed)
Physical Therapy Treatment Patient Details Name: Lance [name] MRN: [id number] DOB: 1932/03/06 Today's Date: 10/05/2022   History of Present Illness Pt is an 89 y/o M admitted on 10/01/22 after presenting with c/o progressive weakness over months & fall resulting in LLE pain. Pt found to have undisplaced fracture in the medial metaphysis of the distal left femur. MD initially recommended KI and WBAT LLE for transfers only, otherwise TDWB. Note from 1/5 indicates KI discontinued. PMH: OSA on CPAP, HLD, HTN, depression, anxiety, glaucoma    PT Comments    Pt progressing towards physical therapy goals. Focus of session was transfer bed>chair and pt with overall good rehab effort throughout. Lift pad in room if needed for transition back to bed with nursing staff. SNF level rehab remains the most appropriate d/c disposition at this time. Will continue to follow.     Recommendations for follow up therapy are one component of a multi-disciplinary discharge planning process, led by the attending physician.  Recommendations may be updated based on patient status, additional functional criteria and insurance authorization.  Follow Up Recommendations  Skilled nursing-short term rehab (<3 hours/day) Can patient physically be transported by private vehicle: No   Assistance Recommended at Discharge Frequent or constant Supervision/Assistance  Patient can return home with the following Two people to help with walking and/or transfers;Two people to help with bathing/dressing/bathroom;Help with stairs or ramp for entrance;Assist for transportation;Assistance with feeding;Assistance with cooking/housework;Direct supervision/assist for financial management;Direct supervision/assist for medications management   Equipment Recommendations  None recommended by PT    Recommendations for Other Services       Precautions / Restrictions Precautions Precautions: Fall Restrictions Weight Bearing Restrictions:  Yes LLE Weight Bearing: Touchdown weight bearing Other Position/Activity Restrictions: WBAT LLE transfers only, otherwise TDWB     Mobility  Bed Mobility Overal bed mobility: Needs Assistance Bed Mobility: Supine to Sit     Supine to sit: Max assist, +2 for physical assistance, HOB elevated     General bed mobility comments: Bed pad assist utilized to pivot around to sitting EOB. Therapist supported LLE throughout transition to EOB. Increased time required for pt to gain/maintain sitting balance.    Transfers Overall transfer level: Needs assistance Equipment used: None Transfers: Bed to chair/wheelchair/BSC, Sit to/from Stand Sit to Stand: Max assist, +2 physical assistance     Squat pivot transfers: Max assist, +2 physical assistance     General transfer comment: Transfer to drop arm recliner. Pt requires heavy +2 assist to transition from bed to chair.    Ambulation/Gait               General Gait Details: Unable to progress to ambulation at this time.   Stairs             Wheelchair Mobility    Modified Rankin (Stroke Patients Only)       Balance Overall balance assessment: Needs assistance Sitting-balance support: Feet supported, Bilateral upper extremity supported Sitting balance-Leahy Scale: Poor     Standing balance support: Bilateral upper extremity supported, During functional activity Standing balance-Leahy Scale: Zero Standing balance comment: +2 assist required                            Cognition Arousal/Alertness: Lethargic Behavior During Therapy: WFL for tasks assessed/performed Overall Cognitive Status: History of cognitive impairments - at baseline  General Comments: Pt is oriented to the hospital & his name. Follows simple commands with extra time throughout session.        Exercises General Exercises - Lower Extremity Long Arc Quad: 10 reps, AAROM    General  Comments        Pertinent Vitals/Pain Pain Assessment Pain Assessment: Faces Faces Pain Scale: Hurts even more Pain Location: BLE knees Pain Descriptors / Indicators: Discomfort, Grimacing, Guarding Pain Intervention(s): Limited activity within patient's tolerance, Monitored during session, Repositioned    Home Living Family/patient expects to be discharged to:: Private residence Living Arrangements: Spouse/significant other;Children Available Help at Discharge: Family Type of Home: House Home Access: Stairs to enter Entrance Stairs-Rails: Psychiatric nurse of Steps: 3   Home Layout: One level Home Equipment: Conservation officer, nature (2 wheels);Cane - single point;Shower seat;BSC/3in1      Prior Function            PT Goals (current goals can now be found in the care plan section) Acute Rehab PT Goals Patient Stated Goal: get stronger PT Goal Formulation: With family Time For Goal Achievement: 10/16/22 Potential to Achieve Goals: Fair Progress towards PT goals: Progressing toward goals    Frequency    Min 2X/week      PT Plan Current plan remains appropriate    Co-evaluation              AM-PAC PT "6 Clicks" Mobility   Outcome Measure  Help needed turning from your back to your side while in a flat bed without using bedrails?: Total Help needed moving from lying on your back to sitting on the side of a flat bed without using bedrails?: Total Help needed moving to and from a bed to a chair (including a wheelchair)?: Total Help needed standing up from a chair using your arms (e.g., wheelchair or bedside chair)?: Total Help needed to walk in hospital room?: Total Help needed climbing 3-5 steps with a railing? : Total 6 Click Score: 6    End of Session Equipment Utilized During Treatment: Gait belt Activity Tolerance: Patient tolerated treatment well Patient left: in chair;with call bell/phone within reach;with family/visitor present Nurse  Communication: Mobility status PT Visit Diagnosis: Difficulty in walking, not elsewhere classified (R26.2);Muscle weakness (generalized) (M62.81);Pain Pain - Right/Left:  (bilateral) Pain - part of body: Leg     Time: 8101-7510 PT Time Calculation (min) (ACUTE ONLY): 34 min  Charges:  $Gait Training: 8-22 mins $Therapeutic Activity: 8-22 mins                     Rolinda Roan, PT, DPT Acute Rehabilitation Services Secure Chat Preferred Office: [phone]    Thelma Comp 10/05/2022, 11:19 AM

## 2022-10-05 NOTE — Plan of Care (Signed)
  Problem: Health Behavior/Discharge Planning: Goal: Ability to manage health-related needs will improve Outcome: Progressing   Problem: Clinical Measurements: Goal: Cardiovascular complication will be avoided Outcome: Progressing   Problem: Activity: Goal: Risk for activity intolerance will decrease Outcome: Progressing   Problem: Nutrition: Goal: Adequate nutrition will be maintained Outcome: Progressing

## 2022-10-05 NOTE — TOC Progression Note (Addendum)
Transition of Care [facility]) - Progression Note    Patient Details  Name: Lance [name] MRN: [id number] Date of Birth: 1932-05-29  Transition of [facility]) CM/SW Contact  Joanne Chars, LCSW Phone Number: 10/05/2022, 10:25 AM  Clinical Narrative:   Bed offers and medicare choice document provided to pt wife and son.  They will review.   1345: TC with pt wife Marcie Bal.  They have visited [place] and [place], do not want to accept either offer.  Asked for response from [place] and [place].  CSW reached out to those facilities.    Expected Discharge Plan: [place] Barriers to Discharge: Continued Medical Work up  Expected Discharge Plan and Services In-house Referral: Clinical Social Work     Living arrangements for the past 2 months: Single Family Home                                       Social Determinants of Health (SDOH) Interventions [place]: No Food Insecurity (10/02/2022)  Housing: Low Risk  (10/02/2022)  Transportation Needs: No Transportation Needs (10/02/2022)  Utilities: Not At Risk (10/02/2022)  Tobacco Use: Medium Risk (10/01/2022)    Readmission Risk Interventions     No data to display

## 2022-10-05 NOTE — Progress Notes (Signed)
PROGRESS NOTE  Lance [name] UPJ:[id number] DOB: Jul 17, 1932 DOA: 10/01/2022 PCP: Crist Infante, MD   LOS: 3 days   Brief Narrative / Interim history: 89 year old male with dementia, OSA, HTN, HLD who with progressive weakness over several months, with a fall at home and left leg pain. Imaging was concerning for undisplaced fracture in the medial metaphysis of the distal left femur. Ortho consulted and he was admitted to the hospital   Subjective / 24h Interval events: Complains of a headache. No significant events overnight. No abdominal pain, no nausea/vomiting.   Assesement and Plan: Principal problem Undisplaced fracture in the medial metaphysis of the distal left - s/p mechanical fall.  Orthopedic surgery consulted, appreciate input.  Discussed with Dr. Ninfa Linden, he evaluated patient on 1/5 and recommends nonoperative management.  PT recommends SNF, placement pending he will need to be touchdown weightbearing for the next 6 to 8 weeks as the fracture heals -awaiting placement  Active problems OSA - on CPAP   HLD - diet controlled    Mild Cognitive Impairment - stable    HTN - resume ARB   BPH -resume home medications today   PTSD/Depression/Anxiety -resume home medications today    Scheduled Meds:  aspirin EC  325 mg Oral Daily   feeding supplement  237 mL Oral BID BM   irbesartan  75 mg Oral Daily   multivitamin with minerals  1 tablet Oral Daily   Continuous Infusions: PRN Meds:.acetaminophen, diazepam, HYDROcodone-acetaminophen, morphine injection, traMADol  Current Outpatient Medications  Medication Instructions   acetaminophen (TYLENOL) 325-650 mg, Oral, As needed   aspirin EC 325 mg, Oral, Daily   bisacodyl (BISACODYL) 5 mg, Oral, Daily PRN   buPROPion (WELLBUTRIN SR) 150 mg, Oral, 2 times daily   celecoxib (CELEBREX) 200 mg, Oral, Daily   diazepam (VALIUM) 10 mg, Oral, 2 times daily   dorzolamide-timolol (COSOPT) 2-0.5 % ophthalmic solution 1 drop, 2  times daily   doxepin (SINEQUAN) 75 mg, Oral, Daily at bedtime   ezetimibe (ZETIA) 10 mg, Daily   finasteride (PROSCAR) 5 mg, Oral, Daily   HYDROcodone-acetaminophen (NORCO/VICODIN) 5-325 MG tablet 2 tablets, Oral, Every 8 hours PRN   lidocaine (LIDODERM) 5 % 1 patch, Transdermal, Every 24 hours, Remove & Discard patch within 12 hours or as directed by MD   mirabegron ER (MYRBETRIQ) 50 mg, Oral, Every evening   oxyCODONE-acetaminophen (PERCOCET/ROXICET) 5-325 MG tablet 1 tablet, Oral, Every 6 hours PRN   tamsulosin (FLOMAX) 0.4 mg, Oral, Daily   timolol (TIMOPTIC) 0.5 % ophthalmic solution 1 drop, Both Eyes, 2 times daily   traMADol (ULTRAM) 50 MG tablet Every 8 hours PRN   valsartan (DIOVAN) 40 mg, Oral, 2 times daily   Vitamin D (Ergocalciferol) (DRISDOL) 50,000 Units, Oral, Weekly    Diet Orders (From admission, onward)     Start     Ordered   10/02/22 0923  Diet regular Fluid consistency: Thin  Diet effective now       Question:  Fluid consistency:  Answer:  Thin   10/02/22 0922            DVT prophylaxis: SCDs Start: 10/02/22 0450   Lab Results  Component Value Date   PLT 216 10/03/2022      Code Status: DNR  Family Communication: wife at bedside   Status is: Inpatient  Remains inpatient appropriate because: severity of illness  Level of care: Med-Surg  Consultants:  Orthopedic surgery   Objective: Vitals:   10/05/22 9292 10/05/22 4462 10/05/22 8638  10/05/22 0838  BP: (!) 194/86 (!) 179/80 (!) 172/84 (!) 186/91  Pulse: 78   87  Resp: 16   18  Temp: 98.2 F (36.8 C)   98 F (36.7 C)  TempSrc: Oral   Oral  SpO2: 95%   96%  Weight:      Height:        Intake/Output Summary (Last 24 hours) at 10/05/2022 1354 Last data filed at 10/05/2022 0800 Gross per 24 hour  Intake 420 ml  Output 600 ml  Net -180 ml    Wt Readings from Last 3 Encounters:  10/01/22 90.7 kg  02/01/22 90.7 kg  11/29/21 97 kg    Examination:  Constitutional: NAD Eyes: lids  and conjunctivae normal, no scleral icterus ENMT: mmm Neck: normal, supple Respiratory: clear to auscultation bilaterally, no wheezing, no crackles. Normal respiratory effort.  Cardiovascular: Regular rate and rhythm, no murmurs / rubs / gallops. No LE edema. Abdomen: soft, no distention, no tenderness. Bowel sounds positive.   Data Reviewed: I have independently reviewed following labs and imaging studies   CBC Recent Labs  Lab 10/01/22 1530 10/03/22 0345  WBC 9.7 10.2  HGB 13.8 13.9  HCT 43.6 40.5  PLT 230 216  MCV 98.9 94.2  MCH 31.3 32.3  MCHC 31.7 34.3  RDW 12.9 13.0     Recent Labs  Lab 10/01/22 1530 10/03/22 0345  NA 139 140  K 4.0 3.3*  CL 106 108  CO2 24 22  GLUCOSE 118* 104*  BUN 18 8  CREATININE 0.96 0.74  CALCIUM 8.7* 8.6*  MG  --  2.0     ------------------------------------------------------------------------------------------------------------------ No results for input(s): "CHOL", "HDL", "LDLCALC", "TRIG", "CHOLHDL", "LDLDIRECT" in the last 72 hours.  Lab Results  Component Value Date   HGBA1C 5.4 06/04/2013   ------------------------------------------------------------------------------------------------------------------ No results for input(s): "TSH", "T4TOTAL", "T3FREE", "THYROIDAB" in the last 72 hours.  Invalid input(s): "FREET3"  Cardiac Enzymes No results for input(s): "CKMB", "TROPONINI", "MYOGLOBIN" in the last 168 hours.  Invalid input(s): "CK" ------------------------------------------------------------------------------------------------------------------ No results found for: "BNP"  CBG: No results for input(s): "GLUCAP" in the last 168 hours.  No results found for this or any previous visit (from the past 240 hour(s)).   Radiology Studies: No results found.   Marzetta Board, MD, PhD [facility]  Between 7 am - 7 pm I am available, please contact me via Amion (for emergencies) or Securechat (non urgent  messages)  Between 7 pm - 7 am I am not available, please contact night coverage MD/APP via Amion

## 2022-10-05 NOTE — Progress Notes (Signed)
Pt on room air and stable. Pt refuses CPAP QHS. Pt wife states pt unable to tolerate mask of any kind. Will continue to monitor

## 2022-10-06 MED ORDER — TAMSULOSIN HCL 0.4 MG PO CAPS
0.4000 mg | ORAL_CAPSULE | Freq: Every day | ORAL | Status: DC
  Administered 2022-10-06: 0.4 mg via ORAL
  Filled 2022-10-06 (×2): qty 1

## 2022-10-06 MED ORDER — DOXEPIN HCL 25 MG PO CAPS
75.0000 mg | ORAL_CAPSULE | Freq: Every day | ORAL | Status: DC
  Administered 2022-10-06: 75 mg via ORAL
  Filled 2022-10-06 (×2): qty 3

## 2022-10-06 MED ORDER — SENNOSIDES-DOCUSATE SODIUM 8.6-50 MG PO TABS
2.0000 | ORAL_TABLET | Freq: Once | ORAL | Status: AC
  Administered 2022-10-06: 2 via ORAL
  Filled 2022-10-06: qty 2

## 2022-10-06 MED ORDER — FINASTERIDE 5 MG PO TABS
5.0000 mg | ORAL_TABLET | Freq: Every day | ORAL | Status: DC
  Administered 2022-10-06: 5 mg via ORAL
  Filled 2022-10-06 (×2): qty 1

## 2022-10-06 MED ORDER — BUPROPION HCL ER (SR) 150 MG PO TB12
150.0000 mg | ORAL_TABLET | Freq: Two times a day (BID) | ORAL | Status: DC
  Administered 2022-10-06 (×2): 150 mg via ORAL
  Filled 2022-10-06 (×3): qty 1

## 2022-10-06 MED ORDER — MIRABEGRON ER 50 MG PO TB24
50.0000 mg | ORAL_TABLET | Freq: Every evening | ORAL | Status: DC
  Filled 2022-10-06 (×2): qty 1

## 2022-10-06 NOTE — Plan of Care (Signed)
  Problem: Activity: Goal: Risk for activity intolerance will decrease Outcome: Progressing   Problem: Nutrition: Goal: Adequate nutrition will be maintained Outcome: Progressing   Problem: Coping: Goal: Level of anxiety will decrease Outcome: Progressing   

## 2022-10-06 NOTE — TOC Progression Note (Signed)
Transition of Care [facility]) - Progression Note    Patient Details  Name: Lance [name] MRN: [id number] Date of Birth: Feb 19, 1932  [facility] of [facility]) CM/SW Contact  Sharin Mons, RN Phone Number: 10/06/2022, 10:48 AM  Clinical Narrative:     Per MD , wife now requesting residential hospice care. NCM spoke with pt's wife  @ bedside, pt disoriented. Wife states pt not eating, lethargic doesn't think pt will benefit from [facility] placement. Requesting residential hospice care. NCM offered choice and [facility] selected. Referral made with Sherrie/ [facility]. Sherrie to f/u with wife. TOC team will continue to assist and follow....   Expected Discharge Plan: [place] (vs residential hospice care) Barriers to Discharge: Continued Medical Work up  Expected Discharge Plan and Services In-house Referral: Clinical Social Work     Living arrangements for the past 2 months: Single Family Home                                       Social Determinants of Health (SDOH) Interventions [place]: No Food Insecurity (10/02/2022)  Housing: Low Risk  (10/02/2022)  Transportation Needs: No Transportation Needs (10/02/2022)  Utilities: Not At Risk (10/02/2022)  Tobacco Use: Medium Risk (10/01/2022)    Readmission Risk Interventions     No data to display

## 2022-10-06 NOTE — Progress Notes (Signed)
OT Cancellation Note/Discharge  Patient Details Name: Lance [name] MRN: [id number] DOB: Aug 04, 1932   Cancelled Treatment:    Reason Eval/Treat Not Completed: Other (comment) (Wife is requesting residential Hospice care for patient. Pt will be discharged from skilled OT services at this time.)  Ailene Ravel, OTR/L,CBIS  Supplemental OT - [facility] and [facility] Secure Chat Preferred   10/06/2022, 2:22 PM

## 2022-10-06 NOTE — TOC Progression Note (Addendum)
Transition of Care [facility]) - Progression Note    Patient Details  Name: Lance [name] MRN: [id number] Date of Birth: July 12, 1932  Transition of [facility]) CM/SW Contact  Joanne Chars, LCSW Phone Number: 10/06/2022, 8:36 AM  Clinical Narrative:   level 2 passr docs uploaded. Bed offer from [place], [place] request submitted in [place].   Expected Discharge Plan: [place] Barriers to Discharge: Continued Medical Work up  Expected Discharge Plan and Services In-house Referral: Clinical Social Work     Living arrangements for the past 2 months: Single Family Home                                       Social Determinants of Health (SDOH) Interventions [place]: No Food Insecurity (10/02/2022)  Housing: Low Risk  (10/02/2022)  Transportation Needs: No Transportation Needs (10/02/2022)  Utilities: Not At Risk (10/02/2022)  Tobacco Use: Medium Risk (10/01/2022)    Readmission Risk Interventions     No data to display

## 2022-10-06 NOTE — Discharge Summary (Signed)
Physician Discharge Summary  Lance [name] EXB:[id number] DOB: [birth year] DOA: 10/01/2022  PCP: Crist Infante, MD  Admit date: 10/01/2022 Discharge date: 10/06/2022  Admitted From: home Disposition:  residential hospice   Home Health: none Equipment/Devices: none  Discharge Condition: stable CODE STATUS: DNR Diet Orders (From admission, onward)     Start     Ordered   10/02/22 0923  Diet regular Fluid consistency: Thin  Diet effective now       Question:  Fluid consistency:  Answer:  Thin   10/02/22 1027            HPI: Per admitting MD, Lance [name] is a 89 y.o. male with medical history significant of  OSA on CPAP,,HLD, HTN , Depression/Anxeity ,who presents to ED with progressive weakness over months and fall and home with left leg pain. Patient currently notes pain under control. He notes no n/v/d/ sob/chest pain/fever/ chills/ or cough.   Hospital Course / Discharge diagnoses: Principal Problem:   Femur fracture ([place]) Active Problems:   Malnutrition of moderate degree   Principal problem Undisplaced fracture in the medial metaphysis of the distal left - s/p mechanical fall.  Orthopedic surgery consulted, appreciate input.  Discussed with Dr. Ninfa [name], he evaluated patient on 1/5 and recommends nonoperative management.  PT recommends SNF, however given significant decline in the last several weeks patient's wife electing patient to go to residential hospice.  Bed availability pending.     Active problems OSA HLD - diet controlled  Dementia-with progressive decline, less mobility, no recent fall.  He has been having worsening p.o. intake, after discussing goals of care patient's wife electing to go to residential hospice.  Bed availability pending HTN - comfort approach BPH -continue home meds PTSD/Depression/Anxiety - comfort approach  Sepsis ruled out   Discharge Instructions   Allergies as of 10/06/2022       Reactions   Penicillins  Anaphylaxis, Hives, Other (See Comments)   Has patient had a PCN reaction causing immediate rash, facial/tongue/throat swelling, SOB or lightheadedness with hypotension: Yes Has patient had a PCN reaction causing severe rash involving mucus membranes or skin necrosis: No Has patient had a PCN reaction that required hospitalization Yes Has patient had a PCN reaction occurring within the last 10 years: No If all of the above answers are "NO", then may proceed with Cephalosporin use.   Simvastatin Other (See Comments)   Leg weakness        Medication List     STOP taking these medications    aspirin EC 325 MG tablet   bisacodyl 5 MG EC tablet Generic drug: bisacodyl   buPROPion 150 MG 12 hr tablet Commonly known as: WELLBUTRIN SR   celecoxib 200 MG capsule Commonly known as: CELEBREX   dorzolamide-timolol 2-0.5 % ophthalmic solution Commonly known as: COSOPT   ezetimibe 10 MG tablet Commonly known as: ZETIA   oxyCODONE-acetaminophen 5-325 MG tablet Commonly known as: PERCOCET/ROXICET   traMADol 50 MG tablet Commonly known as: ULTRAM   valsartan 80 MG tablet Commonly known as: DIOVAN   Vitamin D (Ergocalciferol) 1.25 MG (50000 UNIT) Caps capsule Commonly known as: DRISDOL       TAKE these medications    acetaminophen 325 MG tablet Commonly known as: TYLENOL Take 325-650 mg by mouth as needed for moderate pain.   diazepam 10 MG tablet Commonly known as: VALIUM Take 10 mg by mouth in the morning and at bedtime.   doxepin 75 MG capsule Commonly known as: SINEQUAN Take  75 mg by mouth at bedtime.   finasteride 5 MG tablet Commonly known as: PROSCAR Take 5 mg by mouth daily.   HYDROcodone-acetaminophen 5-325 MG tablet Commonly known as: NORCO/VICODIN Take 2 tablets by mouth every 8 (eight) hours as needed for moderate pain.   lidocaine 5 % Commonly known as: Lidoderm Place 1 patch onto the skin daily. Remove & Discard patch within 12 hours or as directed  by MD What changed:  how much to take when to take this reasons to take this additional instructions   Myrbetriq 50 MG Tb24 tablet Generic drug: mirabegron ER Take 50 mg by mouth every evening.   tamsulosin 0.4 MG Caps capsule Commonly known as: FLOMAX Take 0.4 mg by mouth daily.   timolol 0.5 % ophthalmic solution Commonly known as: TIMOPTIC Place 1 drop into both eyes 2 (two) times daily.        Follow-up Information     Lance Rossetti, MD. Schedule an appointment as soon as possible for a visit in 2 week(s).   Specialty: Orthopedic Surgery Contact information: [address] [place] [address] [phone]                 Consultations: Orthopedic surgery   Procedures/Studies:  CT Knee Left Wo Contrast  Result Date: 10/01/2022 CLINICAL DATA:  Recent fall with left knee pain and possible metaphyseal fracture EXAM: CT OF THE LEFT KNEE WITHOUT CONTRAST TECHNIQUE: Multidetector CT imaging of the left knee was performed according to the standard protocol. Multiplanar CT image reconstructions were also generated. RADIATION DOSE REDUCTION: This exam was performed according to the departmental dose-optimization program which includes automated exposure control, adjustment of the mA and/or kV according to patient size and/or use of iterative reconstruction technique. COMPARISON:  Plain film from earlier in the same day. FINDINGS: Bones/Joint/Cartilage There is an undisplaced fracture in the medial metaphysis of the distal left femur. Osteophytic changes are noted laterally as well as in the patellofemoral space. Joint effusion is seen in the suprapatellar bursa. No other fractures are noted. Partial medial knee replacement is noted. Ligaments Suboptimally assessed by CT. Muscles and Tendons Surrounding musculature appears within normal limits. Soft tissues Diffuse vascular calcifications are noted. Joint effusion is noted as previously described. No other soft  tissue abnormality is seen. IMPRESSION: Undisplaced fracture in the medial metaphysis of the distal left femur consistent with that seen on prior plain film examination. Associated joint effusion is noted. Degenerative changes without acute abnormality. Electronically Signed   By: Inez Catalina M.D.   On: 10/01/2022 22:21   DG Knee Complete 4 Views Left  Result Date: 10/01/2022 CLINICAL DATA:  Bilateral knee pain EXAM: LEFT KNEE - COMPLETE 4+ VIEW COMPARISON:  01/08/2020 FINDINGS: Status post left medial hemiarthroplasty. Malalignment. Mild lateral degenerative change and moderate patellofemoral degenerative change. Small knee effusion. Vascular calcifications. Question cortex buckling at the distal metaphysis of the femur. IMPRESSION: 1. Status post left medial hemiarthroplasty. Mild lateral and moderate patellofemoral degenerative change with small knee effusion. 2. Question cortex buckling/fracture at the distal metaphysis of the femur. Electronically Signed   By: Donavan Foil M.D.   On: 10/01/2022 16:35   DG Knee Complete 4 Views Right  Result Date: 10/01/2022 CLINICAL DATA:  Knee pain EXAM: RIGHT KNEE - COMPLETE 4+ VIEW COMPARISON:  01/08/2020 FINDINGS: No fracture or malalignment. 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 No sizable effusion. 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 Electronically Signed  By: Donavan Foil M.D.   On: 10/01/2022 16:32   CT Lumbar Spine Wo Contrast  Result Date: 10/01/2022 CLINICAL DATA:  Back trauma EXAM: CT LUMBAR SPINE WITHOUT CONTRAST TECHNIQUE: Multidetector CT imaging of the lumbar spine was performed without intravenous contrast administration. Multiplanar CT image reconstructions were also generated. RADIATION DOSE REDUCTION: This exam was performed according to the departmental dose-optimization program which includes automated exposure control,  adjustment of the mA and/or kV according to patient size and/or use of iterative reconstruction technique. COMPARISON:  CT lumbar spine 07/21/2022 FINDINGS: Segmentation: Normal segmentation.  Lowest disc space L5-S1 Alignment: Normal sagittal alignment. Moderate levoscoliosis at L3-4. Vertebrae: Negative for fracture or mass Paraspinal and other soft tissues: Extensive atherosclerotic calcification in the aorta and iliac arteries. Infrarenal abdominal aorta measures 3.2 cm in diameter. Cholelithiasis.  No retroperitoneal adenopathy or mass. Disc levels: T12-L1: Negative for stenosis L1-2: Negative for stenosis L2-3: Disc and facet degeneration. Endplate spurring. Mild spinal stenosis and mild subarticular stenosis bilaterally L3-4: Disc and facet degeneration. Moderate subarticular stenosis on the right and mild subarticular stenosis on the left. Mild central canal stenosis L4-5: Decompressive laminectomy. Spinal canal adequate in diameter. Bilateral facet hypertrophy. Disc degeneration and diffuse endplate spurring. Severe subarticular and foraminal stenosis bilaterally L5-S1: Bilateral facet degeneration.  Negative for stenosis. IMPRESSION: 1. Negative for lumbar fracture. 2. Multilevel disc and facet degeneration. Mild spinal stenosis L2-3. Moderate subarticular stenosis on the right L3-4. 3. Decompressive laminectomy L4-5. Severe subarticular and foraminal stenosis bilaterally due to endplate spurring. 4. 3.2 cm abdominal aortic aneurysm. Recommend follow-up every 3 years. This recommendation follows ACR consensus guidelines: White Paper of the ACR Incidental Findings Committee II on Vascular Findings. J Am Coll Radiol 2013; 10:789-794. 5. Cholelithiasis. 6. Aortic atherosclerosis. Aortic Atherosclerosis (ICD10-I70.0). Electronically Signed   By: Franchot Gallo M.D.   On: 10/01/2022 15:52   CT HEAD WO CONTRAST  Result Date: 10/01/2022 CLINICAL DATA:  Altered mental status head and neck trauma EXAM: CT HEAD  WITHOUT CONTRAST CT CERVICAL SPINE WITHOUT CONTRAST TECHNIQUE: Multidetector CT imaging of the head and cervical spine was performed following the standard protocol without intravenous contrast. Multiplanar CT image reconstructions of the cervical spine were also generated. RADIATION DOSE REDUCTION: This exam was performed according to the departmental dose-optimization program which includes automated exposure control, adjustment of the mA and/or kV according to patient size and/or use of iterative reconstruction technique. COMPARISON:  None Available. FINDINGS: CT HEAD FINDINGS Brain: No evidence of acute infarction, hemorrhage, hydrocephalus, extra-axial collection or mass lesion/mass effect. Periventricular and deep white matter hypodensity. Vascular: No hyperdense vessel or unexpected calcification. Skull: Normal. Negative for fracture or focal lesion. Sinuses/Orbits: No acute finding. Other: None. CT CERVICAL SPINE FINDINGS Alignment: Normal. Skull base and vertebrae: No acute fracture. No primary bone lesion or focal pathologic process. Soft tissues and spinal canal: No prevertebral fluid or swelling. No visible canal hematoma. Disc levels: Moderate to severe multilevel disc space height loss and osteophytosis, as well as congenital ankylosis of C2-C3, disc degenerative disease most severe at C4-C5. Upper chest: Negative. Other: None. IMPRESSION: 1. No acute intracranial pathology. Small-vessel white matter disease. 2. No fracture or static subluxation of the cervical spine. 3. Moderate to severe multilevel cervical disc degenerative disease. Electronically Signed   By: Delanna Ahmadi M.D.   On: 10/01/2022 15:50   CT Cervical Spine Wo Contrast  Result Date: 10/01/2022 CLINICAL DATA:  Altered mental status head and neck trauma EXAM: CT HEAD WITHOUT CONTRAST CT CERVICAL SPINE  WITHOUT CONTRAST TECHNIQUE: Multidetector CT imaging of the head and cervical spine was performed following the standard protocol  without intravenous contrast. Multiplanar CT image reconstructions of the cervical spine were also generated. RADIATION DOSE REDUCTION: This exam was performed according to the departmental dose-optimization program which includes automated exposure control, adjustment of the mA and/or kV according to patient size and/or use of iterative reconstruction technique. COMPARISON:  None Available. FINDINGS: CT HEAD FINDINGS Brain: No evidence of acute infarction, hemorrhage, hydrocephalus, extra-axial collection or mass lesion/mass effect. Periventricular and deep white matter hypodensity. Vascular: No hyperdense vessel or unexpected calcification. Skull: Normal. Negative for fracture or focal lesion. Sinuses/Orbits: No acute finding. Other: None. CT CERVICAL SPINE FINDINGS Alignment: Normal. Skull base and vertebrae: No acute fracture. No primary bone lesion or focal pathologic process. Soft tissues and spinal canal: No prevertebral fluid or swelling. No visible canal hematoma. Disc levels: Moderate to severe multilevel disc space height loss and osteophytosis, as well as congenital ankylosis of C2-C3, disc degenerative disease most severe at C4-C5. Upper chest: Negative. Other: None. IMPRESSION: 1. No acute intracranial pathology. Small-vessel white matter disease. 2. No fracture or static subluxation of the cervical spine. 3. Moderate to severe multilevel cervical disc degenerative disease. Electronically Signed   By: Delanna Ahmadi M.D.   On: 10/01/2022 15:50     Subjective: No complaints. Appears tired  Discharge Exam: BP 128/63 (BP Location: Left Wrist)   Pulse 100   Temp 98.9 F (37.2 C) (Oral)   Resp 17   Ht 6' (1.829 m)   Wt 90.7 kg   SpO2 92%   BMI 27.12 kg/m   General: Pt is alert, awake, not in acute distress Cardiovascular: RRR, S1/S2 +, no rubs, no gallops Respiratory: CTA bilaterally, no wheezing, no rhonchi  The results of significant diagnostics from this hospitalization (including  imaging, microbiology, ancillary and laboratory) are listed below for reference.     Microbiology: No results found for this or any previous visit (from the past 240 hour(s)).   Labs: Basic Metabolic Panel: Recent Labs  Lab 10/01/22 1530 10/03/22 0345  NA 139 140  K 4.0 3.3*  CL 106 108  CO2 24 22  GLUCOSE 118* 104*  BUN 18 8  CREATININE 0.96 0.74  CALCIUM 8.7* 8.6*  MG  --  2.0   Liver Function Tests: No results for input(s): "AST", "ALT", "ALKPHOS", "BILITOT", "PROT", "ALBUMIN" in the last 168 hours. CBC: Recent Labs  Lab 10/01/22 1530 10/03/22 0345  WBC 9.7 10.2  HGB 13.8 13.9  HCT 43.6 40.5  MCV 98.9 94.2  PLT 230 216   CBG: No results for input(s): "GLUCAP" in the last 168 hours. Hgb A1c No results for input(s): "HGBA1C" in the last 72 hours. Lipid Profile No results for input(s): "CHOL", "HDL", "LDLCALC", "TRIG", "CHOLHDL", "LDLDIRECT" in the last 72 hours. Thyroid function studies No results for input(s): "TSH", "T4TOTAL", "T3FREE", "THYROIDAB" in the last 72 hours.  Invalid input(s): "FREET3" Urinalysis    Component Value Date/Time   COLORURINE YELLOW 10/01/2022 2112   APPEARANCEUR CLEAR 10/01/2022 2112   LABSPEC 1.019 10/01/2022 2112   PHURINE 5.0 10/01/2022 2112   GLUCOSEU NEGATIVE 10/01/2022 2112   HGBUR NEGATIVE 10/01/2022 2112   BILIRUBINUR NEGATIVE 10/01/2022 2112   KETONESUR 5 (A) 10/01/2022 2112   PROTEINUR NEGATIVE 10/01/2022 2112   UROBILINOGEN 0.2 06/03/2013 1033   NITRITE NEGATIVE 10/01/2022 2112   LEUKOCYTESUR NEGATIVE 10/01/2022 2112    FURTHER DISCHARGE INSTRUCTIONS:   Get Medicines reviewed and  adjusted: Please take all your medications with you for your next visit with your Primary MD   Laboratory/radiological data: Please request your Primary MD to go over all hospital tests and procedure/radiological results at the follow up, please ask your Primary MD to get all Hospital records sent to his/her office.   In some cases,  they will be blood work, cultures and biopsy results pending at the time of your discharge. Please request that your primary care M.D. goes through all the records of your hospital data and follows up on these results.   Also Note the following: If you experience worsening of your admission symptoms, develop shortness of breath, life threatening emergency, suicidal or homicidal thoughts you must seek medical attention immediately by calling 911 or calling your MD immediately  if symptoms less severe.   You must read complete instructions/literature along with all the possible adverse reactions/side effects for all the Medicines you take and that have been prescribed to you. Take any new Medicines after you have completely understood and accpet all the possible adverse reactions/side effects.    Do not drive when taking Pain medications or sleeping medications (Benzodaizepines)   Do not take more than prescribed Pain, Sleep and Anxiety Medications. It is not advisable to combine anxiety,sleep and pain medications without talking with your primary care practitioner   Special Instructions: If you have smoked or chewed Tobacco  in the last 2 yrs please stop smoking, stop any regular Alcohol  and or any Recreational drug use.   Wear Seat belts while driving.   Please note: You were cared for by a hospitalist during your hospital stay. Once you are discharged, your primary care physician will handle any further medical issues. Please note that NO REFILLS for any discharge medications will be authorized once you are discharged, as it is imperative that you return to your primary care physician (or establish a relationship with a primary care physician if you do not have one) for your post hospital discharge needs so that they can reassess your need for medications and monitor your lab values.  Time coordinating discharge: 35 minutes  SIGNED:  Marzetta Board, MD, PhD 10/06/2022, 3:35 PM

## 2022-10-07 DIAGNOSIS — S72492A Other fracture of lower end of left femur, initial encounter for closed fracture: Secondary | ICD-10-CM | POA: Diagnosis not present

## 2022-10-07 DIAGNOSIS — R531 Weakness: Secondary | ICD-10-CM

## 2022-10-07 DIAGNOSIS — F03B0 Unspecified dementia, moderate, without behavioral disturbance, psychotic disturbance, mood disturbance, and anxiety: Secondary | ICD-10-CM

## 2022-10-07 MED ORDER — BISACODYL 10 MG RE SUPP
10.0000 mg | Freq: Once | RECTAL | Status: DC
  Filled 2022-10-07: qty 1

## 2022-10-07 NOTE — Plan of Care (Signed)
  Problem: Activity: Goal: Risk for activity intolerance will decrease Outcome: Progressing   Problem: Coping: Goal: Level of anxiety will decrease Outcome: Progressing   Problem: Pain Managment: Goal: General experience of comfort will improve Outcome: Progressing   

## 2022-10-07 NOTE — TOC Progression Note (Signed)
Transition of Care [facility]) - Progression Note    Patient Details  Name: Lance [name] MRN: [id number] Date of Birth: [birth year]  Transition of [facility]) CM/SW Contact  Joanne Chars, [place] Phone Number: 10/07/2022, 11:39 AM  Clinical Narrative:   CSW spoke with Roxanne/[facility], who confirmed that they do have residential hospice bed available and are ready to receive pt whenever transport can be arranged.    Expected Discharge Plan: [place] (vs residential hospice care) Barriers to Discharge: Continued Medical Work up  Expected Discharge Plan and Services In-house Referral: Clinical Social Work     Living arrangements for the past 2 months: Single Family Home                                       Social Determinants of Health (SDOH) Interventions [place]: No Food Insecurity (10/02/2022)  Housing: Low Risk  (10/02/2022)  Transportation Needs: No Transportation Needs (10/02/2022)  Utilities: Not At Risk (10/02/2022)  Tobacco Use: Medium Risk (10/01/2022)    Readmission Risk Interventions     No data to display

## 2022-10-07 NOTE — Progress Notes (Signed)
Pt discharging to [facility].  Wife given a copy of instructions as well as  copy of instructions printed and sent with transporters for facility.  Pt d/c'd with belongings, transported by [facility].

## 2022-10-07 NOTE — TOC Transition Note (Signed)
Transition of Care [facility]) - CM/SW Discharge Note   Patient Details  Name: Lance [name] MRN: [id number] Date of Birth: [birth year]  [facility]) CM/SW Contact:  Joanne Chars, LCSW Phone Number: 10/07/2022, 12:40 PM   Clinical Narrative:   Pt discharging to [facility].  RN call report to [phone].    Final next level of care: [place] Barriers to Discharge: Barriers Resolved   Patient Goals and CMS Choice CMS Medicare.gov Compare Post Acute Care list provided to:: Patient Represenative (must comment) (Spouse)    Discharge Placement                Patient chooses bed at:  ([facility]) Patient to be transferred to facility by: [place] Name of family member notified: wife and son both in room Patient and family notified of of transfer: 10/07/22  Discharge Plan and Services Additional resources added to the After Visit Summary for   In-house Referral: Clinical Social Work                                   Social Determinants of Health ([place]) Interventions SDOH Screenings   Food Insecurity: No Food Insecurity (10/02/2022)  Housing: Low Risk  (10/02/2022)  Transportation Needs: No Transportation Needs (10/02/2022)  Utilities: Not At Risk (10/02/2022)  Tobacco Use: Medium Risk (10/01/2022)     Readmission Risk Interventions     No data to display

## 2022-10-07 NOTE — Progress Notes (Addendum)
PROGRESS NOTE  Lance [name] BTD:[id number] DOB: 1932/08/31 DOA: 10/01/2022 PCP: Crist Infante, MD   LOS: 5 days   Brief Narrative / Interim history: 89 year old male with dementia, OSA, HTN, HLD who with progressive weakness over several months, with a fall at home and left leg pain. Imaging was concerning for undisplaced fracture in the medial metaphysis of the distal left femur. Ortho consulted and he was admitted to the hospital.  Await bed at residential hospice.  Subjective / 24h Interval events: Appears comfortable but has not been eating much  Assesement and Plan: Principal problem Undisplaced fracture in the medial metaphysis of the distal left - s/p mechanical fall.  Orthopedic surgery consulted, appreciate input.  Per Dr. Ninfa Linden, he evaluated patient on 1/5 and recommends nonoperative management.  -plan is for residential hospice  OSA - on CPAP   HLD - diet controlled    Mild Cognitive Impairment -appears worse than baseline    HTN - resume ARB   BPH -resume home medications if able to take PO   PTSD/Depression/Anxiety  -resume home medications if able to take PO  Constipation -bowel regimen  Scheduled Meds:  aspirin EC  325 mg Oral Daily   bisacodyl  10 mg Rectal Once   buPROPion  150 mg Oral BID   doxepin  75 mg Oral QHS   feeding supplement  237 mL Oral BID BM   finasteride  5 mg Oral Daily   irbesartan  75 mg Oral Daily   mirabegron ER  50 mg Oral QPM   multivitamin with minerals  1 tablet Oral Daily   tamsulosin  0.4 mg Oral Daily   Continuous Infusions: PRN Meds:.acetaminophen, diazepam, HYDROcodone-acetaminophen, morphine injection, traMADol  Current Outpatient Medications  Medication Instructions   acetaminophen (TYLENOL) 325-650 mg, Oral, As needed   aspirin EC 325 mg, Oral, Daily   bisacodyl (BISACODYL) 5 mg, Oral, Daily PRN   buPROPion (WELLBUTRIN SR) 150 mg, Oral, 2 times daily   celecoxib (CELEBREX) 200 mg, Oral, Daily   diazepam  (VALIUM) 10 mg, Oral, 2 times daily   dorzolamide-timolol (COSOPT) 2-0.5 % ophthalmic solution 1 drop, 2 times daily   doxepin (SINEQUAN) 75 mg, Oral, Daily at bedtime   ezetimibe (ZETIA) 10 mg, Daily   finasteride (PROSCAR) 5 mg, Oral, Daily   HYDROcodone-acetaminophen (NORCO/VICODIN) 5-325 MG tablet 2 tablets, Oral, Every 8 hours PRN   lidocaine (LIDODERM) 5 % 1 patch, Transdermal, Every 24 hours, Remove & Discard patch within 12 hours or as directed by MD   mirabegron ER (MYRBETRIQ) 50 mg, Oral, Every evening   oxyCODONE-acetaminophen (PERCOCET/ROXICET) 5-325 MG tablet 1 tablet, Oral, Every 6 hours PRN   tamsulosin (FLOMAX) 0.4 mg, Oral, Daily   timolol (TIMOPTIC) 0.5 % ophthalmic solution 1 drop, Both Eyes, 2 times daily   traMADol (ULTRAM) 50 MG tablet Every 8 hours PRN   valsartan (DIOVAN) 40 mg, Oral, 2 times daily   Vitamin D (Ergocalciferol) (DRISDOL) 50,000 Units, Oral, Weekly    Diet Orders (From admission, onward)     Start     Ordered   10/02/22 0923  Diet regular Fluid consistency: Thin  Diet effective now       Question:  Fluid consistency:  Answer:  Thin   10/02/22 0922            DVT prophylaxis: SCDs Start: 10/02/22 0450   Lab Results  Component Value Date   PLT 216 10/03/2022      Code Status: DNR  Family Communication:  wife at bedside   Status is: Inpatient  Remains inpatient appropriate because: severity of illness  Level of care: Med-Surg  Consultants:  Orthopedic surgery   Objective: Vitals:   10/06/22 0917 10/06/22 1335 10/06/22 2132 10/07/22 0355  BP: (!) 161/94 128/63 (!) 142/86 (!) 101/59  Pulse: 95 100 100 82  Resp: '18 17 17 16  '$ Temp: 98 F (36.7 C) 98.9 F (37.2 C) 98 F (36.7 C) 98 F (36.7 C)  TempSrc:  Oral Oral Oral  SpO2: 94% 92% 95% 93%  Weight:      Height:        Intake/Output Summary (Last 24 hours) at 10/07/2022 1043 Last data filed at 10/07/2022 0827 Gross per 24 hour  Intake 468 ml  Output 800 ml  Net -332  ml   Wt Readings from Last 3 Encounters:  10/01/22 90.7 kg  02/01/22 90.7 kg  11/29/21 97 kg    Examination:   General: Appearance:     Overweight male in no acute distress     Lungs:      respirations unlabored  Heart:    Normal heart rate. Normal rhythm. No murmurs, rubs, or gallops.   MS:   All extremities are intact.   Neurologic:   sleeping     Data Reviewed: I have independently reviewed following labs and imaging studies   CBC Recent Labs  Lab 10/01/22 1530 10/03/22 0345  WBC 9.7 10.2  HGB 13.8 13.9  HCT 43.6 40.5  PLT 230 216  MCV 98.9 94.2  MCH 31.3 32.3  MCHC 31.7 34.3  RDW 12.9 13.0    Recent Labs  Lab 10/01/22 1530 10/03/22 0345  NA 139 140  K 4.0 3.3*  CL 106 108  CO2 24 22  GLUCOSE 118* 104*  BUN 18 8  CREATININE 0.96 0.74  CALCIUM 8.7* 8.6*  MG  --  2.0    ------------------------------------------------------------------------------------------------------------------ No results for input(s): "CHOL", "HDL", "LDLCALC", "TRIG", "CHOLHDL", "LDLDIRECT" in the last 72 hours.  Lab Results  Component Value Date   HGBA1C 5.4 06/04/2013   ------------------------------------------------------------------------------------------------------------------ No results for input(s): "TSH", "T4TOTAL", "T3FREE", "THYROIDAB" in the last 72 hours.  Invalid input(s): "FREET3"  Cardiac Enzymes No results for input(s): "CKMB", "TROPONINI", "MYOGLOBIN" in the last 168 hours.  Invalid input(s): "CK" ------------------------------------------------------------------------------------------------------------------ No results found for: "BNP"  CBG: No results for input(s): "GLUCAP" in the last 168 hours.  No results found for this or any previous visit (from the past 240 hour(s)).   Radiology Studies: No results found.   Eulogio Bear DO [facility]  Between 7 am - 7 pm I am available, please contact me via Amion (for emergencies) or  Securechat (non urgent messages)  Between 7 pm - 7 am I am not available, please contact night coverage MD/APP via Amion

## 2022-10-07 NOTE — Progress Notes (Signed)
[facility]: Bed offered and accepted. Consents have been sent and returned. Transport can be set up for [facility] [place] [place] [address] for first available. Nurse report number [phone]. Please update Roxanne at [phone] with transport details once they have been arranged. Thank you for the opportunity to serve this patient and family.

## 2022-10-07 NOTE — Discharge Summary (Signed)
Physician Discharge Summary  Lance [name] JME:[id number] DOB: [birth year] DOA: 10/01/2022  PCP: Crist Infante, MD  Admit date: 10/01/2022 Discharge date: 10/07/2022  Admitted From: home Disposition:  residential hospice   Home Health: none Equipment/Devices: none  Discharge Condition: terminal CODE STATUS: DNR Diet Orders (From admission, onward)     Start     Ordered   10/02/22 0923  Diet regular Fluid consistency: Thin  Diet effective now       Question:  Fluid consistency:  Answer:  Thin   10/02/22 2297            HPI: Per admitting MD, Lance [name] is a 89 y.o. male with medical history significant of  OSA on CPAP,,HLD, HTN , Depression/Anxeity ,who presents to ED with progressive weakness over months and fall and home with left leg pain. Patient currently notes pain under control. He notes no n/v/d/ sob/chest pain/fever/ chills/ or cough.   Hospital Course / Discharge diagnoses: Principal Problem:   Femur fracture ([place]) Active Problems:   Malnutrition of moderate degree   Principal problem Undisplaced fracture in the medial metaphysis of the distal left - s/p mechanical fall.  Orthopedic surgery consulted, appreciate input.  Discussed with Dr. Ninfa Linden, he evaluated patient on 1/5 and recommends nonoperative management.  PT recommends SNF, however given significant decline in the last several weeks patient's wife electing patient to go to residential hospice.       OSA HLD - diet controlled  Dementia-with progressive decline, less mobility, no recent fall.  He has been having worsening p.o. intake, after discussing goals of care patient's wife electing to go to residential hospice.  HTN - comfort approach BPH -continue home meds PTSD/Depression/Anxiety - comfort approach  Sepsis ruled out   Discharge Instructions   Allergies as of 10/07/2022       Reactions   Penicillins Anaphylaxis, Hives, Other (See Comments)   Has patient had a PCN  reaction causing immediate rash, facial/tongue/throat swelling, SOB or lightheadedness with hypotension: Yes Has patient had a PCN reaction causing severe rash involving mucus membranes or skin necrosis: No Has patient had a PCN reaction that required hospitalization Yes Has patient had a PCN reaction occurring within the last 10 years: No If all of the above answers are "NO", then may proceed with Cephalosporin use.   Simvastatin Other (See Comments)   Leg weakness        Medication List     STOP taking these medications    aspirin EC 325 MG tablet   bisacodyl 5 MG EC tablet Generic drug: bisacodyl   buPROPion 150 MG 12 hr tablet Commonly known as: WELLBUTRIN SR   celecoxib 200 MG capsule Commonly known as: CELEBREX   dorzolamide-timolol 2-0.5 % ophthalmic solution Commonly known as: COSOPT   ezetimibe 10 MG tablet Commonly known as: ZETIA   oxyCODONE-acetaminophen 5-325 MG tablet Commonly known as: PERCOCET/ROXICET   traMADol 50 MG tablet Commonly known as: ULTRAM   valsartan 80 MG tablet Commonly known as: DIOVAN   Vitamin D (Ergocalciferol) 1.25 MG (50000 UNIT) Caps capsule Commonly known as: DRISDOL       TAKE these medications    acetaminophen 325 MG tablet Commonly known as: TYLENOL Take 325-650 mg by mouth as needed for moderate pain.   diazepam 10 MG tablet Commonly known as: VALIUM Take 10 mg by mouth in the morning and at bedtime.   doxepin 75 MG capsule Commonly known as: SINEQUAN Take 75 mg by mouth at bedtime.  finasteride 5 MG tablet Commonly known as: PROSCAR Take 5 mg by mouth daily.   HYDROcodone-acetaminophen 5-325 MG tablet Commonly known as: NORCO/VICODIN Take 2 tablets by mouth every 8 (eight) hours as needed for moderate pain.   lidocaine 5 % Commonly known as: Lidoderm Place 1 patch onto the skin daily. Remove & Discard patch within 12 hours or as directed by MD What changed:  how much to take when to take  this reasons to take this additional instructions   Myrbetriq 50 MG Tb24 tablet Generic drug: mirabegron ER Take 50 mg by mouth every evening.   tamsulosin 0.4 MG Caps capsule Commonly known as: FLOMAX Take 0.4 mg by mouth daily.   timolol 0.5 % ophthalmic solution Commonly known as: TIMOPTIC Place 1 drop into both eyes 2 (two) times daily.        Follow-up Information     Mcarthur Rossetti, MD. Schedule an appointment as soon as possible for a visit in 2 week(s).   Specialty: Orthopedic Surgery Contact information: [address] [place] [address] [phone]                 Consultations: Orthopedic surgery   Procedures/Studies:  CT Knee Left Wo Contrast  Result Date: 10/01/2022 CLINICAL DATA:  Recent fall with left knee pain and possible metaphyseal fracture EXAM: CT OF THE LEFT KNEE WITHOUT CONTRAST TECHNIQUE: Multidetector CT imaging of the left knee was performed according to the standard protocol. Multiplanar CT image reconstructions were also generated. RADIATION DOSE REDUCTION: This exam was performed according to the departmental dose-optimization program which includes automated exposure control, adjustment of the mA and/or kV according to patient size and/or use of iterative reconstruction technique. COMPARISON:  Plain film from earlier in the same day. FINDINGS: Bones/Joint/Cartilage There is an undisplaced fracture in the medial metaphysis of the distal left femur. Osteophytic changes are noted laterally as well as in the patellofemoral space. Joint effusion is seen in the suprapatellar bursa. No other fractures are noted. Partial medial knee replacement is noted. Ligaments Suboptimally assessed by CT. Muscles and Tendons Surrounding musculature appears within normal limits. Soft tissues Diffuse vascular calcifications are noted. Joint effusion is noted as previously described. No other soft tissue abnormality is seen. IMPRESSION: Undisplaced  fracture in the medial metaphysis of the distal left femur consistent with that seen on prior plain film examination. Associated joint effusion is noted. Degenerative changes without acute abnormality. Electronically Signed   By: Inez Catalina M.D.   On: 10/01/2022 22:21   DG Knee Complete 4 Views Left  Result Date: 10/01/2022 CLINICAL DATA:  Bilateral knee pain EXAM: LEFT KNEE - COMPLETE 4+ VIEW COMPARISON:  01/08/2020 FINDINGS: Status post left medial hemiarthroplasty. Malalignment. Mild lateral degenerative change and moderate patellofemoral degenerative change. Small knee effusion. Vascular calcifications. Question cortex buckling at the distal metaphysis of the femur. IMPRESSION: 1. Status post left medial hemiarthroplasty. Mild lateral and moderate patellofemoral degenerative change with small knee effusion. 2. Question cortex buckling/fracture at the distal metaphysis of the femur. Electronically Signed   By: Donavan Foil M.D.   On: 10/01/2022 16:35   DG Knee Complete 4 Views Right  Result Date: 10/01/2022 CLINICAL DATA:  Knee pain EXAM: RIGHT KNEE - COMPLETE 4+ VIEW COMPARISON:  01/08/2020 FINDINGS: No fracture or malalignment. 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 No sizable effusion. 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 Electronically Signed   By: Madie Reno.D.  On: 10/01/2022 16:32   CT Lumbar Spine Wo Contrast  Result Date: 10/01/2022 CLINICAL DATA:  Back trauma EXAM: CT LUMBAR SPINE WITHOUT CONTRAST TECHNIQUE: Multidetector CT imaging of the lumbar spine was performed without intravenous contrast administration. Multiplanar CT image reconstructions were also generated. RADIATION DOSE REDUCTION: This exam was performed according to the departmental dose-optimization program which includes automated exposure control, adjustment of the mA and/or kV according to patient size  and/or use of iterative reconstruction technique. COMPARISON:  CT lumbar spine 07/21/2022 FINDINGS: Segmentation: Normal segmentation.  Lowest disc space L5-S1 Alignment: Normal sagittal alignment. Moderate levoscoliosis at L3-4. Vertebrae: Negative for fracture or mass Paraspinal and other soft tissues: Extensive atherosclerotic calcification in the aorta and iliac arteries. Infrarenal abdominal aorta measures 3.2 cm in diameter. Cholelithiasis.  No retroperitoneal adenopathy or mass. Disc levels: T12-L1: Negative for stenosis L1-2: Negative for stenosis L2-3: Disc and facet degeneration. Endplate spurring. Mild spinal stenosis and mild subarticular stenosis bilaterally L3-4: Disc and facet degeneration. Moderate subarticular stenosis on the right and mild subarticular stenosis on the left. Mild central canal stenosis L4-5: Decompressive laminectomy. Spinal canal adequate in diameter. Bilateral facet hypertrophy. Disc degeneration and diffuse endplate spurring. Severe subarticular and foraminal stenosis bilaterally L5-S1: Bilateral facet degeneration.  Negative for stenosis. IMPRESSION: 1. Negative for lumbar fracture. 2. Multilevel disc and facet degeneration. Mild spinal stenosis L2-3. Moderate subarticular stenosis on the right L3-4. 3. Decompressive laminectomy L4-5. Severe subarticular and foraminal stenosis bilaterally due to endplate spurring. 4. 3.2 cm abdominal aortic aneurysm. Recommend follow-up every 3 years. This recommendation follows ACR consensus guidelines: White Paper of the ACR Incidental Findings Committee II on Vascular Findings. J Am Coll Radiol 2013; 10:789-794. 5. Cholelithiasis. 6. Aortic atherosclerosis. Aortic Atherosclerosis (ICD10-I70.0). Electronically Signed   By: Franchot Gallo M.D.   On: 10/01/2022 15:52   CT HEAD WO CONTRAST  Result Date: 10/01/2022 CLINICAL DATA:  Altered mental status head and neck trauma EXAM: CT HEAD WITHOUT CONTRAST CT CERVICAL SPINE WITHOUT CONTRAST  TECHNIQUE: Multidetector CT imaging of the head and cervical spine was performed following the standard protocol without intravenous contrast. Multiplanar CT image reconstructions of the cervical spine were also generated. RADIATION DOSE REDUCTION: This exam was performed according to the departmental dose-optimization program which includes automated exposure control, adjustment of the mA and/or kV according to patient size and/or use of iterative reconstruction technique. COMPARISON:  None Available. FINDINGS: CT HEAD FINDINGS Brain: No evidence of acute infarction, hemorrhage, hydrocephalus, extra-axial collection or mass lesion/mass effect. Periventricular and deep white matter hypodensity. Vascular: No hyperdense vessel or unexpected calcification. Skull: Normal. Negative for fracture or focal lesion. Sinuses/Orbits: No acute finding. Other: None. CT CERVICAL SPINE FINDINGS Alignment: Normal. Skull base and vertebrae: No acute fracture. No primary bone lesion or focal pathologic process. Soft tissues and spinal canal: No prevertebral fluid or swelling. No visible canal hematoma. Disc levels: Moderate to severe multilevel disc space height loss and osteophytosis, as well as congenital ankylosis of C2-C3, disc degenerative disease most severe at C4-C5. Upper chest: Negative. Other: None. IMPRESSION: 1. No acute intracranial pathology. Small-vessel white matter disease. 2. No fracture or static subluxation of the cervical spine. 3. Moderate to severe multilevel cervical disc degenerative disease. Electronically Signed   By: Delanna Ahmadi M.D.   On: 10/01/2022 15:50   CT Cervical Spine Wo Contrast  Result Date: 10/01/2022 CLINICAL DATA:  Altered mental status head and neck trauma EXAM: CT HEAD WITHOUT CONTRAST CT CERVICAL SPINE WITHOUT CONTRAST TECHNIQUE: Multidetector CT imaging of  the head and cervical spine was performed following the standard protocol without intravenous contrast. Multiplanar CT image  reconstructions of the cervical spine were also generated. RADIATION DOSE REDUCTION: This exam was performed according to the departmental dose-optimization program which includes automated exposure control, adjustment of the mA and/or kV according to patient size and/or use of iterative reconstruction technique. COMPARISON:  None Available. FINDINGS: CT HEAD FINDINGS Brain: No evidence of acute infarction, hemorrhage, hydrocephalus, extra-axial collection or mass lesion/mass effect. Periventricular and deep white matter hypodensity. Vascular: No hyperdense vessel or unexpected calcification. Skull: Normal. Negative for fracture or focal lesion. Sinuses/Orbits: No acute finding. Other: None. CT CERVICAL SPINE FINDINGS Alignment: Normal. Skull base and vertebrae: No acute fracture. No primary bone lesion or focal pathologic process. Soft tissues and spinal canal: No prevertebral fluid or swelling. No visible canal hematoma. Disc levels: Moderate to severe multilevel disc space height loss and osteophytosis, as well as congenital ankylosis of C2-C3, disc degenerative disease most severe at C4-C5. Upper chest: Negative. Other: None. IMPRESSION: 1. No acute intracranial pathology. Small-vessel white matter disease. 2. No fracture or static subluxation of the cervical spine. 3. Moderate to severe multilevel cervical disc degenerative disease. Electronically Signed   By: Delanna Ahmadi M.D.   On: 10/01/2022 15:50     Subjective: No complaints. Appears tired  Discharge Exam: BP (!) 101/59 (BP Location: Left Wrist)   Pulse 82   Temp 98 F (36.7 C) (Oral)   Resp 16   Ht 6' (1.829 m)   Wt 90.7 kg   SpO2 93%   BMI 27.12 kg/m   General: Pt is alert, awake, not in acute distress Cardiovascular: RRR, S1/S2 +, no rubs, no gallops Respiratory: CTA bilaterally, no wheezing, no rhonchi  The results of significant diagnostics from this hospitalization (including imaging, microbiology, ancillary and laboratory) are  listed below for reference.     Microbiology: No results found for this or any previous visit (from the past 240 hour(s)).   Labs: Basic Metabolic Panel: Recent Labs  Lab 10/01/22 1530 10/03/22 0345  NA 139 140  K 4.0 3.3*  CL 106 108  CO2 24 22  GLUCOSE 118* 104*  BUN 18 8  CREATININE 0.96 0.74  CALCIUM 8.7* 8.6*  MG  --  2.0   Liver Function Tests: No results for input(s): "AST", "ALT", "ALKPHOS", "BILITOT", "PROT", "ALBUMIN" in the last 168 hours. CBC: Recent Labs  Lab 10/01/22 1530 10/03/22 0345  WBC 9.7 10.2  HGB 13.8 13.9  HCT 43.6 40.5  MCV 98.9 94.2  PLT 230 216   CBG: No results for input(s): "GLUCAP" in the last 168 hours. Hgb A1c No results for input(s): "HGBA1C" in the last 72 hours. Lipid Profile No results for input(s): "CHOL", "HDL", "LDLCALC", "TRIG", "CHOLHDL", "LDLDIRECT" in the last 72 hours. Thyroid function studies No results for input(s): "TSH", "T4TOTAL", "T3FREE", "THYROIDAB" in the last 72 hours.  Invalid input(s): "FREET3" Urinalysis    Component Value Date/Time   COLORURINE YELLOW 10/01/2022 2112   APPEARANCEUR CLEAR 10/01/2022 2112   LABSPEC 1.019 10/01/2022 2112   PHURINE 5.0 10/01/2022 2112   GLUCOSEU NEGATIVE 10/01/2022 2112   HGBUR NEGATIVE 10/01/2022 2112   BILIRUBINUR NEGATIVE 10/01/2022 2112   KETONESUR 5 (A) 10/01/2022 2112   PROTEINUR NEGATIVE 10/01/2022 2112   UROBILINOGEN 0.2 06/03/2013 1033   NITRITE NEGATIVE 10/01/2022 2112   LEUKOCYTESUR NEGATIVE 10/01/2022 2112    FURTHER DISCHARGE INSTRUCTIONS:   Get Medicines reviewed and adjusted: Please take all your medications  with you for your next visit with your Primary MD   Laboratory/radiological data: Please request your Primary MD to go over all hospital tests and procedure/radiological results at the follow up, please ask your Primary MD to get all Hospital records sent to his/her office.   In some cases, they will be blood work, cultures and biopsy results  pending at the time of your discharge. Please request that your primary care M.D. goes through all the records of your hospital data and follows up on these results.   Also Note the following: If you experience worsening of your admission symptoms, develop shortness of breath, life threatening emergency, suicidal or homicidal thoughts you must seek medical attention immediately by calling 911 or calling your MD immediately  if symptoms less severe.   You must read complete instructions/literature along with all the possible adverse reactions/side effects for all the Medicines you take and that have been prescribed to you. Take any new Medicines after you have completely understood and accpet all the possible adverse reactions/side effects.    Do not drive when taking Pain medications or sleeping medications (Benzodaizepines)   Do not take more than prescribed Pain, Sleep and Anxiety Medications. It is not advisable to combine anxiety,sleep and pain medications without talking with your primary care practitioner   Special Instructions: If you have smoked or chewed Tobacco  in the last 2 yrs please stop smoking, stop any regular Alcohol  and or any Recreational drug use.   Wear Seat belts while driving.   Please note: You were cared for by a hospitalist during your hospital stay. Once you are discharged, your primary care physician will handle any further medical issues. Please note that NO REFILLS for any discharge medications will be authorized once you are discharged, as it is imperative that you return to your primary care physician (or establish a relationship with a primary care physician if you do not have one) for your post hospital discharge needs so that they can reassess your need for medications and monitor your lab values.  Time coordinating discharge: 35 minutes  SIGNED:  Geradine Girt, DO 10/07/2022, 12:07 PM
